# Patient Record
Sex: Female | Born: 1945
Health system: Southern US, Community
[De-identification: ages and names within clinical notes are randomized; demographics above are authoritative.]

## PROBLEM LIST (undated history)

## (undated) DIAGNOSIS — Z9889 Other specified postprocedural states: Secondary | ICD-10-CM

## (undated) DIAGNOSIS — E7800 Pure hypercholesterolemia, unspecified: Secondary | ICD-10-CM

## (undated) DIAGNOSIS — D219 Benign neoplasm of connective and other soft tissue, unspecified: Secondary | ICD-10-CM

## (undated) DIAGNOSIS — R351 Nocturia: Secondary | ICD-10-CM

## (undated) DIAGNOSIS — E079 Disorder of thyroid, unspecified: Secondary | ICD-10-CM

## (undated) DIAGNOSIS — C801 Malignant (primary) neoplasm, unspecified: Secondary | ICD-10-CM

## (undated) DIAGNOSIS — Z972 Presence of dental prosthetic device (complete) (partial): Secondary | ICD-10-CM

## (undated) DIAGNOSIS — D649 Anemia, unspecified: Secondary | ICD-10-CM

## (undated) DIAGNOSIS — I10 Essential (primary) hypertension: Secondary | ICD-10-CM

## (undated) DIAGNOSIS — R112 Nausea with vomiting, unspecified: Secondary | ICD-10-CM

## (undated) HISTORY — DX: Pure hypercholesterolemia, unspecified: E78.00

## (undated) HISTORY — PX: STRABISMUS SURGERY: SHX218

## (undated) HISTORY — DX: Disorder of thyroid, unspecified: E07.9

## (undated) HISTORY — DX: Anemia, unspecified: D64.9

## (undated) HISTORY — PX: DILATION AND CURETTAGE OF UTERUS: SHX78

---

## 1972-10-18 HISTORY — PX: CHOLECYSTECTOMY OPEN: SUR202

## 2004-08-28 ENCOUNTER — Encounter: Admission: RE | Admit: 2004-08-28 | Discharge: 2004-08-28 | Payer: Self-pay | Admitting: Family Medicine

## 2004-11-26 ENCOUNTER — Other Ambulatory Visit: Admission: RE | Admit: 2004-11-26 | Discharge: 2004-11-26 | Payer: Self-pay | Admitting: Family Medicine

## 2006-02-04 ENCOUNTER — Encounter: Admission: RE | Admit: 2006-02-04 | Discharge: 2006-02-04 | Payer: Self-pay | Admitting: Family Medicine

## 2006-03-17 ENCOUNTER — Other Ambulatory Visit: Admission: RE | Admit: 2006-03-17 | Discharge: 2006-03-17 | Payer: Self-pay | Admitting: Family Medicine

## 2007-03-16 ENCOUNTER — Encounter: Admission: RE | Admit: 2007-03-16 | Discharge: 2007-03-16 | Payer: Self-pay | Admitting: Family Medicine

## 2007-03-21 ENCOUNTER — Encounter: Admission: RE | Admit: 2007-03-21 | Discharge: 2007-03-21 | Payer: Self-pay | Admitting: Family Medicine

## 2007-06-09 ENCOUNTER — Other Ambulatory Visit: Admission: RE | Admit: 2007-06-09 | Discharge: 2007-06-09 | Payer: Self-pay | Admitting: Family Medicine

## 2008-08-07 ENCOUNTER — Other Ambulatory Visit: Admission: RE | Admit: 2008-08-07 | Discharge: 2008-08-07 | Payer: Self-pay | Admitting: Family Medicine

## 2010-11-08 ENCOUNTER — Encounter: Payer: Self-pay | Admitting: Family Medicine

## 2013-08-06 ENCOUNTER — Other Ambulatory Visit: Payer: Self-pay | Admitting: Family Medicine

## 2013-08-06 ENCOUNTER — Other Ambulatory Visit (HOSPITAL_COMMUNITY)
Admission: RE | Admit: 2013-08-06 | Discharge: 2013-08-06 | Disposition: A | Discharge status: Home / Self Care | Payer: BC Managed Care – PPO | Source: Ambulatory Visit | Attending: Family Medicine | Admitting: Family Medicine

## 2013-08-06 DIAGNOSIS — Z124 Encounter for screening for malignant neoplasm of cervix: Secondary | ICD-10-CM | POA: Insufficient documentation

## 2016-01-17 ENCOUNTER — Encounter (HOSPITAL_COMMUNITY): Payer: Self-pay | Admitting: Nurse Practitioner

## 2016-01-17 ENCOUNTER — Emergency Department (HOSPITAL_COMMUNITY)
Admission: EM | Admit: 2016-01-17 | Discharge: 2016-01-17 | Disposition: A | Discharge status: Home / Self Care | Payer: BLUE CROSS/BLUE SHIELD | Attending: Physician Assistant | Admitting: Physician Assistant

## 2016-01-17 ENCOUNTER — Emergency Department (HOSPITAL_COMMUNITY): Payer: BLUE CROSS/BLUE SHIELD

## 2016-01-17 DIAGNOSIS — R040 Epistaxis: Secondary | ICD-10-CM | POA: Diagnosis not present

## 2016-01-17 DIAGNOSIS — I10 Essential (primary) hypertension: Secondary | ICD-10-CM | POA: Insufficient documentation

## 2016-01-17 DIAGNOSIS — J988 Other specified respiratory disorders: Secondary | ICD-10-CM | POA: Diagnosis not present

## 2016-01-17 DIAGNOSIS — Z79899 Other long term (current) drug therapy: Secondary | ICD-10-CM | POA: Insufficient documentation

## 2016-01-17 HISTORY — DX: Essential (primary) hypertension: I10

## 2016-01-17 NOTE — ED Provider Notes (Signed)
CSN: [id number]     Arrival date & time 01/17/16  22 History   First MD Initiated Contact with Patient 01/17/16 1731     Chief Complaint  Patient presents with  . Epistaxis   HPI   70 year old female presents today with epistaxis. Patient notes that over the last week she's had influenza infection. She reports she's been seen by her primary care provider twice for this. She notes she was placed on a albuterol inhaler, shortly after starting albuterol she experienced nosebleeds. She reports the first one was last night lasted approximately 10 minutes and discontinue. She noted she had another one this morning, which lasted again approximately 10 minutes and then stopped with recurring bleeds throughout the range of the morning and afternoon. She denies any significant amount of blood loss. She reports bleeding was controlled direct pressure. She denies any abnormal bruising or bleeding anywhere else on her body, she denies any trauma to the nose, history of the same, liver dysfunction, coagulopathies, blood thinners, aspirin, or any other contributing signs or symptoms. Patient reports that symptoms have improved from her upper respiratory infection, noting that she called her primary care provider inform them of the bloody nose who recommended ED evaluation. Patient currently reports she does have a lingering cough, denies any fever, chills, nausea or vomiting, chest pain, shortness of breath, or abdominal pain.    Past Medical History  Diagnosis Date  . Hypertension    Past Surgical History  Procedure Laterality Date  . Cholecystectomy     History reviewed. No pertinent family history. Social History  Substance Use Topics  . Smoking status: Never Smoker   . Smokeless tobacco: None  . Alcohol Use: No   OB History    No data available     Review of Systems  All other systems reviewed and are negative.   Allergies  Simvastatin; Codeine; and Sulfa antibiotics  Home Medications    Prior to Admission medications   Medication Sig Start Date End Date Taking? Authorizing Provider  benazepril-hydrochlorthiazide (LOTENSIN HCT) 20-12.5 MG tablet Take 1 tablet by mouth 2 (two) times daily. 01/06/16  Yes Historical Provider, MD  HYDROMET 5-1.5 MG/5ML syrup Take 5 mLs by mouth daily as needed for cough.  01/13/16  Yes Historical Provider, MD  ibuprofen (ADVIL,MOTRIN) 200 MG tablet Take 400 mg by mouth every 6 (six) hours as needed for moderate pain.   Yes Historical Provider, MD  loperamide (IMODIUM) 2 MG capsule Take 2 mg by mouth as needed for diarrhea or loose stools.   Yes Historical Provider, MD  PROAIR RESPICLICK 123XX123 (90 Base) MCG/ACT AEPB Inhale 2 puffs into the lungs daily as needed. 01/15/16  Yes Historical Provider, MD  promethazine (PHENERGAN) 25 MG tablet Take 25 mg by mouth daily as needed for nausea or vomiting.  01/15/16  Yes Historical Provider, MD   BP 118/71 mmHg  Pulse 72  Temp(Src) 97.9 F (36.6 C) (Oral)  Resp 18  SpO2 99%    Physical Exam  Constitutional: She is oriented to person, place, and time. She appears well-developed and well-nourished.  HENT:  Head: Normocephalic and atraumatic.  Nose: Nose normal. No rhinorrhea, nose lacerations, nasal deformity, septal deviation or nasal septal hematoma. No epistaxis.  No foreign bodies.  Mouth/Throat: Uvula is midline, oropharynx is clear and moist and mucous membranes are normal. No oropharyngeal exudate, posterior oropharyngeal erythema or tonsillar abscesses.  Eyes: Conjunctivae are normal. Pupils are equal, round, and reactive to light. Right eye exhibits no  discharge. Left eye exhibits no discharge. No scleral icterus.  Neck: Normal range of motion. No JVD present. No tracheal deviation present.  Cardiovascular: Normal rate, regular rhythm, normal heart sounds and intact distal pulses.  Exam reveals no gallop and no friction rub.   No murmur heard. Pulmonary/Chest: Effort normal and breath sounds normal.  No stridor. No respiratory distress. She has no wheezes. She has no rales. She exhibits no tenderness.  Neurological: She is alert and oriented to person, place, and time. Coordination normal.  Skin: Skin is warm and dry. No rash noted. No erythema. No pallor.  No noted bruising or bleeding  Psychiatric: She has a normal mood and affect. Her behavior is normal. Judgment and thought content normal.  Nursing note and vitals reviewed.   ED Course  Procedures (including critical care time) Labs Review Labs Reviewed - No data to display  Imaging Review Dg Chest 2 View  01/17/2016  CLINICAL DATA:  Fever, cough, body aches and chills for 5 days, history hypertension EXAM: CHEST  2 VIEW COMPARISON:  None FINDINGS: Normal heart size, mediastinal contours, and pulmonary vascularity. Mild tortuosity of thoracic aorta noted. Central peribronchial thickening. No pulmonary infiltrate, pleural effusion or pneumothorax. No acute osseous findings. IMPRESSION: Mild bronchitic changes without infiltrate. Electronically Signed   By: Lavonia Dana M.D.   On: 01/17/2016 18:39   I have personally reviewed and evaluated these images and lab results as part of my medical decision-making.   EKG Interpretation None      MDM   Final diagnoses:  Epistaxis  Respiratory infection    Labs: No labs indicated  Imaging: DG chest 2 view  Consults:  Therapeutics:  Discharge Meds:   Assessment/Plan: 70 year old female presents today with epistaxis. At the time my evaluation symptoms have stopped. Patient symptoms likely a result from recent upper respiratory infection, in combination without febrile use. Patient has no new signs or symptoms and would indicate any other bleeding disorder, signs of trauma. Patient has no signs of significant blood loss. Patient had normal lung sounds, but oxygen saturation of 94 with recent influenza, chest x-ray to rule out any sort of infectious etiology. No signs of acute infection  on chest x-ray, patient has remained with no bleed while here in the ED. She'll be discharged home with symptomatic care instructions, encouraged to use nasal saline, humidity, Afrin as needed, follow up with primary care if symptoms persist, return to the ED if any concerning signs or symptoms present. Patient verbalized understanding and agreement today's plan had no further questions or concerns at time of discharge        Okey Regal, PA-C 01/17/16 [place], MD 01/17/16 1900

## 2016-01-17 NOTE — Discharge Instructions (Signed)
Nosebleed  Nosebleeds are common. A nosebleed can be caused by many things, including:  · Getting hit hard in the nose.  · Infections.  · Dryness in your nose.  · A dry climate.  · Medicines.  · Picking your nose.  · Your home heating and cooling systems.  HOME CARE   · Try controlling your nosebleed by pinching your nostrils gently. Do this for at least 10 minutes.  · Avoid blowing or sniffing your nose for a number of hours after having a nosebleed.  · Do not put gauze inside of your nose yourself. If your nose was packed by your doctor, try to keep the pack inside of your nose until your doctor removes it.    If a gauze pack was used and it starts to fall out, gently replace it or cut off the end of it.    If a balloon catheter was used to pack your nose, do not cut or remove it unless told by your doctor.  · Avoid lying down while you are having a nosebleed. Sit up and lean forward.  · Use a nasal spray decongestant to help with a nosebleed as told by your doctor.  · Do not use petroleum jelly or mineral oil in your nose. These can drip into your lungs.  · Keep your house humid by using:    Less air conditioning.    A humidifier.  · Aspirin and blood thinners make bleeding more likely. If you are prescribed these medicines and you have nosebleeds, ask your doctor if you should stop taking the medicines or adjust the dose. Do not stop medicines unless told by your doctor.  · Resume your normal activities as you are able. Avoid straining, lifting, or bending at your waist for several days.  · If your nosebleed was caused by dryness in your nose, use over-the-counter saline nasal spray or gel. If you must use a lubricant:    Choose one that is water-soluble.    Use it only as needed.    Do not use it within several hours of lying down.  · Keep all follow-up visits as told by your doctor. This is important.  GET HELP IF:  · You have a fever.  · You get frequent nosebleeds.  · You are getting nosebleeds more  often.  GET HELP RIGHT AWAY IF:  · Your nosebleed lasts longer than 20 minutes.  · Your nosebleed occurs after an injury to your face, and your nose looks crooked or broken.  · You have unusual bleeding from other parts of your body.  · You have unusual bruising on other parts of your body.  · You feel light-headed or dizzy.  · You become sweaty.  · You throw up (vomit) blood.  · You have a nosebleed after a head injury.     This information is not intended to replace advice given to you by your health care provider. Make sure you discuss any questions you have with your health care provider.     Document Released: 07/13/2008 Document Revised: 10/25/2014 Document Reviewed: 05/20/2014  Elsevier Interactive Patient Education ©2016 [employer]

## 2016-01-17 NOTE — ED Notes (Signed)
She c/o 1 week history n/v, anorexia, cough, congestion, fevers, body aches and intermittent nosebleeds since yesterday.  She has seen her PCP twice for these symptoms this week but continues to feel bad.

## 2016-01-17 NOTE — ED Notes (Signed)
Patient transported to X-ray 

## 2017-03-03 ENCOUNTER — Other Ambulatory Visit: Payer: Self-pay | Admitting: Family Medicine

## 2017-03-03 DIAGNOSIS — N6310 Unspecified lump in the right breast, unspecified quadrant: Secondary | ICD-10-CM

## 2017-03-09 ENCOUNTER — Ambulatory Visit
Admission: RE | Admit: 2017-03-09 | Discharge: 2017-03-09 | Disposition: A | Discharge status: Home / Self Care | Payer: PRIVATE HEALTH INSURANCE | Source: Ambulatory Visit | Attending: Family Medicine | Admitting: Family Medicine

## 2017-03-09 DIAGNOSIS — N6310 Unspecified lump in the right breast, unspecified quadrant: Secondary | ICD-10-CM

## 2017-11-04 ENCOUNTER — Encounter (HOSPITAL_COMMUNITY): Payer: Self-pay | Admitting: *Deleted

## 2017-11-04 ENCOUNTER — Inpatient Hospital Stay (HOSPITAL_COMMUNITY)
Admission: AD | Admit: 2017-11-04 | Discharge: 2017-11-04 | Disposition: A | Discharge status: Home / Self Care | Payer: PRIVATE HEALTH INSURANCE | Source: Ambulatory Visit | Attending: Obstetrics and Gynecology | Admitting: Obstetrics and Gynecology

## 2017-11-04 DIAGNOSIS — N950 Postmenopausal bleeding: Secondary | ICD-10-CM | POA: Diagnosis not present

## 2017-11-04 DIAGNOSIS — N939 Abnormal uterine and vaginal bleeding, unspecified: Secondary | ICD-10-CM | POA: Diagnosis not present

## 2017-11-04 DIAGNOSIS — I10 Essential (primary) hypertension: Secondary | ICD-10-CM | POA: Diagnosis not present

## 2017-11-04 HISTORY — DX: Benign neoplasm of connective and other soft tissue, unspecified: D21.9

## 2017-11-04 LAB — URINALYSIS, ROUTINE W REFLEX MICROSCOPIC
Bilirubin Urine: NEGATIVE
Glucose, UA: NEGATIVE mg/dL
Ketones, ur: NEGATIVE mg/dL
Nitrite: NEGATIVE
Protein, ur: 100 mg/dL — AB
Specific Gravity, Urine: 1.019 (ref 1.005–1.030)
Squamous Epithelial / HPF: NONE SEEN
pH: 5 (ref 5.0–8.0)

## 2017-11-04 MED ORDER — MEGESTROL ACETATE 40 MG PO TABS: 40.0000 mg | ORAL_TABLET | Freq: Two times a day (BID) | ORAL | 0 refills | Status: DC

## 2017-11-04 NOTE — MAU Note (Signed)
Pt has been in [facility] for 10-15 years. Last Friday had some bleeding with some clots that stopped . Had a big gush of blood this morning and some abd cramping.

## 2017-11-04 NOTE — MAU Provider Note (Signed)
Chief Complaint: Vaginal Bleeding   First Provider Initiated Contact with Patient 11/04/17 1518      SUBJECTIVE HPI: Connie Simmons is a 72 y.o. G2P2002 not currently pregnant who presents to maternity admissions reporting vaginal bleeding. She is a postmenopausal patient. She reports bleeding last Saturday. She reports waking up to bleeding then it stopped that night- use of one pad last Saturday. Bleeding restarted this morning. She reports gush of blood this morning and continued bleeding with clots this afternoon. She denies abdominal cramping or pain. Currently on pad #2 today. She denies vaginal itching/burning, urinary symptoms, h/a, dizziness, n/v, or fever/chills.  She denies IC in years.   Past Medical History:  Diagnosis Date  . Fibroid   . Hypertension    Past Surgical History:  Procedure Laterality Date  . CHOLECYSTECTOMY    . DILATION AND CURETTAGE OF UTERUS     Social History   Socioeconomic History  . Marital status: Divorced    Spouse name: Not on file  . Number of children: Not on file  . Years of education: Not on file  . Highest education level: Not on file  Social Needs  . Financial resource strain: Not on file  . Food insecurity - worry: Not on file  . Food insecurity - inability: Not on file  . Transportation needs - medical: Not on file  . Transportation needs - non-medical: Not on file  Occupational History  . Not on file  Tobacco Use  . Smoking status: Never Smoker  . Smokeless tobacco: Never Used  Substance and Sexual Activity  . Alcohol use: No  . Drug use: No  . Sexual activity: No  Other Topics Concern  . Not on file  Social History Narrative  . Not on file   No current facility-administered medications on file prior to encounter.    Current Outpatient Medications on File Prior to Encounter  Medication Sig Dispense Refill  . benazepril-hydrochlorthiazide (LOTENSIN HCT) 20-12.5 MG tablet Take 1 tablet by mouth 2 (two) times daily.  1   . HYDROMET 5-1.5 MG/5ML syrup Take 5 mLs by mouth daily as needed for cough.   0  . ibuprofen (ADVIL,MOTRIN) 200 MG tablet Take 400 mg by mouth every 6 (six) hours as needed for moderate pain.    Marland Kitchen loperamide (IMODIUM) 2 MG capsule Take 2 mg by mouth as needed for diarrhea or loose stools.    Marland Kitchen PROAIR RESPICLICK 831 (90 Base) MCG/ACT AEPB Inhale 2 puffs into the lungs daily as needed.  0  . promethazine (PHENERGAN) 25 MG tablet Take 25 mg by mouth daily as needed for nausea or vomiting.   0   Allergies  Allergen Reactions  . Simvastatin Other (See Comments)    Abdominal pain  . Codeine Itching and Rash  . Sulfa Antibiotics Itching and Rash    ROS:  Review of Systems  Constitutional: Negative.   Respiratory: Negative.   Cardiovascular: Negative.   Gastrointestinal: Negative.   Genitourinary: Positive for vaginal bleeding. Negative for difficulty urinating, dysuria, frequency, pelvic pain, urgency and vaginal pain.  Musculoskeletal: Negative.   Neurological: Negative.   Psychiatric/Behavioral: Negative.    I have reviewed patient's Past Medical Hx, Surgical Hx, Family Hx, Social Hx, medications and allergies.   Physical Exam   Patient Vitals for the past 24 hrs:  BP Temp Pulse Resp  11/04/17 1615 121/72 - 70 18  11/04/17 1508 100/72 97.8 F (36.6 C) 73 18   Constitutional: Well-developed, well-nourished female  in no acute distress.  Cardiovascular: normal rate Respiratory: normal effort GI: Abd soft, non-tender. Pos BS x 4 MS: Extremities nontender, no edema, normal ROM Neurologic: Alert and oriented x 4.  GU: Neg CVAT.  PELVIC EXAM: Cervix pink, visually closed, without lesion, small dark red bleeding present around cervix, vaginal walls and external genitalia normal Bimanual exam: Cervix 0/long/high, firm, anterior, neg CMT, uterus nontender, nonenlarged, adnexa without tenderness, enlargement, or mass   LAB RESULTS Results for orders placed or performed during the  hospital encounter of 11/04/17 (from the past 24 hour(s))  Urinalysis, Routine w reflex microscopic     Status: Abnormal   Collection Time: 11/04/17  2:53 PM  Result Value Ref Range   Color, Urine YELLOW YELLOW   APPearance CLOUDY (A) CLEAR   Specific Gravity, Urine 1.019 1.005 - 1.030   pH 5.0 5.0 - 8.0   Glucose, UA NEGATIVE NEGATIVE mg/dL   Hgb urine dipstick LARGE (A) NEGATIVE   Bilirubin Urine NEGATIVE NEGATIVE   Ketones, ur NEGATIVE NEGATIVE mg/dL   Protein, ur 100 (A) NEGATIVE mg/dL   Nitrite NEGATIVE NEGATIVE   Leukocytes, UA MODERATE (A) NEGATIVE   RBC / HPF TOO NUMEROUS TO COUNT 0 - 5 RBC/hpf   WBC, UA TOO NUMEROUS TO COUNT 0 - 5 WBC/hpf   Bacteria, UA RARE (A) NONE SEEN   Squamous Epithelial / LPF NONE SEEN NONE SEEN   Mucus PRESENT     MAU Management/MDM: Orders Placed This Encounter  Procedures  . Urinalysis, Routine w reflex microscopic   Pt discharged with strict bleeding precautions. Discussed plan of care with patient with adding medication to stop bleeding and scheduling patient for outpatient [place]. Patient plans to seek care at [facility] for GYN care due to patient going to [place] for primary care and wants to have [place] at [place]. Called office while in room to set up appointment and [place] with them for upcoming Monday. Rx for Megace given to patient for management of bleeding.   ASSESSMENT 1. Abnormal uterine bleeding (AUB)   2. Post-menopausal bleeding     PLAN Discharge home Rx for Megace given to patient  Follow up as scheduled for [place] on Monday in office  Return to PCP for worsening symptoms   Darrol Poke  Certified Nurse-Midwife 11/04/2017  3:37 PM

## 2017-11-10 ENCOUNTER — Other Ambulatory Visit: Payer: Self-pay | Admitting: Obstetrics & Gynecology

## 2017-11-17 ENCOUNTER — Encounter: Payer: Self-pay | Admitting: Gynecologic Oncology

## 2017-11-17 ENCOUNTER — Inpatient Hospital Stay: Payer: PRIVATE HEALTH INSURANCE | Attending: Gynecologic Oncology | Admitting: Gynecologic Oncology

## 2017-11-17 ENCOUNTER — Inpatient Hospital Stay: Payer: PRIVATE HEALTH INSURANCE

## 2017-11-17 VITALS — BP 127/60 | HR 75 | Temp 98.2°F | Resp 18 | Ht 64.0 in | Wt 226.7 lb

## 2017-11-17 DIAGNOSIS — Z800 Family history of malignant neoplasm of digestive organs: Secondary | ICD-10-CM | POA: Diagnosis not present

## 2017-11-17 DIAGNOSIS — E669 Obesity, unspecified: Secondary | ICD-10-CM

## 2017-11-17 DIAGNOSIS — Z79899 Other long term (current) drug therapy: Secondary | ICD-10-CM | POA: Diagnosis not present

## 2017-11-17 DIAGNOSIS — C541 Malignant neoplasm of endometrium: Secondary | ICD-10-CM

## 2017-11-17 DIAGNOSIS — I10 Essential (primary) hypertension: Secondary | ICD-10-CM | POA: Diagnosis not present

## 2017-11-17 DIAGNOSIS — Z6839 Body mass index (BMI) 39.0-39.9, adult: Secondary | ICD-10-CM | POA: Insufficient documentation

## 2017-11-17 LAB — BASIC METABOLIC PANEL
Anion gap: 11 (ref 3–11)
BUN: 20 mg/dL (ref 7–26)
CO2: 22 mmol/L (ref 22–29)
Calcium: 10.2 mg/dL (ref 8.4–10.4)
Chloride: 106 mmol/L (ref 98–109)
Creatinine, Ser: 0.93 mg/dL (ref 0.60–1.10)
GFR calc Af Amer: >60 mL/min (ref >60)
GFR calc non Af Amer: >60 mL/min (ref >60)
Glucose, Bld: 86 mg/dL (ref 70–140)
Potassium: 4.6 mmol/L (ref 3.5–5.1)
Sodium: 139 mmol/L (ref 136–145)

## 2017-11-17 MED ORDER — MEGESTROL ACETATE 40 MG PO TABS: 40.0000 mg | ORAL_TABLET | Freq: Two times a day (BID) | ORAL | 0 refills | Status: DC

## 2017-11-17 NOTE — Patient Instructions (Signed)
Plan on having a CT scan of the abdomen and pelvis.  We will contact you with the results.                Preparing for your Surgery  Plan for surgery on December 01, 2017 with Dr. Everitt Amber at [place] will be scheduled for a robotic assisted total laparoscopic hysterectomy, bilateral salpingo-oophorectomy, sentinel lymph node.  Pre-operative Testing -You will receive a phone call from presurgical testing at [facility] to arrange for a pre-operative testing appointment before your surgery.  This appointment normally occurs one to two weeks before your scheduled surgery.   -Bring your insurance card, copy of an advanced directive if applicable, medication list  -At that visit, you will be asked to sign a consent for a possible blood transfusion in case a transfusion becomes necessary during surgery.  The need for a blood transfusion is rare but having consent is a necessary part of your care.    -You should not be taking blood thinners or aspirin at least ten days prior to surgery unless instructed by your surgeon.  Day Before Surgery at [place] will be asked to take in a light diet the day before surgery.  Avoid carbonated beverages.  You will be advised to have nothing to eat or drink after midnight the evening before.    Eat a light diet the day before surgery.  Examples including soups, broths, toast, yogurt, mashed potatoes.  Things to avoid include carbonated beverages (fizzy beverages), raw fruits and raw vegetables, or beans.   If your bowels are filled with gas, your surgeon will have difficulty visualizing your pelvic organs which increases your surgical risks.  Your role in recovery Your role is to become active as soon as directed by your doctor, while still giving yourself time to heal.  Rest when you feel tired. You will be asked to do the following in order to speed your recovery:  - Cough and breathe deeply. This helps toclear and expand your  lungs and can prevent pneumonia. You may be given a spirometer to practice deep breathing. A staff member will show you how to use the spirometer. - Do mild physical activity. Walking or moving your legs help your circulation and body functions return to normal. A staff member will help you when you try to walk and will provide you with simple exercises. Do not try to get up or walk alone the first time. - Actively manage your pain. Managing your pain lets you move in comfort. We will ask you to rate your pain on a scale of zero to 10. It is your responsibility to tell your doctor or nurse where and how much you hurt so your pain can be treated.  Special Considerations -If you are diabetic, you may be placed on insulin after surgery to have closer control over your blood sugars to promote healing and recovery.  This does not mean that you will be discharged on insulin.  If applicable, your oral antidiabetics will be resumed when you are tolerating a solid diet.  -Your final pathology results from surgery should be available by the Friday after surgery and the results will be relayed to you when available.  -Dr. Lahoma Crocker is the Surgeon that assists your GYN Oncologist with surgery.  The next day after your surgery you will either see your GYN Oncologist or Dr. Lahoma Crocker.   Blood Transfusion Information WHAT IS A BLOOD TRANSFUSION? A  transfusion is the replacement of blood or some of its parts. Blood is made up of multiple cells which provide different functions.  Red blood cells carry oxygen and are used for blood loss replacement.  White blood cells fight against infection.  Platelets control bleeding.  Plasma helps clot blood.  Other blood products are available for specialized needs, such as hemophilia or other clotting disorders. BEFORE THE TRANSFUSION  Who gives blood for transfusions?   You may be able to donate blood to be used at a later date on yourself  (autologous donation).  Relatives can be asked to donate blood. This is generally not any safer than if you have received blood from a stranger. The same precautions are taken to ensure safety when a relative's blood is donated.  Healthy volunteers who are fully evaluated to make sure their blood is safe. This is blood bank blood. Transfusion therapy is the safest it has ever been in the practice of medicine. Before blood is taken from a donor, a complete history is taken to make sure that person has no history of diseases nor engages in risky social behavior (examples are intravenous drug use or sexual activity with multiple partners). The donor's travel history is screened to minimize risk of transmitting infections, such as malaria. The donated blood is tested for signs of infectious diseases, such as HIV and hepatitis. The blood is then tested to be sure it is compatible with you in order to minimize the chance of a transfusion reaction. If you or a relative donates blood, this is often done in anticipation of surgery and is not appropriate for emergency situations. It takes many days to process the donated blood. RISKS AND COMPLICATIONS Although transfusion therapy is very safe and saves many lives, the main dangers of transfusion include:   Getting an infectious disease.  Developing a transfusion reaction. This is an allergic reaction to something in the blood you were given. Every precaution is taken to prevent this. The decision to have a blood transfusion has been considered carefully by your caregiver before blood is given. Blood is not given unless the benefits outweigh the risks.

## 2017-11-17 NOTE — H&P (View-Only) (Signed)
Consult Note: Gyn-Onc  Consult was requested by Dr. Nelda Marseille for the evaluation of Connie Simmons 72 y.o. female  CC:  Chief Complaint  Patient presents with  . endometrial cancer    Assessment/Plan:  Connie Simmons  is a 72 y.o.  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  A detailed discussion was held with the patient and her family with regard to to her endometrial cancer diagnosis. We discussed the standard management options for uterine cancer which includes surgery followed possibly by adjuvant therapy depending on the results of surgery. The options for surgical management include a hysterectomy and removal of the tubes and ovaries possibly with removal of pelvic and para-aortic lymph nodes.If feasible, a minimally invasive approach including a robotic hysterectomy or laparoscopic hysterectomy have benefits including shorter hospital stay, recovery time and better wound healing than with open surgery. The patient has been counseled about these surgical options and the risks of surgery in general including infection, bleeding, damage to surrounding structures (including bowel, bladder, ureters, nerves or vessels), and the postoperative risks of PE/ DVT, and lymphedema. I extensively reviewed the additional risks of robotic hysterectomy including possible need for conversion to open laparotomy.  I discussed positioning during surgery of trendelenberg and risks of minor facial swelling and care we take in preoperative positioning.  After counseling and consideration of her options, she desires to proceed with robotic assisted total hysterectomy with bilateral sapingo-oophorectomy and SLN biopsy.   I discussed that her obesity places her at increased risk for complications.  I discussed that the high grade nature of her pathology places her at increased risk for metastatic disease and therefore in accordance with NCCN guidelines, we will order a preoperative CT  scan. I discussed that due to this high grade histology, adjuvant therapy will likely be prescribed.  She will be seen by anesthesia for preoperative clearance and discussion of postoperative pain management.  She was given the opportunity to ask questions, which were answered to her satisfaction, and she is agreement with the above mentioned plan of care.  Continue megace until surgery.  HPI: Connie Simmons is a 72 year old P2 who is seen in consultation at the request of Dr Nelda Marseille for serous endometrial cancer in the setting of obesity (BMI 39kg/m2).  The patient experienced vaginal bleeding for approximately 6 months though initially it was only light spotting. In January, 2019 it became heavier. She was seen at [facility] on 11/04/17 and prescribed Megace. She was then seen by Dr Nelda Marseille and an endometrial biopsy was performed on 11/10/17 which showed high grade serous endometrial cancer. TVUS showed a uterus measuring 6.3x11.2x7cm. The endometrium was 4cm thick.   The patient has a family history of a grandfather with colon cancer. She has had 2 prior SVD's and an open cholecystectomy.   Current Meds:  Outpatient Encounter Medications as of 11/17/2017  Medication Sig  . benazepril-hydrochlorthiazide (LOTENSIN HCT) 20-12.5 MG tablet Take 1 tablet by mouth 2 (two) times daily.  Marland Kitchen ibuprofen (ADVIL,MOTRIN) 200 MG tablet Take 400 mg by mouth every 6 (six) hours as needed for moderate pain.  . megestrol (MEGACE) 40 MG tablet Take 1 tablet (40 mg total) by mouth 2 (two) times daily. Can increase to two tablets twice a day in the event of heavy bleeding  . Multiple Vitamin (MULTIVITAMIN WITH MINERALS) TABS tablet Take 1 tablet by mouth daily.  Marland Kitchen PROAIR RESPICLICK 017 (90 Base) MCG/ACT AEPB Inhale 2 puffs into  the lungs daily as needed.   No facility-administered encounter medications on file as of 11/17/2017.     Allergy:  Allergies  Allergen Reactions  . Simvastatin Other (See  Comments)    Abdominal pain  . Codeine Itching and Rash  . Sulfa Antibiotics Itching and Rash    Social Hx:   Social History   Socioeconomic History  . Marital status: Divorced    Spouse name: Not on file  . Number of children: Not on file  . Years of education: Not on file  . Highest education level: Not on file  Social Needs  . Financial resource strain: Not on file  . Food insecurity - worry: Not on file  . Food insecurity - inability: Not on file  . Transportation needs - medical: Not on file  . Transportation needs - non-medical: Not on file  Occupational History  . Not on file  Tobacco Use  . Smoking status: Never Smoker  . Smokeless tobacco: Never Used  Substance and Sexual Activity  . Alcohol use: No  . Drug use: No  . Sexual activity: No  Other Topics Concern  . Not on file  Social History Narrative  . Not on file    Past Surgical Hx:  Past Surgical History:  Procedure Laterality Date  . CHOLECYSTECTOMY    . DILATION AND CURETTAGE OF UTERUS      Past Medical Hx:  Past Medical History:  Diagnosis Date  . Fibroid   . Hypertension     Past Gynecological History:  SVD x 2, fibroids No LMP recorded. Patient is postmenopausal.  Family Hx: No family history on file.  Review of Systems:  Constitutional  Feels well,    ENT Normal appearing ears and nares bilaterally Skin/Breast  No rash, sores, jaundice, itching, dryness Cardiovascular  No chest pain, shortness of breath, or edema  Pulmonary  No cough or wheeze.  Gastro Intestinal  No nausea, vomitting, or diarrhoea. No bright red blood per rectum, no abdominal pain, change in bowel movement, or constipation.  Genito Urinary  No frequency, urgency, dysuria, + heavy vaginal bleeding Musculo Skeletal  No myalgia, arthralgia, joint swelling or pain  Neurologic  No weakness, numbness, change in gait,  Psychology  No depression, anxiety, insomnia.   Vitals:  Blood pressure 127/60, pulse 75,  temperature 98.2 F (36.8 C), temperature source Oral, resp. rate 18, height 5\' 4"  (1.626 m), weight 226 lb 11.2 oz (102.8 kg), SpO2 100 %.  Physical Exam: WD in NAD Neck  Supple NROM, without any enlargements.  Lymph Node Survey No cervical supraclavicular or inguinal adenopathy Cardiovascular  Pulse normal rate, regularity and rhythm. S1 and S2 normal.  Lungs  Clear to auscultation bilateraly, without wheezes/crackles/rhonchi. Good air movement.  Skin  No rash/lesions/breakdown  Psychiatry  Alert and oriented to person, place, and time  Abdomen  Normoactive bowel sounds, abdomen soft, non-tender and obese without evidence of hernia.  Back No CVA tenderness Genito Urinary  Vulva/vagina: Normal external female genitalia.  No lesions. No discharge or bleeding.  Bladder/urethra:  No lesions or masses, well supported bladder  Vagina: normal  Cervix: Normal appearing, no lesions.  Uterus: slightly bulky, mobile, no parametrial involvement or nodularity.  Adnexa: no discrete  masses. Rectal  deferred Extremities  No bilateral cyanosis, clubbing or edema.   Donaciano Eva, MD  11/17/2017, 10:39 AM

## 2017-11-17 NOTE — Progress Notes (Signed)
Consult Note: Gyn-Onc  Consult was requested by Dr. Nelda Marseille for the evaluation of Connie Simmons 72 y.o. female  CC:  Chief Complaint  Patient presents with  . endometrial cancer    Assessment/Plan:  Connie Simmons  is a 72 y.o.  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  A detailed discussion was held with the patient and her family with regard to to her endometrial cancer diagnosis. We discussed the standard management options for uterine cancer which includes surgery followed possibly by adjuvant therapy depending on the results of surgery. The options for surgical management include a hysterectomy and removal of the tubes and ovaries possibly with removal of pelvic and para-aortic lymph nodes.If feasible, a minimally invasive approach including a robotic hysterectomy or laparoscopic hysterectomy have benefits including shorter hospital stay, recovery time and better wound healing than with open surgery. The patient has been counseled about these surgical options and the risks of surgery in general including infection, bleeding, damage to surrounding structures (including bowel, bladder, ureters, nerves or vessels), and the postoperative risks of PE/ DVT, and lymphedema. I extensively reviewed the additional risks of robotic hysterectomy including possible need for conversion to open laparotomy.  I discussed positioning during surgery of trendelenberg and risks of minor facial swelling and care we take in preoperative positioning.  After counseling and consideration of her options, she desires to proceed with robotic assisted total hysterectomy with bilateral sapingo-oophorectomy and SLN biopsy.   I discussed that her obesity places her at increased risk for complications.  I discussed that the high grade nature of her pathology places her at increased risk for metastatic disease and therefore in accordance with NCCN guidelines, we will order a preoperative CT  scan. I discussed that due to this high grade histology, adjuvant therapy will likely be prescribed.  She will be seen by anesthesia for preoperative clearance and discussion of postoperative pain management.  She was given the opportunity to ask questions, which were answered to her satisfaction, and she is agreement with the above mentioned plan of care.  Continue megace until surgery.  HPI: Connie Simmons is a 72 year old P2 who is seen in consultation at the request of Dr Nelda Marseille for serous endometrial cancer in the setting of obesity (BMI 39kg/m2).  The patient experienced vaginal bleeding for approximately 6 months though initially it was only light spotting. In January, 2019 it became heavier. She was seen at [facility] on 11/04/17 and prescribed Megace. She was then seen by Dr Nelda Marseille and an endometrial biopsy was performed on 11/10/17 which showed high grade serous endometrial cancer. TVUS showed a uterus measuring 6.3x11.2x7cm. The endometrium was 4cm thick.   The patient has a family history of a grandfather with colon cancer. She has had 2 prior SVD's and an open cholecystectomy.   Current Meds:  Outpatient Encounter Medications as of 11/17/2017  Medication Sig  . benazepril-hydrochlorthiazide (LOTENSIN HCT) 20-12.5 MG tablet Take 1 tablet by mouth 2 (two) times daily.  Marland Kitchen ibuprofen (ADVIL,MOTRIN) 200 MG tablet Take 400 mg by mouth every 6 (six) hours as needed for moderate pain.  . megestrol (MEGACE) 40 MG tablet Take 1 tablet (40 mg total) by mouth 2 (two) times daily. Can increase to two tablets twice a day in the event of heavy bleeding  . Multiple Vitamin (MULTIVITAMIN WITH MINERALS) TABS tablet Take 1 tablet by mouth daily.  Marland Kitchen PROAIR RESPICLICK 202 (90 Base) MCG/ACT AEPB Inhale 2 puffs into  the lungs daily as needed.   No facility-administered encounter medications on file as of 11/17/2017.     Allergy:  Allergies  Allergen Reactions  . Simvastatin Other (See  Comments)    Abdominal pain  . Codeine Itching and Rash  . Sulfa Antibiotics Itching and Rash    Social Hx:   Social History   Socioeconomic History  . Marital status: Divorced    Spouse name: Not on file  . Number of children: Not on file  . Years of education: Not on file  . Highest education level: Not on file  Social Needs  . Financial resource strain: Not on file  . Food insecurity - worry: Not on file  . Food insecurity - inability: Not on file  . Transportation needs - medical: Not on file  . Transportation needs - non-medical: Not on file  Occupational History  . Not on file  Tobacco Use  . Smoking status: Never Smoker  . Smokeless tobacco: Never Used  Substance and Sexual Activity  . Alcohol use: No  . Drug use: No  . Sexual activity: No  Other Topics Concern  . Not on file  Social History Narrative  . Not on file    Past Surgical Hx:  Past Surgical History:  Procedure Laterality Date  . CHOLECYSTECTOMY    . DILATION AND CURETTAGE OF UTERUS      Past Medical Hx:  Past Medical History:  Diagnosis Date  . Fibroid   . Hypertension     Past Gynecological History:  SVD x 2, fibroids No LMP recorded. Patient is postmenopausal.  Family Hx: No family history on file.  Review of Systems:  Constitutional  Feels well,    ENT Normal appearing ears and nares bilaterally Skin/Breast  No rash, sores, jaundice, itching, dryness Cardiovascular  No chest pain, shortness of breath, or edema  Pulmonary  No cough or wheeze.  Gastro Intestinal  No nausea, vomitting, or diarrhoea. No bright red blood per rectum, no abdominal pain, change in bowel movement, or constipation.  Genito Urinary  No frequency, urgency, dysuria, + heavy vaginal bleeding Musculo Skeletal  No myalgia, arthralgia, joint swelling or pain  Neurologic  No weakness, numbness, change in gait,  Psychology  No depression, anxiety, insomnia.   Vitals:  Blood pressure 127/60, pulse 75,  temperature 98.2 F (36.8 C), temperature source Oral, resp. rate 18, height 5\' 4"  (1.626 m), weight 226 lb 11.2 oz (102.8 kg), SpO2 100 %.  Physical Exam: WD in NAD Neck  Supple NROM, without any enlargements.  Lymph Node Survey No cervical supraclavicular or inguinal adenopathy Cardiovascular  Pulse normal rate, regularity and rhythm. S1 and S2 normal.  Lungs  Clear to auscultation bilateraly, without wheezes/crackles/rhonchi. Good air movement.  Skin  No rash/lesions/breakdown  Psychiatry  Alert and oriented to person, place, and time  Abdomen  Normoactive bowel sounds, abdomen soft, non-tender and obese without evidence of hernia.  Back No CVA tenderness Genito Urinary  Vulva/vagina: Normal external female genitalia.  No lesions. No discharge or bleeding.  Bladder/urethra:  No lesions or masses, well supported bladder  Vagina: normal  Cervix: Normal appearing, no lesions.  Uterus: slightly bulky, mobile, no parametrial involvement or nodularity.  Adnexa: no discrete  masses. Rectal  deferred Extremities  No bilateral cyanosis, clubbing or edema.   Donaciano Eva, MD  11/17/2017, 10:39 AM

## 2017-11-22 ENCOUNTER — Telehealth: Payer: Self-pay | Admitting: *Deleted

## 2017-11-22 NOTE — Telephone Encounter (Signed)
Returned the patients call and left a message for the machine. Left the following information, "you can drop off you FMLA paperwork to the front desk with Ms. Wilma in an envelope marked GYN/ONC. Once we fill out the papers we will call you to come pick them up."

## 2017-11-23 ENCOUNTER — Encounter (HOSPITAL_COMMUNITY): Payer: Self-pay

## 2017-11-23 ENCOUNTER — Ambulatory Visit (HOSPITAL_COMMUNITY)
Admission: RE | Admit: 2017-11-23 | Discharge: 2017-11-23 | Disposition: A | Discharge status: Home / Self Care | Payer: No Typology Code available for payment source | Source: Ambulatory Visit | Attending: Gynecologic Oncology | Admitting: Gynecologic Oncology

## 2017-11-23 DIAGNOSIS — C541 Malignant neoplasm of endometrium: Secondary | ICD-10-CM

## 2017-11-23 HISTORY — DX: Malignant (primary) neoplasm, unspecified: C80.1

## 2017-11-23 MED ORDER — IOPAMIDOL (ISOVUE-300) INJECTION 61%
INTRAVENOUS | Status: AC
  Filled 2017-11-23: qty 100

## 2017-11-23 MED ORDER — SODIUM CHLORIDE 0.9 % IJ SOLN
INTRAMUSCULAR | Status: AC
  Filled 2017-11-23: qty 50

## 2017-11-23 MED ORDER — IOPAMIDOL (ISOVUE-300) INJECTION 61%
100.0000 mL | Freq: Once | INTRAVENOUS | Status: AC | PRN
  Administered 2017-11-23: 100 mL via INTRAVENOUS

## 2017-11-24 ENCOUNTER — Telehealth: Payer: Self-pay | Admitting: Gynecologic Oncology

## 2017-11-24 DIAGNOSIS — C801 Malignant (primary) neoplasm, unspecified: Secondary | ICD-10-CM

## 2017-11-24 DIAGNOSIS — C541 Malignant neoplasm of endometrium: Secondary | ICD-10-CM | POA: Insufficient documentation

## 2017-11-24 DIAGNOSIS — C786 Secondary malignant neoplasm of retroperitoneum and peritoneum: Secondary | ICD-10-CM

## 2017-11-24 NOTE — Patient Instructions (Addendum)
Connie Simmons  11/24/2017   Your procedure is scheduled on: 12/01/2017   Report to [facility] Main  Entrance  Report to admitting at  0930 AM   Call this number if you have problems the morning of surgery [phone]   Remember: Do not eat food or drink liquids :After Midnight. Eat a lgiht diet the day before surgery.  Examples include soup, broth, toast,a yogurt and mashed potatoes.  Things to avoid include carbonated beverages, raw fruits and vegetables and beans.       Take these medicines the morning of surgery with A SIP OF WATER:  Megestrol, Flonase if needed                                 You may not have any metal on your body including hair pins and              piercings  Do not wear jewelry, make-up, lotions, powders or perfumes, deodorant             Do not wear nail polish.  Do not shave  48 hours prior to surgery.              Do not bring valuables to the hospital. [place].  Contacts, dentures or bridgework may not be worn into surgery.  Leave suitcase in the car. After surgery it may be brought to your room.         Special Instructions: coughing and deep breathing exercises, leg exercises               Please read over the following fact sheets you were given: _____________________________________________________________________             [facility] - Preparing for Surgery Before surgery, you can play an important role.  Because skin is not sterile, your skin needs to be as free of germs as possible.  You can reduce the number of germs on your skin by washing with CHG (chlorahexidine gluconate) soap before surgery.  CHG is an antiseptic cleaner which kills germs and bonds with the skin to continue killing germs even after washing. Please DO NOT use if you have an allergy to CHG or antibacterial soaps.  If your skin becomes reddened/irritated stop using the CHG and inform your  nurse when you arrive at Short Stay. Do not shave (including legs and underarms) for at least 48 hours prior to the first CHG shower.  You may shave your face/neck. Please follow these instructions carefully:  1.  Shower with CHG Soap the night before surgery and the  morning of Surgery.  2.  If you choose to wash your hair, wash your hair first as usual with your  normal  shampoo.  3.  After you shampoo, rinse your hair and body thoroughly to remove the  shampoo.                           4.  Use CHG as you would any other liquid soap.  You can apply chg directly  to the skin and wash  Gently with a scrungie or clean washcloth.  5.  Apply the CHG Soap to your body ONLY FROM THE NECK DOWN.   Do not use on face/ open                           Wound or open sores. Avoid contact with eyes, ears mouth and genitals (private parts).                       Wash face,  Genitals (private parts) with your normal soap.             6.  Wash thoroughly, paying special attention to the area where your surgery  will be performed.  7.  Thoroughly rinse your body with warm water from the neck down.  8.  DO NOT shower/wash with your normal soap after using and rinsing off  the CHG Soap.                9.  Pat yourself dry with a clean towel.            10.  Wear clean pajamas.            11.  Place clean sheets on your bed the night of your first shower and do not  sleep with pets. Day of Surgery : Do not apply any lotions/deodorants the morning of surgery.  Please wear clean clothes to the hospital/surgery center.  FAILURE TO FOLLOW THESE INSTRUCTIONS MAY RESULT IN THE CANCELLATION OF YOUR SURGERY PATIENT SIGNATURE_________________________________  NURSE SIGNATURE__________________________________  ________________________________________________________________________  WHAT IS A BLOOD TRANSFUSION? Blood Transfusion Information  A transfusion is the replacement of blood or some of its  parts. Blood is made up of multiple cells which provide different functions.  Red blood cells carry oxygen and are used for blood loss replacement.  White blood cells fight against infection.  Platelets control bleeding.  Plasma helps clot blood.  Other blood products are available for specialized needs, such as hemophilia or other clotting disorders. BEFORE THE TRANSFUSION  Who gives blood for transfusions?   Healthy volunteers who are fully evaluated to make sure their blood is safe. This is blood bank blood. Transfusion therapy is the safest it has ever been in the practice of medicine. Before blood is taken from a donor, a complete history is taken to make sure that person has no history of diseases nor engages in risky social behavior (examples are intravenous drug use or sexual activity with multiple partners). The donor's travel history is screened to minimize risk of transmitting infections, such as malaria. The donated blood is tested for signs of infectious diseases, such as HIV and hepatitis. The blood is then tested to be sure it is compatible with you in order to minimize the chance of a transfusion reaction. If you or a relative donates blood, this is often done in anticipation of surgery and is not appropriate for emergency situations. It takes many days to process the donated blood. RISKS AND COMPLICATIONS Although transfusion therapy is very safe and saves many lives, the main dangers of transfusion include:   Getting an infectious disease.  Developing a transfusion reaction. This is an allergic reaction to something in the blood you were given. Every precaution is taken to prevent this. The decision to have a blood transfusion has been considered carefully by your caregiver before blood is given. Blood is not given unless the benefits outweigh  the risks. AFTER THE TRANSFUSION  Right after receiving a blood transfusion, you will usually feel much better and more energetic.  This is especially true if your red blood cells have gotten low (anemic). The transfusion raises the level of the red blood cells which carry oxygen, and this usually causes an energy increase.  The nurse administering the transfusion will monitor you carefully for complications. HOME CARE INSTRUCTIONS  No special instructions are needed after a transfusion. You may find your energy is better. Speak with your caregiver about any limitations on activity for underlying diseases you may have. SEEK MEDICAL CARE IF:   Your condition is not improving after your transfusion.  You develop redness or irritation at the intravenous (IV) site. SEEK IMMEDIATE MEDICAL CARE IF:  Any of the following symptoms occur over the next 12 hours:  Shaking chills.  You have a temperature by mouth above 102 F (38.9 C), not controlled by medicine.  Chest, back, or muscle pain.  People around you feel you are not acting correctly or are confused.  Shortness of breath or difficulty breathing.  Dizziness and fainting.  You get a rash or develop hives.  You have a decrease in urine output.  Your urine turns a dark color or changes to pink, red, or brown. Any of the following symptoms occur over the next 10 days:  You have a temperature by mouth above 102 F (38.9 C), not controlled by medicine.  Shortness of breath.  Weakness after normal activity.  The white part of the eye turns yellow (jaundice).  You have a decrease in the amount of urine or are urinating less often.  Your urine turns a dark color or changes to pink, red, or brown. Document Released: 10/01/2000 Document Revised: 12/27/2011 Document Reviewed: 05/20/2008 ExitCare Patient Information 2014 [place].  _______________________________________________________________________  Incentive Spirometer  An incentive spirometer is a tool that can help keep your lungs clear and active. This tool measures how well you are filling  your lungs with each breath. Taking long deep breaths may help reverse or decrease the chance of developing breathing (pulmonary) problems (especially infection) following:  A long period of time when you are unable to move or be active. BEFORE THE PROCEDURE   If the spirometer includes an indicator to show your best effort, your nurse or respiratory therapist will set it to a desired goal.  If possible, sit up straight or lean slightly forward. Try not to slouch.  Hold the incentive spirometer in an upright position. INSTRUCTIONS FOR USE  1. Sit on the edge of your bed if possible, or sit up as far as you can in bed or on a chair. 2. Hold the incentive spirometer in an upright position. 3. Breathe out normally. 4. Place the mouthpiece in your mouth and seal your lips tightly around it. 5. Breathe in slowly and as deeply as possible, raising the piston or the ball toward the top of the column. 6. Hold your breath for 3-5 seconds or for as long as possible. Allow the piston or ball to fall to the bottom of the column. 7. Remove the mouthpiece from your mouth and breathe out normally. 8. Rest for a few seconds and repeat Steps 1 through 7 at least 10 times every 1-2 hours when you are awake. Take your time and take a few normal breaths between deep breaths. 9. The spirometer may include an indicator to show your best effort. Use the indicator as a goal to work  toward during each repetition. 10. After each set of 10 deep breaths, practice coughing to be sure your lungs are clear. If you have an incision (the cut made at the time of surgery), support your incision when coughing by placing a pillow or rolled up towels firmly against it. Once you are able to get out of bed, walk around indoors and cough well. You may stop using the incentive spirometer when instructed by your caregiver.  RISKS AND COMPLICATIONS  Take your time so you do not get dizzy or light-headed.  If you are in pain, you may  need to take or ask for pain medication before doing incentive spirometry. It is harder to take a deep breath if you are having pain. AFTER USE  Rest and breathe slowly and easily.  It can be helpful to keep track of a log of your progress. Your caregiver can provide you with a simple table to help with this. If you are using the spirometer at home, follow these instructions: [place] IF:   You are having difficultly using the spirometer.  You have trouble using the spirometer as often as instructed.  Your pain medication is not giving enough relief while using the spirometer.  You develop fever of 100.5 F (38.1 C) or higher. SEEK IMMEDIATE MEDICAL CARE IF:   You cough up bloody sputum that had not been present before.  You develop fever of 102 F (38.9 C) or greater.  You develop worsening pain at or near the incision site. MAKE SURE YOU:   Understand these instructions.  Will watch your condition.  Will get help right away if you are not doing well or get worse. Document Released: 02/14/2007 Document Revised: 12/27/2011 Document Reviewed: 04/17/2007 [facility] Patient Information 2014 [place], [place].   ________________________________________________________________________

## 2017-11-24 NOTE — Telephone Encounter (Signed)
Informed patient of CT findings of peritoneal metastases. Discussed that we will still proceed with hysterectomy, and will biopsy the peritoneal nodules to confirm stage IV disease. I discussed that this means that she will certainly require adjuvant chemotherapy postop. We will move to schedule a postop appointment with medical oncology now so that we can proceed without delay postop with chemotherapy when she has recovered from surgery.  Donaciano Eva, MD

## 2017-11-25 ENCOUNTER — Other Ambulatory Visit: Payer: Self-pay

## 2017-11-25 ENCOUNTER — Encounter (HOSPITAL_COMMUNITY): Payer: Self-pay

## 2017-11-25 ENCOUNTER — Encounter (HOSPITAL_COMMUNITY)
Admission: RE | Admit: 2017-11-25 | Discharge: 2017-11-25 | Disposition: A | Discharge status: Home / Self Care | Payer: PRIVATE HEALTH INSURANCE | Source: Ambulatory Visit | Attending: Gynecologic Oncology | Admitting: Gynecologic Oncology

## 2017-11-25 DIAGNOSIS — Z01818 Encounter for other preprocedural examination: Secondary | ICD-10-CM | POA: Insufficient documentation

## 2017-11-25 DIAGNOSIS — C541 Malignant neoplasm of endometrium: Secondary | ICD-10-CM | POA: Insufficient documentation

## 2017-11-25 HISTORY — DX: Nocturia: R35.1

## 2017-11-25 HISTORY — DX: Other specified postprocedural states: Z98.890

## 2017-11-25 HISTORY — DX: Nausea with vomiting, unspecified: R11.2

## 2017-11-25 HISTORY — DX: Presence of dental prosthetic device (complete) (partial): Z97.2

## 2017-11-25 LAB — COMPREHENSIVE METABOLIC PANEL
ALT: 21 U/L (ref 14–54)
AST: 21 U/L (ref 15–41)
Albumin: 4 g/dL (ref 3.5–5.0)
Alkaline Phosphatase: 74 U/L (ref 38–126)
Anion gap: 9 (ref 5–15)
BUN: 17 mg/dL (ref 6–20)
CO2: 23 mmol/L (ref 22–32)
Calcium: 9.5 mg/dL (ref 8.9–10.3)
Chloride: 103 mmol/L (ref 101–111)
Creatinine, Ser: 1 mg/dL (ref 0.44–1.00)
GFR calc Af Amer: >60 mL/min (ref >60)
GFR calc non Af Amer: 56 mL/min — ABNORMAL LOW (ref >60)
Glucose, Bld: 88 mg/dL (ref 65–99)
Potassium: 3.9 mmol/L (ref 3.5–5.1)
Sodium: 135 mmol/L (ref 135–145)
Total Bilirubin: 0.2 mg/dL — ABNORMAL LOW (ref 0.3–1.2)
Total Protein: 7.5 g/dL (ref 6.5–8.1)

## 2017-11-25 LAB — CBC
HCT: 31.5 % — ABNORMAL LOW (ref 36.0–46.0)
Hemoglobin: 10.4 g/dL — ABNORMAL LOW (ref 12.0–15.0)
MCH: 26.9 pg (ref 26.0–34.0)
MCHC: 33 g/dL (ref 30.0–36.0)
MCV: 81.6 fL (ref 78.0–100.0)
Platelets: 396 10*3/uL (ref 150–400)
RBC: 3.86 MIL/uL — ABNORMAL LOW (ref 3.87–5.11)
RDW: 14.2 % (ref 11.5–15.5)
WBC: 7.3 10*3/uL (ref 4.0–10.5)

## 2017-11-25 LAB — URINALYSIS, ROUTINE W REFLEX MICROSCOPIC
Bilirubin Urine: NEGATIVE
Glucose, UA: NEGATIVE mg/dL
Ketones, ur: 5 mg/dL — AB
Nitrite: NEGATIVE
Protein, ur: NEGATIVE mg/dL
Specific Gravity, Urine: 1.011 (ref 1.005–1.030)
pH: 5 (ref 5.0–8.0)

## 2017-11-25 LAB — ABO/RH: ABO/RH(D): O POS

## 2017-11-25 NOTE — Progress Notes (Signed)
U/A done 11/25/17 routed via epic to [employer].

## 2017-11-25 NOTE — Telephone Encounter (Signed)
I saw her appt with Dr. Denman George is on 3/13. That is more than 1 month away. Is that correct? I can see her that day at 1115 am, 45 mins

## 2017-11-25 NOTE — Progress Notes (Addendum)
Routed UA results dated 11-25-2017 to dr Denman George in epic.

## 2017-11-26 LAB — URINE CULTURE: Culture: 30000 — AB

## 2017-11-28 ENCOUNTER — Telehealth: Payer: Self-pay | Admitting: *Deleted

## 2017-11-28 NOTE — Telephone Encounter (Signed)
Returned the patient call and left a message to call the office back.

## 2017-11-29 NOTE — Telephone Encounter (Signed)
Told Ms Restivo that her FMLA forms were faxed to her Employer. A copy is up front with Ms Regino Schultze to be p/u by family on 12-01-17.

## 2017-11-30 ENCOUNTER — Telehealth: Payer: Self-pay

## 2017-11-30 DIAGNOSIS — N390 Urinary tract infection, site not specified: Secondary | ICD-10-CM

## 2017-11-30 MED ORDER — NITROFURANTOIN MONOHYD MACRO 100 MG PO CAPS: 100.0000 mg | ORAL_CAPSULE | Freq: Two times a day (BID) | ORAL | 0 refills | Status: DC

## 2017-11-30 NOTE — Telephone Encounter (Signed)
Told Connie Simmons that her urine culture showed that she had a UTI. Joylene John, NP wants her to begin Macrobid 100 mg bid today. Take one tablet in the am with a few sips of water per [name]. Joylene John, NP said not to take her Megace in the am. Prior to her surgery.  Pt verbalized understanding.

## 2017-12-01 ENCOUNTER — Ambulatory Visit (HOSPITAL_COMMUNITY): Payer: No Typology Code available for payment source | Admitting: Certified Registered Nurse Anesthetist

## 2017-12-01 ENCOUNTER — Encounter (HOSPITAL_COMMUNITY): Payer: Self-pay | Admitting: Certified Registered Nurse Anesthetist

## 2017-12-01 ENCOUNTER — Other Ambulatory Visit: Payer: Self-pay

## 2017-12-01 ENCOUNTER — Ambulatory Visit (HOSPITAL_COMMUNITY)
Admission: RE | Admit: 2017-12-01 | Discharge: 2017-12-02 | Disposition: A | Discharge status: Home / Self Care | Payer: No Typology Code available for payment source | Source: Ambulatory Visit | Attending: Gynecologic Oncology | Admitting: Gynecologic Oncology

## 2017-12-01 ENCOUNTER — Encounter (HOSPITAL_COMMUNITY)
Admission: RE | Disposition: A | Discharge status: Home / Self Care | Payer: Self-pay | Source: Ambulatory Visit | Attending: Gynecologic Oncology

## 2017-12-01 DIAGNOSIS — Z79899 Other long term (current) drug therapy: Secondary | ICD-10-CM | POA: Insufficient documentation

## 2017-12-01 DIAGNOSIS — Z885 Allergy status to narcotic agent status: Secondary | ICD-10-CM | POA: Diagnosis not present

## 2017-12-01 DIAGNOSIS — C7A8 Other malignant neuroendocrine tumors: Secondary | ICD-10-CM | POA: Diagnosis not present

## 2017-12-01 DIAGNOSIS — C801 Malignant (primary) neoplasm, unspecified: Secondary | ICD-10-CM

## 2017-12-01 DIAGNOSIS — Z6838 Body mass index (BMI) 38.0-38.9, adult: Secondary | ICD-10-CM | POA: Diagnosis not present

## 2017-12-01 DIAGNOSIS — Z8742 Personal history of other diseases of the female genital tract: Secondary | ICD-10-CM | POA: Insufficient documentation

## 2017-12-01 DIAGNOSIS — Z888 Allergy status to other drugs, medicaments and biological substances status: Secondary | ICD-10-CM | POA: Diagnosis not present

## 2017-12-01 DIAGNOSIS — Z882 Allergy status to sulfonamides status: Secondary | ICD-10-CM | POA: Diagnosis not present

## 2017-12-01 DIAGNOSIS — C541 Malignant neoplasm of endometrium: Secondary | ICD-10-CM | POA: Diagnosis present

## 2017-12-01 DIAGNOSIS — C786 Secondary malignant neoplasm of retroperitoneum and peritoneum: Secondary | ICD-10-CM | POA: Diagnosis present

## 2017-12-01 DIAGNOSIS — I10 Essential (primary) hypertension: Secondary | ICD-10-CM | POA: Diagnosis not present

## 2017-12-01 HISTORY — PX: ROBOTIC ASSISTED TOTAL HYSTERECTOMY WITH BILATERAL SALPINGO OOPHERECTOMY: SHX6086

## 2017-12-01 LAB — TYPE AND SCREEN
ABO/RH(D): O POS
Antibody Screen: NEGATIVE

## 2017-12-01 SURGERY — HYSTERECTOMY, TOTAL, ROBOT-ASSISTED, LAPAROSCOPIC, WITH BILATERAL SALPINGO-OOPHORECTOMY
Anesthesia: General | Laterality: Bilateral

## 2017-12-01 MED ORDER — INDOCYANINE GREEN 25 MG IV SOLR
INTRAVENOUS | Status: DC | PRN
  Administered 2017-12-01: 2.5 mg

## 2017-12-01 MED ORDER — HYDROMORPHONE HCL 1 MG/ML IJ SOLN
INTRAMUSCULAR | Status: DC | PRN
  Administered 2017-12-01 (×2): 1 mg via INTRAVENOUS

## 2017-12-01 MED ORDER — KCL IN DEXTROSE-NACL 20-5-0.45 MEQ/L-%-% IV SOLN
INTRAVENOUS | Status: DC
  Administered 2017-12-01: 18:00:00 via INTRAVENOUS
  Filled 2017-12-01 (×2): qty 1000

## 2017-12-01 MED ORDER — ONDANSETRON HCL 4 MG/2ML IJ SOLN
INTRAMUSCULAR | Status: AC
  Filled 2017-12-01: qty 2

## 2017-12-01 MED ORDER — ONDANSETRON HCL 4 MG PO TABS: 4.0000 mg | ORAL_TABLET | Freq: Four times a day (QID) | ORAL | Status: DC | PRN

## 2017-12-01 MED ORDER — FENTANYL CITRATE (PF) 100 MCG/2ML IJ SOLN
INTRAMUSCULAR | Status: DC | PRN
  Administered 2017-12-01: 100 ug via INTRAVENOUS
  Administered 2017-12-01 (×3): 50 ug via INTRAVENOUS

## 2017-12-01 MED ORDER — HYDROMORPHONE HCL 1 MG/ML IJ SOLN
0.2500 mg | INTRAMUSCULAR | Status: DC | PRN
  Administered 2017-12-01: 0.5 mg via INTRAVENOUS

## 2017-12-01 MED ORDER — MIDAZOLAM HCL 5 MG/5ML IJ SOLN
INTRAMUSCULAR | Status: DC | PRN
  Administered 2017-12-01: 2 mg via INTRAVENOUS

## 2017-12-01 MED ORDER — ONDANSETRON HCL 4 MG/2ML IJ SOLN
INTRAMUSCULAR | Status: DC | PRN
  Administered 2017-12-01: 4 mg via INTRAVENOUS

## 2017-12-01 MED ORDER — HYDROMORPHONE HCL 1 MG/ML IJ SOLN
INTRAMUSCULAR | Status: AC
  Filled 2017-12-01: qty 1

## 2017-12-01 MED ORDER — MEPERIDINE HCL 50 MG/ML IJ SOLN: 6.2500 mg | INTRAMUSCULAR | Status: DC | PRN

## 2017-12-01 MED ORDER — LIDOCAINE 2% (20 MG/ML) 5 ML SYRINGE
INTRAMUSCULAR | Status: DC | PRN
  Administered 2017-12-01: 80 mg via INTRAVENOUS

## 2017-12-01 MED ORDER — STERILE WATER FOR INJECTION IJ SOLN
INTRAMUSCULAR | Status: DC | PRN
  Administered 2017-12-01: 4 mL

## 2017-12-01 MED ORDER — ONDANSETRON HCL 4 MG/2ML IJ SOLN: 4.0000 mg | Freq: Four times a day (QID) | INTRAMUSCULAR | Status: DC | PRN

## 2017-12-01 MED ORDER — HYDROCHLOROTHIAZIDE 25 MG PO TABS
25.0000 mg | ORAL_TABLET | Freq: Every day | ORAL | Status: DC
Start: 2017-12-02 — End: 2017-12-02
  Administered 2017-12-02: 25 mg via ORAL
  Filled 2017-12-01: qty 1

## 2017-12-01 MED ORDER — GABAPENTIN 300 MG PO CAPS
300.0000 mg | ORAL_CAPSULE | ORAL | Status: AC
  Administered 2017-12-01: 300 mg via ORAL
  Filled 2017-12-01: qty 1

## 2017-12-01 MED ORDER — SUGAMMADEX SODIUM 500 MG/5ML IV SOLN
INTRAVENOUS | Status: AC
  Filled 2017-12-01: qty 5

## 2017-12-01 MED ORDER — GABAPENTIN 300 MG PO CAPS
600.0000 mg | ORAL_CAPSULE | Freq: Every day | ORAL | Status: AC
  Administered 2017-12-01: 600 mg via ORAL
  Filled 2017-12-01: qty 2

## 2017-12-01 MED ORDER — HYDROMORPHONE HCL 1 MG/ML IJ SOLN: 0.2500 mg | INTRAMUSCULAR | Status: DC | PRN

## 2017-12-01 MED ORDER — IBUPROFEN 200 MG PO TABS
600.0000 mg | ORAL_TABLET | Freq: Four times a day (QID) | ORAL | Status: DC
  Administered 2017-12-02: 600 mg via ORAL
  Filled 2017-12-01: qty 3

## 2017-12-01 MED ORDER — ONDANSETRON HCL 4 MG/2ML IJ SOLN: 4.0000 mg | Freq: Once | INTRAMUSCULAR | Status: DC | PRN

## 2017-12-01 MED ORDER — LACTATED RINGERS IR SOLN
Status: DC | PRN
  Administered 2017-12-01: 1000 mL

## 2017-12-01 MED ORDER — HYDROMORPHONE HCL 1 MG/ML IJ SOLN: 0.2000 mg | INTRAMUSCULAR | Status: DC | PRN

## 2017-12-01 MED ORDER — BENAZEPRIL-HYDROCHLOROTHIAZIDE 20-12.5 MG PO TABS: 2.0000 | ORAL_TABLET | Freq: Every morning | ORAL | Status: DC

## 2017-12-01 MED ORDER — ROCURONIUM BROMIDE 10 MG/ML (PF) SYRINGE
PREFILLED_SYRINGE | INTRAVENOUS | Status: DC | PRN
  Administered 2017-12-01: 50 mg via INTRAVENOUS
  Administered 2017-12-01: 10 mg via INTRAVENOUS

## 2017-12-01 MED ORDER — OXYCODONE HCL 5 MG PO TABS
5.0000 mg | ORAL_TABLET | ORAL | Status: DC | PRN
  Administered 2017-12-01: 5 mg via ORAL
  Filled 2017-12-01: qty 1

## 2017-12-01 MED ORDER — CEFAZOLIN SODIUM-DEXTROSE 2-4 GM/100ML-% IV SOLN
2.0000 g | INTRAVENOUS | Status: AC
  Administered 2017-12-01: 2 g via INTRAVENOUS
  Filled 2017-12-01: qty 100

## 2017-12-01 MED ORDER — DEXAMETHASONE SODIUM PHOSPHATE 4 MG/ML IJ SOLN: 4.0000 mg | INTRAMUSCULAR | Status: DC

## 2017-12-01 MED ORDER — PROPOFOL 10 MG/ML IV BOLUS
INTRAVENOUS | Status: AC
  Filled 2017-12-01: qty 20

## 2017-12-01 MED ORDER — ENOXAPARIN SODIUM 40 MG/0.4ML ~~LOC~~ SOLN
40.0000 mg | SUBCUTANEOUS | Status: DC
  Administered 2017-12-02: 40 mg via SUBCUTANEOUS
  Filled 2017-12-01: qty 0.4

## 2017-12-01 MED ORDER — ACETAMINOPHEN 500 MG PO TABS
1000.0000 mg | ORAL_TABLET | ORAL | Status: AC
  Administered 2017-12-01: 1000 mg via ORAL
  Filled 2017-12-01: qty 2

## 2017-12-01 MED ORDER — STERILE WATER FOR INJECTION IJ SOLN
INTRAMUSCULAR | Status: AC
  Filled 2017-12-01: qty 10

## 2017-12-01 MED ORDER — SUGAMMADEX SODIUM 500 MG/5ML IV SOLN
INTRAVENOUS | Status: DC | PRN
  Administered 2017-12-01: 300 mg via INTRAVENOUS

## 2017-12-01 MED ORDER — ACETAMINOPHEN 500 MG PO TABS
1000.0000 mg | ORAL_TABLET | Freq: Two times a day (BID) | ORAL | Status: DC
  Administered 2017-12-01 – 2017-12-02 (×2): 1000 mg via ORAL
  Filled 2017-12-01 (×2): qty 2

## 2017-12-01 MED ORDER — SCOPOLAMINE 1 MG/3DAYS TD PT72
1.0000 | MEDICATED_PATCH | TRANSDERMAL | Status: DC
  Administered 2017-12-01: 1.5 mg via TRANSDERMAL
  Filled 2017-12-01: qty 1

## 2017-12-01 MED ORDER — TRAMADOL HCL 50 MG PO TABS: 100.0000 mg | ORAL_TABLET | Freq: Two times a day (BID) | ORAL | Status: DC | PRN

## 2017-12-01 MED ORDER — HYDROMORPHONE HCL 2 MG/ML IJ SOLN
INTRAMUSCULAR | Status: AC
  Filled 2017-12-01: qty 1

## 2017-12-01 MED ORDER — DEXAMETHASONE SODIUM PHOSPHATE 10 MG/ML IJ SOLN
INTRAMUSCULAR | Status: DC | PRN
  Administered 2017-12-01: 10 mg via INTRAVENOUS

## 2017-12-01 MED ORDER — PROPOFOL 10 MG/ML IV BOLUS
INTRAVENOUS | Status: DC | PRN
  Administered 2017-12-01: 110 mg via INTRAVENOUS

## 2017-12-01 MED ORDER — ENOXAPARIN SODIUM 40 MG/0.4ML ~~LOC~~ SOLN
40.0000 mg | SUBCUTANEOUS | Status: AC
  Administered 2017-12-01: 40 mg via SUBCUTANEOUS
  Filled 2017-12-01: qty 0.4

## 2017-12-01 MED ORDER — FENTANYL CITRATE (PF) 250 MCG/5ML IJ SOLN
INTRAMUSCULAR | Status: AC
  Filled 2017-12-01: qty 5

## 2017-12-01 MED ORDER — SENNOSIDES-DOCUSATE SODIUM 8.6-50 MG PO TABS
2.0000 | ORAL_TABLET | Freq: Every day | ORAL | Status: DC
  Administered 2017-12-01: 2 via ORAL
  Filled 2017-12-01: qty 2

## 2017-12-01 MED ORDER — LACTATED RINGERS IV SOLN
INTRAVENOUS | Status: DC
  Administered 2017-12-01 (×2): via INTRAVENOUS

## 2017-12-01 MED ORDER — MIDAZOLAM HCL 2 MG/2ML IJ SOLN
INTRAMUSCULAR | Status: AC
  Filled 2017-12-01: qty 2

## 2017-12-01 MED ORDER — DEXAMETHASONE SODIUM PHOSPHATE 10 MG/ML IJ SOLN
INTRAMUSCULAR | Status: AC
  Filled 2017-12-01: qty 1

## 2017-12-01 MED ORDER — BENAZEPRIL HCL 10 MG PO TABS
40.0000 mg | ORAL_TABLET | Freq: Every day | ORAL | Status: DC
  Administered 2017-12-02: 40 mg via ORAL
  Filled 2017-12-01: qty 4

## 2017-12-01 SURGICAL SUPPLY — 49 items
ADH SKN CLS APL DERMABOND .7 (GAUZE/BANDAGES/DRESSINGS) ×1
AGENT HMST KT MTR STRL THRMB (HEMOSTASIS)
APL ESCP 34 STRL LF DISP (HEMOSTASIS)
APPLICATOR SURGIFLO ENDO (HEMOSTASIS) IMPLANT
BAG LAPAROSCOPIC 12 15 PORT 16 (BASKET) IMPLANT
BAG RETRIEVAL 12/15 (BASKET)
BAG SPEC RTRVL LRG 6X4 10 (ENDOMECHANICALS)
COVER BACK TABLE 60X90IN (DRAPES) ×2 IMPLANT
COVER TIP SHEARS 8 DVNC (MISCELLANEOUS) ×1 IMPLANT
COVER TIP SHEARS 8MM DA VINCI (MISCELLANEOUS) ×1
DERMABOND ADVANCED (GAUZE/BANDAGES/DRESSINGS) ×1
DERMABOND ADVANCED .7 DNX12 (GAUZE/BANDAGES/DRESSINGS) ×1 IMPLANT
DRAPE ARM DVNC X/XI (DISPOSABLE) ×4 IMPLANT
DRAPE COLUMN DVNC XI (DISPOSABLE) ×1 IMPLANT
DRAPE DA VINCI XI ARM (DISPOSABLE) ×4
DRAPE DA VINCI XI COLUMN (DISPOSABLE) ×1
DRAPE SHEET LG 3/4 BI-LAMINATE (DRAPES) ×2 IMPLANT
DRAPE SURG IRRIG POUCH 19X23 (DRAPES) ×2 IMPLANT
ELECT REM PT RETURN 15FT ADLT (MISCELLANEOUS) ×2 IMPLANT
GLOVE BIO SURGEON STRL SZ 6 (GLOVE) ×8 IMPLANT
GLOVE BIO SURGEON STRL SZ 6.5 (GLOVE) ×4 IMPLANT
GOWN STRL REUS W/ TWL LRG LVL3 (GOWN DISPOSABLE) ×2 IMPLANT
GOWN STRL REUS W/TWL LRG LVL3 (GOWN DISPOSABLE) ×4
HOLDER FOLEY CATH W/STRAP (MISCELLANEOUS) ×2 IMPLANT
IRRIG SUCT STRYKERFLOW 2 WTIP (MISCELLANEOUS) ×2
IRRIGATION SUCT STRKRFLW 2 WTP (MISCELLANEOUS) ×1 IMPLANT
KIT PROCEDURE DA VINCI SI (MISCELLANEOUS)
KIT PROCEDURE DVNC SI (MISCELLANEOUS) IMPLANT
MANIPULATOR UTERINE 4.5 ZUMI (MISCELLANEOUS) ×2 IMPLANT
NDL SPNL 18GX3.5 QUINCKE PK (NEEDLE) ×1 IMPLANT
NEEDLE SPNL 18GX3.5 QUINCKE PK (NEEDLE) ×2 IMPLANT
OBTURATOR OPTICAL STANDARD 8MM (TROCAR) ×1
OBTURATOR OPTICAL STND 8 DVNC (TROCAR) ×1
OBTURATOR OPTICALSTD 8 DVNC (TROCAR) ×1 IMPLANT
PACK ROBOT GYN CUSTOM WL (TRAY / TRAY PROCEDURE) ×2 IMPLANT
PAD POSITIONING PINK XL (MISCELLANEOUS) ×2 IMPLANT
POUCH SPECIMEN RETRIEVAL 10MM (ENDOMECHANICALS) IMPLANT
SEAL CANN UNIV 5-8 DVNC XI (MISCELLANEOUS) ×4 IMPLANT
SEAL XI 5MM-8MM UNIVERSAL (MISCELLANEOUS) ×4
SET TRI-LUMEN FLTR TB AIRSEAL (TUBING) ×2 IMPLANT
SURGIFLO W/THROMBIN 8M KIT (HEMOSTASIS) IMPLANT
SUT VIC AB 0 CT1 27 (SUTURE)
SUT VIC AB 0 CT1 27XBRD ANTBC (SUTURE) IMPLANT
SYR 10ML LL (SYRINGE) ×2 IMPLANT
TOWEL OR NON WOVEN STRL DISP B (DISPOSABLE) ×2 IMPLANT
TRAP SPECIMEN MUCOUS 40CC (MISCELLANEOUS) IMPLANT
TRAY FOLEY W/METER SILVER 16FR (SET/KITS/TRAYS/PACK) ×2 IMPLANT
UNDERPAD 30X30 (UNDERPADS AND DIAPERS) ×2 IMPLANT
WATER STERILE IRR 1000ML POUR (IV SOLUTION) ×4 IMPLANT

## 2017-12-01 NOTE — Interval H&P Note (Signed)
History and Physical Interval Note:  12/01/2017 10:55 AM  Connie Simmons  has presented today for surgery, with the diagnosis of endometrial cancer  The various methods of treatment have been discussed with the patient and family. After consideration of risks, benefits and other options for treatment, the patient has consented to  Procedure(s): XI ROBOTIC ASSISTED TOTAL HYSTERECTOMY WITH BILATERAL SALPINGO OOPHORECTOMY WITH SENTINAL LYMPH NODES (Bilateral) as a surgical intervention .  The patient's history has been reviewed, patient examined, no change in status, stable for surgery.  I have reviewed the patient's chart and labs. She has peritoneal nodularity on CT scan  Questions were answered to the patient's satisfaction.      Donaciano Eva

## 2017-12-01 NOTE — Anesthesia Preprocedure Evaluation (Signed)
Anesthesia Evaluation  Patient identified by MRN, date of birth, ID band Patient awake    Reviewed: Allergy & Precautions, NPO status , Patient's Chart, lab work & pertinent test results  History of Anesthesia Complications (+) PONV  Airway Mallampati: I  TM Distance: >3 FB Neck ROM: Full    Dental   Pulmonary    Pulmonary exam normal        Cardiovascular hypertension, Pt. on medications Normal cardiovascular exam     Neuro/Psych    GI/Hepatic   Endo/Other    Renal/GU      Musculoskeletal   Abdominal   Peds  Hematology   Anesthesia Other Findings   Reproductive/Obstetrics                             Anesthesia Physical Anesthesia Plan  ASA: II  Anesthesia Plan: General   Post-op Pain Management:    Induction: Intravenous  PONV Risk Score and Plan: 4 or greater and Scopolamine patch - Pre-op, Midazolam, Dexamethasone and Ondansetron  Airway Management Planned: Oral ETT  Additional Equipment:   Intra-op Plan:   Post-operative Plan: Extubation in OR  Informed Consent: I have reviewed the patients History and Physical, chart, labs and discussed the procedure including the risks, benefits and alternatives for the proposed anesthesia with the patient or authorized representative who has indicated his/her understanding and acceptance.     Plan Discussed with: CRNA and Surgeon  Anesthesia Plan Comments:         Anesthesia Quick Evaluation

## 2017-12-01 NOTE — Anesthesia Procedure Notes (Signed)
Procedure Name: Intubation Date/Time: 12/01/2017 11:17 AM Performed by: Victoriano Lain, CRNA Pre-anesthesia Checklist: Patient identified, Emergency Drugs available, Suction available, Patient being monitored and Timeout performed Patient Re-evaluated:Patient Re-evaluated prior to induction Oxygen Delivery Method: Circle system utilized Preoxygenation: Pre-oxygenation with 100% oxygen Induction Type: IV induction Ventilation: Mask ventilation without difficulty Laryngoscope Size: Mac and 3 Grade View: Grade I Tube type: Oral Tube size: 7.0 mm Number of attempts: 1 Airway Equipment and Method: Stylet Placement Confirmation: ETT inserted through vocal cords under direct vision,  positive ETCO2 and breath sounds checked- equal and bilateral Secured at: 21 cm Tube secured with: Tape Dental Injury: Teeth and Oropharynx as per pre-operative assessment

## 2017-12-01 NOTE — Transfer of Care (Signed)
Immediate Anesthesia Transfer of Care Note  Patient: Connie Simmons  Procedure(s) Performed: XI ROBOTIC ASSISTED TOTAL HYSTERECTOMY WITH BILATERAL SALPINGO OOPHORECTOMY WITH SENTINAL LYMPH NODES (Bilateral )  Patient Location: PACU  Anesthesia Type:General  Level of Consciousness: awake, alert  and oriented  Airway & Oxygen Therapy: Patient Spontanous Breathing and Patient connected to face mask oxygen  Post-op Assessment: Report given to RN and Post -op Vital signs reviewed and stable  Post vital signs: Reviewed and stable  Last Vitals:  Vitals:   12/01/17 0942  BP: 126/76  Pulse: 86  Resp: 16  SpO2: 100%    Last Pain:  Vitals:   12/01/17 0942  TempSrc: Oral      Patients Stated Pain Goal: 4 (35/32/99 2426)  Complications: No apparent anesthesia complications

## 2017-12-01 NOTE — Anesthesia Postprocedure Evaluation (Signed)
Anesthesia Post Note  Patient: Connie Simmons  Procedure(s) Performed: XI ROBOTIC ASSISTED TOTAL HYSTERECTOMY WITH BILATERAL SALPINGO OOPHORECTOMY WITH SENTINAL LYMPH NODES (Bilateral )     Patient location during evaluation: PACU Anesthesia Type: General Level of consciousness: awake and alert Pain management: pain level controlled Vital Signs Assessment: post-procedure vital signs reviewed and stable Respiratory status: spontaneous breathing, nonlabored ventilation, respiratory function stable and patient connected to nasal cannula oxygen Cardiovascular status: blood pressure returned to baseline and stable Postop Assessment: no apparent nausea or vomiting Anesthetic complications: no    Last Vitals:  Vitals:   12/01/17 1420 12/01/17 1424  BP:    Pulse: (!) 56 (!) 57  Resp: (!) 9 (!) 8  Temp:    SpO2: 100% 100%    Last Pain:  Vitals:   12/01/17 1345  TempSrc:   PainSc: 6                  Dudley Mages DAVID

## 2017-12-01 NOTE — Op Note (Signed)
OPERATIVE NOTE 12/01/17  Surgeon: Donaciano Eva   Assistants: Dr Lahoma Crocker (an MD assistant was necessary for tissue manipulation, management of robotic instrumentation, retraction and positioning due to the complexity of the case and hospital policies).   Anesthesia: General endotracheal anesthesia  ASA Class: 3   Pre-operative Diagnosis: endometrial cancer grade 3 (serous)  Post-operative Diagnosis: same,  Operation: Robotic-assisted laparoscopic total hysterectomy >250gm with bilateral salpingoophorectomy, SLN biopsy, peritoneal nodule resection  Surgeon: Donaciano Eva  Assistant Surgeon: Lahoma Crocker MD  Anesthesia: GET  Urine Output: 300cc  Operative Findings: 14cm uterus, bulky with fibroids, grossly normal tubes and ovaries. No ascites. Multiple peritoneal nodules covering surface of pelvic peritoneum including near round ligament inserion on the left, sigmoid colon mesentery. There were also peritoneal nodules on the right peritoneal gutter adjacent to cecum which were above the pelvic brim (abdominal).  Estimated Blood Loss:  less than 50 mL      Total IV Fluids: 800 ml         Specimens: uterus, cervix, bilateral tubes and ovaries, right and left obturator SLN, peritoneal nodules.         Complications:  None; patient tolerated the procedure well.         Disposition: PACU - hemodynamically stable.  Procedure Details  The patient was seen in the Holding Room. The risks, benefits, complications, treatment options, and expected outcomes were discussed with the patient.  The patient concurred with the proposed plan, giving informed consent.  The site of surgery properly noted/marked. The patient was identified as Connie Simmons and the procedure verified as a Robotic-assisted hysterectomy with bilateral salpingo oophorectomy with SLN biopsy. A Time Out was held and the above information confirmed.  After induction of anesthesia, the  patient was draped and prepped in the usual sterile manner. Pt was placed in supine position after anesthesia and draped and prepped in the usual sterile manner. The abdominal drape was placed after the CholoraPrep had been allowed to dry for 3 minutes.  Her arms were tucked to her side with all appropriate precautions.  The shoulders were stabilized with padded shoulder blocks applied to the acromium processes.  The patient was placed in the semi-lithotomy position in [place].  The perineum was prepped with Betadine. The patient was then prepped. Foley catheter was placed.  A sterile speculum was placed in the vagina.  The cervix was grasped with a single-tooth tenaculum. 2mg  total of ICG was injected into the cervical stroma at 2 and 9 o'clock with 1cc injected at a 1cm and 78mm depth (concentration 0.5mg /ml) in all locations. The cervix was dilated with Kennon Rounds dilators.  The ZUMI uterine manipulator with a medium colpotomizer ring was placed without difficulty.  A pneum occluder balloon was placed over the manipulator.  OG tube placement was confirmed and to suction.   Next, a 5 mm skin incision was made 1 cm below the subcostal margin in the midclavicular line.  The 5 mm Optiview port and scope was used for direct entry.  Opening pressure was under 10 mm CO2.  The abdomen was insufflated and the findings were noted as above.   At this point and all points during the procedure, the patient's intra-abdominal pressure did not exceed 15 mmHg. Next, a 10 mm skin incision was made in the umbilicus and a right and left port was placed about 10 cm lateral to the robot port on the right and left side.  A fourth arm was placed in  the left lower quadrant 2 cm above and superior and medial to the anterior superior iliac spine.  All ports were placed under direct visualization.  The patient was placed in steep Trendelenburg.  Bowel was folded away into the upper abdomen.  The robot was docked in the normal  manner.  The right and left peritoneum were opened parallel to the IP ligament to open the retroperitoneal spaces bilaterally. The SLN mapping was performed in bilateral pelvic basins. The para rectal and paravesical spaces were opened up entirely with careful dissection below the level of the ureters bilaterally and to the depth of the uterine artery origin in order to skeletonize the uterine "web" and ensure visualization of all parametrial channels. The para-aortic basins were carefully exposed and evaluated for isolated para-aortic SLN's. Lymphatic channels were identified travelling to the following visualized sentinel lymph node's: right and left obturator. These SLN's were separated from their surrounding lymphatic tissue, removed and sent for permanent pathology.  The hysterectomy was started after the round ligament on the right side was incised and the retroperitoneum was entered and the pararectal space was developed.  The ureter was noted to be on the medial leaf of the broad ligament.  The peritoneum above the ureter was incised and stretched and the infundibulopelvic ligament was skeletonized, cauterized and cut.  The posterior peritoneum was taken down to the level of the KOH ring.  The anterior peritoneum was also taken down.  The bladder flap was created to the level of the KOH ring.  The uterine artery on the right side was skeletonized, cauterized and cut in the normal manner.  A similar procedure was performed on the left.  The colpotomy was made and the uterus, cervix, bilateral ovaries and tubes were amputated and delivered through the vagina. Vaginal delivery took approximately 20 minutes with careful manipulation and in tact extraction due to the large size of the uterus.  Pedicles were inspected and excellent hemostasis was achieved.    The abdominal cavity was closely inspected and all peritoneal nodules were sharply resected that could be visualized. These were sent for pathology.    The colpotomy at the vaginal cuff was closed with Vicryl on a CT1 needle in a runnin manner.  Irrigation was used and excellent hemostasis was achieved.  At this point in the procedure was completed.  Robotic instruments were removed under direct visulaization.  The robot was undocked. The 10 mm ports were closed with Vicryl on a UR-5 needle and the fascia was closed with 0 Vicryl on a UR-5 needle.  The skin was closed with 4-0 Vicryl in a subcuticular manner.  Dermabond was applied.  Sponge, lap and needle counts correct x 2.  The patient was taken to the recovery room in stable condition.  The vagina was swabbed with  minimal bleeding noted.   All instrument and needle counts were correct x  3.   The patient was transferred to the recovery room in a stable condition.  Donaciano Eva, MD

## 2017-12-02 ENCOUNTER — Encounter (HOSPITAL_COMMUNITY): Payer: Self-pay | Admitting: Gynecologic Oncology

## 2017-12-02 DIAGNOSIS — C541 Malignant neoplasm of endometrium: Secondary | ICD-10-CM | POA: Diagnosis not present

## 2017-12-02 LAB — BASIC METABOLIC PANEL
Anion gap: 8 (ref 5–15)
BUN: 21 mg/dL — ABNORMAL HIGH (ref 6–20)
CO2: 20 mmol/L — ABNORMAL LOW (ref 22–32)
Calcium: 8.7 mg/dL — ABNORMAL LOW (ref 8.9–10.3)
Chloride: 105 mmol/L (ref 101–111)
Creatinine, Ser: 0.89 mg/dL (ref 0.44–1.00)
GFR calc Af Amer: >60 mL/min (ref >60)
GFR calc non Af Amer: >60 mL/min (ref >60)
Glucose, Bld: 134 mg/dL — ABNORMAL HIGH (ref 65–99)
Potassium: 4.5 mmol/L (ref 3.5–5.1)
Sodium: 133 mmol/L — ABNORMAL LOW (ref 135–145)

## 2017-12-02 LAB — CBC
HCT: 26.9 % — ABNORMAL LOW (ref 36.0–46.0)
Hemoglobin: 9.1 g/dL — ABNORMAL LOW (ref 12.0–15.0)
MCH: 27.9 pg (ref 26.0–34.0)
MCHC: 33.8 g/dL (ref 30.0–36.0)
MCV: 82.5 fL (ref 78.0–100.0)
Platelets: 312 10*3/uL (ref 150–400)
RBC: 3.26 MIL/uL — ABNORMAL LOW (ref 3.87–5.11)
RDW: 13.9 % (ref 11.5–15.5)
WBC: 8.8 10*3/uL (ref 4.0–10.5)

## 2017-12-02 MED ORDER — OXYCODONE HCL 5 MG PO TABS: 5.0000 mg | ORAL_TABLET | ORAL | 0 refills | Status: DC | PRN

## 2017-12-02 NOTE — Progress Notes (Signed)
Discharge instructions reviewed with patient and son who both verbalize understanding.  All questions answered to the knowledge of this nurse; though son asks about herbal tea and verbalizes that he will follow up with MD to clarify.  Patient transported by NT via wheelchair to personal vehicle.

## 2017-12-02 NOTE — Discharge Summary (Signed)
Physician Discharge Summary  Patient ID: Connie Simmons MRN: [id number] DOB/AGE: April 20, 1946 72 y.o.  Admit date: 12/01/2017 Discharge date: 12/02/2017  Admission Diagnoses: Endometrial cancer [facility])  Discharge Diagnoses:  Principal Problem:   Endometrial cancer [facility]) Active Problems:   Peritoneal carcinomatosis ([place])   Morbid obesity ([place])   Discharged Condition:  The patient is in good condition and stable for discharge.    Hospital Course: On 12/01/2017, the patient underwent the following: Procedure(s): XI ROBOTIC ASSISTED TOTAL HYSTERECTOMY WITH BILATERAL SALPINGO OOPHORECTOMY WITH SENTINEL LYMPH NODES.  The postoperative course was uneventful.  She was discharged to home on postoperative day 1 tolerating a regular diet, voiding, pain controlled, passing flatus, ambulating without difficulty.  Consults: None  Significant Diagnostic Studies: None  Treatments: surgery: see above  Discharge Exam: Blood pressure (!) 119/57, pulse 62, temperature 98.8 F (37.1 C), temperature source Oral, resp. rate 16, height 5\' 4"  (1.626 m), weight 224 lb (101.6 kg), SpO2 100 %. General appearance: alert, cooperative and no distress Resp: clear to auscultation bilaterally Cardio: regular rate and rhythm, S1, S2 normal, no murmur, click, rub or gallop GI: soft, non-tender; bowel sounds normal; no masses,  no organomegaly and morbidly obese Extremities: extremities normal, atraumatic, no cyanosis or edema Incision/Wound: Lap sites with dermabond without erythema or drainage  Disposition: 01-Home or Self Care  Discharge Instructions    Call MD for:  difficulty breathing, headache or visual disturbances   Complete by:  As directed    Call MD for:  extreme fatigue   Complete by:  As directed    Call MD for:  hives   Complete by:  As directed    Call MD for:  persistant dizziness or light-headedness   Complete by:  As directed    Call MD for:  persistant nausea and vomiting   Complete by:   As directed    Call MD for:  redness, tenderness, or signs of infection (pain, swelling, redness, odor or green/yellow discharge around incision site)   Complete by:  As directed    Call MD for:  severe uncontrolled pain   Complete by:  As directed    Call MD for:  temperature >100.4   Complete by:  As directed    Diet - low sodium heart healthy   Complete by:  As directed    Driving Restrictions   Complete by:  As directed    No driving for 1 week.  Do not take narcotics and drive.   Increase activity slowly   Complete by:  As directed    Lifting restrictions   Complete by:  As directed    No lifting greater than 10 lbs.   Sexual Activity Restrictions   Complete by:  As directed    No sexual activity, nothing in the vagina, for 8 weeks.     Allergies as of 12/02/2017      Reactions   Fish Oil Nausea Only   Abdominal pain   Simvastatin Other (See Comments)   Abdominal pain   Codeine Itching   Sulfa Antibiotics Itching, Rash      Medication List    STOP taking these medications   megestrol 40 MG tablet Commonly known as:  MEGACE     TAKE these medications   acetaminophen 500 MG tablet Commonly known as:  TYLENOL Take 1,000 mg by mouth 2 (two) times daily as needed for moderate pain or headache.   benazepril-hydrochlorthiazide 20-12.5 MG tablet Commonly known as:  LOTENSIN HCT Take  2 tablets by mouth every morning.   calcium carbonate 500 MG chewable tablet Commonly known as:  TUMS - dosed in mg elemental calcium Chew 2 tablets by mouth daily.   fluticasone 50 MCG/ACT nasal spray Commonly known as:  FLONASE Place 1 spray into both nostrils daily as needed for allergies or rhinitis.   ibuprofen 200 MG tablet Commonly known as:  ADVIL,MOTRIN Take 400-600 mg by mouth 2 (two) times daily as needed for moderate pain.   multivitamin with minerals Tabs tablet Take 1 tablet by mouth daily.   nitrofurantoin (macrocrystal-monohydrate) 100 MG capsule Commonly known  as:  MACROBID Take 1 capsule (100 mg total) by mouth 2 (two) times daily.   oxyCODONE 5 MG immediate release tablet Commonly known as:  Oxy IR/ROXICODONE Take 1-2 tablets (5-10 mg total) by mouth every 4 (four) hours as needed for severe pain or breakthrough pain.        Greater than thirty minutes were spend for face to face discharge instructions and discharge orders/summary in EPIC.   Signed: Dorothyann Gibbs 12/02/2017, 10:16 AM

## 2017-12-02 NOTE — Discharge Instructions (Signed)
12/02/2017  Return to work: 4-6 weeks if applicable  Activity: 1. Be up and out of the bed during the day.  Take a nap if needed.  You may walk up steps but be careful and use the hand rail.  Stair climbing will tire you more than you think, you may need to stop part way and rest.   2. No lifting or straining for 6 weeks.  3. No driving for 1 week(s).  Do not drive if you are taking narcotic pain medicine.  4. Shower daily.  Use soap and water on your incision and pat dry; don't rub.  No tub baths until cleared by your surgeon.   5. No sexual activity and nothing in the vagina for 8 weeks.  6. You may experience a small amount of clear drainage from your incisions, which is normal.  If the drainage persists or increases, please call the office.  7. You may experience vaginal spotting after surgery or around the 6-8 week mark from surgery when the stitches at the top of the vagina begin to dissolve.  The spotting is normal but if you experience heavy bleeding, call our office.  8. Take Tylenol or ibuprofen first for pain and only use Oxycodone for severe pain not relieved by the Tylenol or Ibuprofen.    Diet: 1. Low sodium Heart Healthy Diet is recommended.  2. It is safe to use a laxative, such as Miralax or Colace, if you have difficulty moving your bowels. You can take Sennakot at bedtime every evening to keep bowel movements regular and to prevent constipation.    Wound Care: 1. Keep clean and dry.  Shower daily.  Reasons to call the Doctor:  Fever - Oral temperature greater than 100.4 degrees Fahrenheit  Foul-smelling vaginal discharge  Difficulty urinating  Nausea and vomiting  Increased pain at the site of the incision that is unrelieved with pain medicine.  Difficulty breathing with or without chest pain  New calf pain especially if only on one side  Sudden, continuing increased vaginal bleeding with or without clots.   Contacts: For questions or concerns you  should contact:  Dr. Everitt Amber at [phone]  Joylene John, NP at [phone]  After Hours: call [phone] and have the GYN Oncologist paged/contacted  Oxycodone tablets or capsules What is this medicine? OXYCODONE (ox i KOE done) is a pain reliever. It is used to treat moderate to severe pain. This medicine may be used for other purposes; ask your health care provider or pharmacist if you have questions. COMMON BRAND NAME(S): Dazidox, Endocodone, Oxaydo, OXECTA, OxyIR, Percolone, Roxicodone, ROXYBOND What should I tell my health care provider before I take this medicine? They need to know if you have any of these conditions: -Addison's disease -brain tumor -head injury -heart disease -history of drug or alcohol abuse problem -if you often drink alcohol -kidney disease -liver disease -lung or breathing disease, like asthma -mental illness -pancreatic disease -seizures -thyroid disease -an unusual or allergic reaction to oxycodone, codeine, hydrocodone, morphine, other medicines, foods, dyes, or preservatives -pregnant or trying to get pregnant -breast-feeding How should I use this medicine? Take this medicine by mouth with a glass of water. Follow the directions on the prescription label. You can take it with or without food. If it upsets your stomach, take it with food. Take your medicine at regular intervals. Do not take it more often than directed. Do not stop taking except on your doctor's advice. Some brands of this medicine, like  Oxecta, have special instructions. Ask your doctor or pharmacist if these directions are for you: Do not cut, crush or chew this medicine. Swallow only one tablet at a time. Do not wet, soak, or lick the tablet before you take it. A special MedGuide will be given to you by the pharmacist with each prescription and refill. Be sure to read this information carefully each time. Talk to your pediatrician regarding the use of this medicine in  children. Special care may be needed. Overdosage: If you think you have taken too much of this medicine contact a poison control center or emergency room at once. NOTE: This medicine is only for you. Do not share this medicine with others. What if I miss a dose? If you miss a dose, take it as soon as you can. If it is almost time for your next dose, take only that dose. Do not take double or extra doses. What may interact with this medicine? This medicine may interact with the following medications: -alcohol -antihistamines for allergy, cough and cold -antiviral medicines for HIV or AIDS -atropine -certain antibiotics like clarithromycin, erythromycin, linezolid, rifampin -certain medicines for anxiety or sleep -certain medicines for bladder problems like oxybutynin, tolterodine -certain medicines for depression like amitriptyline, fluoxetine, sertraline -certain medicines for fungal infections like ketoconazole, itraconazole, voriconazole -certain medicines for migraine headache like almotriptan, eletriptan, frovatriptan, naratriptan, rizatriptan, sumatriptan, zolmitriptan -certain medicines for nausea or vomiting like dolasetron, ondansetron, palonosetron -certain medicines for Parkinson's disease like benztropine, trihexyphenidyl -certain medicines for seizures like phenobarbital, phenytoin, primidone -certain medicines for stomach problems like dicyclomine, hyoscyamine -certain medicines for travel sickness like scopolamine -diuretics -general anesthetics like halothane, isoflurane, methoxyflurane, propofol -ipratropium -local anesthetics like lidocaine, pramoxine, tetracaine -MAOIs like Carbex, Eldepryl, Marplan, Nardil, and Parnate -medicines that relax muscles for surgery -methylene blue -nilotinib -other narcotic medicines for pain or cough -phenothiazines like chlorpromazine, mesoridazine, prochlorperazine, thioridazine This list may not describe all possible interactions.  Give your health care provider a list of all the medicines, herbs, non-prescription drugs, or dietary supplements you use. Also tell them if you smoke, drink alcohol, or use illegal drugs. Some items may interact with your medicine. What should I watch for while using this medicine? Tell your doctor or health care professional if your pain does not go away, if it gets worse, or if you have new or a different type of pain. You may develop tolerance to the medicine. Tolerance means that you will need a higher dose of the medicine for pain relief. Tolerance is normal and is expected if you take this medicine for a long time. Do not suddenly stop taking your medicine because you may develop a severe reaction. Your body becomes used to the medicine. This does NOT mean you are addicted. Addiction is a behavior related to getting and using a drug for a non-medical reason. If you have pain, you have a medical reason to take pain medicine. Your doctor will tell you how much medicine to take. If your doctor wants you to stop the medicine, the dose will be slowly lowered over time to avoid any side effects. There are different types of narcotic medicines (opiates). If you take more than one type at the same time or if you are taking another medicine that also causes drowsiness, you may have more side effects. Give your health care provider a list of all medicines you use. Your doctor will tell you how much medicine to take. Do not take more medicine than directed. Call  emergency for help if you have problems breathing or unusual sleepiness. You may get drowsy or dizzy. Do not drive, use machinery, or do anything that needs mental alertness until you know how the medicine affects you. Do not stand or sit up quickly, especially if you are an older patient. This reduces the risk of dizzy or fainting spells. Alcohol may interfere with the effect of this medicine. Avoid alcoholic drinks. This medicine will cause constipation.  Try to have a bowel movement at least every 2 to 3 days. If you do not have a bowel movement for 3 days, call your doctor or health care professional. Your mouth may get dry. Chewing sugarless gum or sucking hard candy, and drinking plenty of water may help. Contact your doctor if the problem does not go away or is severe. What side effects may I notice from receiving this medicine? Side effects that you should report to your doctor or health care professional as soon as possible: -allergic reactions like skin rash, itching or hives, swelling of the face, lips, or tongue -breathing problems -confusion -signs and symptoms of low blood pressure like dizziness; feeling faint or lightheaded, falls; unusually weak or tired -trouble passing urine or change in the amount of urine -trouble swallowing Side effects that usually do not require medical attention (report to your doctor or health care professional if they continue or are bothersome): -constipation -dry mouth -nausea, vomiting -tiredness This list may not describe all possible side effects. Call your doctor for medical advice about side effects. You may report side effects to FDA at 1-800-FDA-1088. Where should I keep my medicine? Keep out of the reach of children. This medicine can be abused. Keep your medicine in a safe place to protect it from theft. Do not share this medicine with anyone. Selling or giving away this medicine is dangerous and against the law. Store at room temperature between 15 and 30 degrees C (59 and 86 degrees F). Protect from light. Keep container tightly closed. This medicine may cause accidental overdose and death if it is taken by other adults, children, or pets. Flush any unused medicine down the toilet to reduce the chance of harm. Do not use the medicine after the expiration date. NOTE: This sheet is a summary. It may not cover all possible information. If you have questions about this medicine, talk to your  doctor, pharmacist, or health care provider.  2018 Elsevier/Gold Standard (2015-08-19 16:55:57)

## 2017-12-06 ENCOUNTER — Encounter: Payer: Self-pay | Admitting: Hematology and Oncology

## 2017-12-06 ENCOUNTER — Encounter: Payer: Self-pay | Admitting: Gynecologic Oncology

## 2017-12-06 DIAGNOSIS — C7A8 Other malignant neuroendocrine tumors: Secondary | ICD-10-CM | POA: Insufficient documentation

## 2017-12-06 NOTE — Progress Notes (Signed)
MSI ordered with Suanne Marker at [facility] on surgical path from 12/01/17.

## 2017-12-08 ENCOUNTER — Telehealth: Payer: Self-pay | Admitting: Gynecologic Oncology

## 2017-12-08 NOTE — Telephone Encounter (Signed)
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   I discussed that she will need 6 cycles adjuvant carb/tax with vaginal brachy for the serous endometrial cancer.  She will see Dr Alvy Bimler to further work up her metastatic neuroendocrine tumor.  She has a CT scan being ordered to further evaluate for the neuroendocrine tumor.

## 2017-12-12 ENCOUNTER — Telehealth: Payer: Self-pay | Admitting: Gynecologic Oncology

## 2017-12-12 ENCOUNTER — Encounter: Payer: Self-pay | Admitting: Gynecologic Oncology

## 2017-12-12 ENCOUNTER — Other Ambulatory Visit: Payer: Self-pay | Admitting: Gynecologic Oncology

## 2017-12-12 DIAGNOSIS — C7A8 Other malignant neuroendocrine tumors: Secondary | ICD-10-CM

## 2017-12-12 NOTE — Progress Notes (Signed)
Gynecologic Oncology Multi-Disciplinary Disposition Conference Note  Date of the Conference: December 12, 2017  Patient Name: Connie Simmons  Referring Provider: Dr. Nelda Marseille Primary GYN Oncologist: Dr. 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 Disposition is to 6 cycles of carboplatin and taxol with vaginal brachytherapy.  Plan for CT CAP and endoscopy/colonoscopy for neuroendocrine tumor workup.     This Multidisciplinary conference took place involving physicians from [place], [place], Radiation Oncology, Pathology, Radiology along with the Gynecologic Oncology Nurse Practitioner and RN.  Comprehensive assessment of the patient's malignancy, staging, need for surgery, chemotherapy, radiation therapy, and need for further testing were reviewed. Supportive measures, both inpatient and following discharge were also discussed. The recommended plan of care is documented. Greater than 35 minutes were spent correlating and coordinating this patient's care.

## 2017-12-12 NOTE — Telephone Encounter (Signed)
Discussed with patient the recommendations for a CT chest, abdomen, and pelvis.  Advised she would be contacted with a date and time.

## 2017-12-13 ENCOUNTER — Telehealth: Payer: Self-pay | Admitting: *Deleted

## 2017-12-13 NOTE — Telephone Encounter (Signed)
Called and scheduled the patient to have CT scans. Scans scheduled for March 6th at 12:30, arrive at 12:15pm. Patient to pick up contrast tomorrow.   Patient asked why the CT scans were being done. I explained to further evaluate the tumor. She had another question " is this for the non-aggressive tumor or for chemo therapy." Explained I didn't know the answer to the question, but would have [name] APP call her tomorrow.

## 2017-12-14 ENCOUNTER — Telehealth: Payer: Self-pay | Admitting: Genetic Counselor

## 2017-12-14 ENCOUNTER — Telehealth: Payer: Self-pay | Admitting: Gynecologic Oncology

## 2017-12-14 DIAGNOSIS — C541 Malignant neoplasm of endometrium: Secondary | ICD-10-CM

## 2017-12-14 NOTE — Telephone Encounter (Addendum)
Returned call to patient.  All questions answered.  Discussed MSI testing results and the recommendation for a genetics referral.  Advised patient to call for any needs.

## 2017-12-14 NOTE — Telephone Encounter (Signed)
Scheduled appt per 2/27 sch message - patient is aware of appt date and time.  

## 2017-12-21 ENCOUNTER — Encounter (HOSPITAL_COMMUNITY): Payer: Self-pay

## 2017-12-21 ENCOUNTER — Ambulatory Visit (HOSPITAL_COMMUNITY)
Admission: RE | Admit: 2017-12-21 | Discharge: 2017-12-21 | Disposition: A | Discharge status: Home / Self Care | Payer: No Typology Code available for payment source | Source: Ambulatory Visit | Attending: Gynecologic Oncology | Admitting: Gynecologic Oncology

## 2017-12-21 DIAGNOSIS — R918 Other nonspecific abnormal finding of lung field: Secondary | ICD-10-CM | POA: Diagnosis not present

## 2017-12-21 DIAGNOSIS — E041 Nontoxic single thyroid nodule: Secondary | ICD-10-CM | POA: Insufficient documentation

## 2017-12-21 DIAGNOSIS — C7A8 Other malignant neuroendocrine tumors: Secondary | ICD-10-CM | POA: Insufficient documentation

## 2017-12-21 DIAGNOSIS — Z90710 Acquired absence of both cervix and uterus: Secondary | ICD-10-CM | POA: Diagnosis not present

## 2017-12-21 MED ORDER — SODIUM CHLORIDE 0.9 % IJ SOLN
INTRAMUSCULAR | Status: AC
  Filled 2017-12-21: qty 50

## 2017-12-21 MED ORDER — IOPAMIDOL (ISOVUE-300) INJECTION 61%
INTRAVENOUS | Status: AC
  Filled 2017-12-21: qty 100

## 2017-12-21 MED ORDER — IOPAMIDOL (ISOVUE-300) INJECTION 61%
100.0000 mL | Freq: Once | INTRAVENOUS | Status: AC | PRN
Start: 2017-12-21 — End: 2017-12-21
  Administered 2017-12-21: 100 mL via INTRAVENOUS

## 2017-12-21 NOTE — Progress Notes (Signed)
Consult Note: Gyn-Onc  Consult was requested by Dr. Nelda Marseille for Connie evaluation of Connie Simmons 72 y.o. female  CC:  Chief Complaint  Simmons presents with  . Endometrial cancer [employer])    Assessment/Plan:  Connie Simmons  is a 72 y.o.  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 S/p staging surgery on 12/01/17.  I am recommending adjuvant therapy for Connie high grade endometrial cancer with 6 cycles carboplatin and paclitaxel and vaginal brachytherapy.  For her neuroendocrine tumor there is residual tiny miliary studding of Connie peritoneum however no apparent residual disease on imaging. She will obtain colonoscopy and EGD to evaluate for GI primary.  We will reimage after she has completed therapy for her endometrial cancer and determine if additional therapy is necessary.     HPI: Connie Simmons is a 72 year old P2 who is seen in consultation at Connie request of Dr Nelda Marseille for serous endometrial cancer in Connie setting of obesity (BMI 39kg/m2).  Connie Simmons experienced vaginal bleeding for approximately 6 months though initially it was only light spotting. In January, 2019 it became heavier. She was seen at [facility] on 11/04/17 and prescribed Megace. She was then seen by Dr Nelda Marseille and an endometrial biopsy was performed on 11/10/17 which showed high grade serous endometrial cancer. TVUS showed a uterus measuring 6.3x11.2x7cm. Connie endometrium was 4cm thick.   Connie Simmons has a family history of a grandfather with colon cancer. She has had 2 prior SVD's and an open cholecystectomy.   Interval Hx: On 11/23/17 preoperative CT abdo/pelvis showed signs of metastatic peritoneal disease with grossly thickened and heterogeneous endometrium consistent with known endometrial carcinoma. No CT findings to suggest serosal or extra uterine direct extension. No pelvic adenopathy. There are worrisome small  peritoneal nodules and a small mesenteric nodal mass in Connie root of Connie small bowel mesentery suspicious for metastatic disease. No metastatic pulmonary nodules at Connie lung bases or evidence of solid abdominal organ metastatic disease. On 12/01/17 she underwent robot assisted total hysterectomy, BSO, SLN biopsy and debulking of larger peritoneal implants. Final pathology revealed a 3.5cm high grade serous endometrial cancer with outer half myometrial invasion (1.2 of 2cm), negative LVSI and negative lymph nodes. Connie tumor was MSI high. There was an incidentally found neuroendocrine tumor on Connie left ovary with Connie following description:  Connie left ovary shows a 1 cm nodule of neoplasm characterized by sheets of cells with uniform, round to oval nuclei with rare mitotic figures. Iimmunohistochemistry is performed and Connie left ovarian tumor is positive with cytokeratin AE1/AE3, CD56, chromogranin, synaptophysin and CDX-2. Connie tumor is negative with inhibin, WT-1, calretinin, CD99, cytokerain 20, cytokeratin 7, estrogen receptor, progesterone receptor, vimentin, and p53. Connie morphology and immunophenotype are consistent with a low to intermediate grade neuroendocrine tumor and CDX-2 positivity suggests possible gastrointestinal origin. There is a similar 0.6 cm nodule in Connie paraovarian connective tissue and Connie peritoneal nodule (part 2) has identical features. Connie peritoneal nodules that were resected were also consistent with neuroendocrine tumor (not endometrial serous cancer).  Her case was reviewed at multidisciplinary tumor board and she was determined to have a primary neuroendocrine tumor and separate stage IB high grade serous endometrial cancer. She was recommended to undergo repeat staging imaging, GI evaluation, and then adjuvant therapy for Connie endometrial cancer.   Follow-up post-operative imaging of Connie chest abdomen and pelvis on 12/21/17 showed a  2.4cm left thyroid lobe nodule. A few tiny scattered  bilateral upper lobe pulmonary nodules are seen measuring less than 57m, which are indeterminate. There was no residual or metastatic carcinoma seen within Connie abdomen or pelvis.  Given Connie high risk uterine pathology she was recommended to receive 6 cycles of adjuvant carb/taxol chemotherapy and vaginal brachytherapy.  Her last colonoscopy was 2018 (benign polyp).  Current Meds:  Outpatient Encounter Medications as of 12/22/2017  Medication Sig  . acetaminophen (TYLENOL) 500 MG tablet Take 1,000 mg by mouth 2 (two) times daily as needed for moderate pain or headache.  . benazepril-hydrochlorthiazide (LOTENSIN HCT) 20-12.5 MG tablet Take 2 tablets by mouth every morning.   . calcium carbonate (TUMS - DOSED IN MG ELEMENTAL CALCIUM) 500 MG chewable tablet Chew 2 tablets by mouth daily.  . fluticasone (FLONASE) 50 MCG/ACT nasal spray Place 1 spray into both nostrils daily as needed for allergies or rhinitis.  .Marland Kitchenibuprofen (ADVIL,MOTRIN) 200 MG tablet Take 400-600 mg by mouth 2 (two) times daily as needed for moderate pain.   . Multiple Vitamin (MULTIVITAMIN WITH MINERALS) TABS tablet Take 1 tablet by mouth daily.  . [DISCONTINUED] nitrofurantoin, macrocrystal-monohydrate, (MACROBID) 100 MG capsule Take 1 capsule (100 mg total) by mouth 2 (two) times daily.  . [DISCONTINUED] oxyCODONE (OXY IR/ROXICODONE) 5 MG immediate release tablet Take 1-2 tablets (5-10 mg total) by mouth every 4 (four) hours as needed for severe pain or breakthrough pain.  . [DISCONTINUED] iopamidol (ISOVUE-300) 61 % injection   . [DISCONTINUED] sodium chloride 0.9 % injection    No facility-administered encounter medications on file as of 12/22/2017.     Allergy:  Allergies  Allergen Reactions  . Fish Oil Nausea Only    Abdominal pain  . Simvastatin Other (See Comments)    Abdominal pain  . Codeine Itching  . Sulfa Antibiotics Itching and Rash    Social Hx:   Social History   Socioeconomic History  . Marital status:  Divorced    Spouse name: Not on file  . Number of children: Not on file  . Years of education: Not on file  . Highest education level: Not on file  Social Needs  . Financial resource strain: Not on file  . Food insecurity - worry: Not on file  . Food insecurity - inability: Not on file  . Transportation needs - medical: Not on file  . Transportation needs - non-medical: Not on file  Occupational History  . Not on file  Tobacco Use  . Smoking status: Never Smoker  . Smokeless tobacco: Never Used  Substance and Sexual Activity  . Alcohol use: No  . Drug use: No  . Sexual activity: Not on file  Other Topics Concern  . Not on file  Social History Narrative  . Not on file    Past Surgical Hx:  Past Surgical History:  Procedure Laterality Date  . CHOLECYSTECTOMY OPEN  1974  . DILATION AND CURETTAGE OF UTERUS  1980s  . ROBOTIC ASSISTED TOTAL HYSTERECTOMY WITH BILATERAL SALPINGO OOPHERECTOMY Bilateral 12/01/2017   Procedure: XI ROBOTIC ASSISTED TOTAL HYSTERECTOMY WITH BILATERAL SALPINGO OOPHORECTOMY WITH SENTINAL LYMPH NODES;  Surgeon: REveritt Amber MD;  Location: [facility];  Service: Gynecology;  Laterality: Bilateral;  . STRABISMUS SURGERY  age 72  unilateral    Past Medical Hx:  Past Medical History:  Diagnosis Date  . Anemia   . Dental bridge present    front upper tooth  . endometrial ca dx'd 10/2017  .  Fibroid   . Hypercholesteremia   . Hypertension    followed by pcp  . Nocturia   . PONV (postoperative nausea and vomiting)     Past Gynecological History:  SVD x 2, fibroids No LMP recorded. Simmons is postmenopausal.  Family Hx:  Family History  Problem Relation Age of Onset  . Colon cancer Other     Review of Systems:  Constitutional  Feels well,    ENT Normal appearing ears and nares bilaterally Skin/Breast  No rash, sores, jaundice, itching, dryness Cardiovascular  No chest pain, shortness of breath, or edema  Pulmonary  No cough or wheeze.  Gastro  Intestinal  No nausea, vomitting, or diarrhoea. No bright red blood per rectum, no abdominal pain, change in bowel movement, or constipation.  Genito Urinary  No frequency, urgency, dysuria, + heavy vaginal bleeding Musculo Skeletal  No myalgia, arthralgia, joint swelling or pain  Neurologic  No weakness, numbness, change in gait,  Psychology  No depression, anxiety, insomnia.   Vitals:  Blood pressure 122/60, pulse (!) 55, temperature 98 F (36.7 C), temperature source Oral, resp. rate 18, weight 223 lb (101.2 kg), SpO2 100 %.  Physical Exam: WD in NAD Neck  Supple NROM, without any enlargements.  Lymph Node Survey No cervical supraclavicular or inguinal adenopathy Cardiovascular  Pulse normal rate, regularity and rhythm. S1 and S2 normal.  Lungs  Clear to auscultation bilateraly, without wheezes/crackles/rhonchi. Good air movement.  Skin  No rash/lesions/breakdown  Psychiatry  Alert and oriented to person, place, and time  Abdomen  Normoactive bowel sounds, abdomen soft, non-tender and obese without evidence of hernia. Incisions well healed.  Back No CVA tenderness Genito Urinary  Vulva/vagina: Normal external female genitalia.  No lesions. No discharge or bleeding.  Bladder/urethra:  No lesions or masses, well supported bladder  Vagina: vaginal cuff healing normally with suture material present. Rectal  deferred Extremities  No bilateral cyanosis, clubbing or edema.   30 minutes of direct face to face counseling time was spent with Connie Simmons. This included discussion about prognosis, therapy recommendations and postoperative side effects and are beyond Connie scope of routine postoperative care.   Thereasa Solo, MD  12/22/2017, 12:44 PM

## 2017-12-22 ENCOUNTER — Encounter: Payer: Self-pay | Admitting: Hematology and Oncology

## 2017-12-22 ENCOUNTER — Encounter: Payer: Self-pay | Admitting: Gynecologic Oncology

## 2017-12-22 ENCOUNTER — Inpatient Hospital Stay: Payer: No Typology Code available for payment source | Attending: Gynecologic Oncology | Admitting: Gynecologic Oncology

## 2017-12-22 ENCOUNTER — Inpatient Hospital Stay (HOSPITAL_BASED_OUTPATIENT_CLINIC_OR_DEPARTMENT_OTHER): Payer: No Typology Code available for payment source | Admitting: Hematology and Oncology

## 2017-12-22 VITALS — BP 122/60 | HR 55 | Temp 98.0°F | Resp 18 | Wt 223.0 lb

## 2017-12-22 DIAGNOSIS — E041 Nontoxic single thyroid nodule: Secondary | ICD-10-CM | POA: Insufficient documentation

## 2017-12-22 DIAGNOSIS — Z5189 Encounter for other specified aftercare: Secondary | ICD-10-CM | POA: Diagnosis not present

## 2017-12-22 DIAGNOSIS — C541 Malignant neoplasm of endometrium: Secondary | ICD-10-CM

## 2017-12-22 DIAGNOSIS — Z7189 Other specified counseling: Secondary | ICD-10-CM | POA: Diagnosis not present

## 2017-12-22 DIAGNOSIS — C7A8 Other malignant neuroendocrine tumors: Secondary | ICD-10-CM

## 2017-12-22 DIAGNOSIS — Z5111 Encounter for antineoplastic chemotherapy: Secondary | ICD-10-CM | POA: Diagnosis present

## 2017-12-22 DIAGNOSIS — C7B8 Other secondary neuroendocrine tumors: Secondary | ICD-10-CM | POA: Diagnosis not present

## 2017-12-22 NOTE — Patient Instructions (Signed)
Dr Denman George is recommending that you meet with genetics after you have completed therapy.  Dr Alvy Bimler will refer you back to Dr Denman George for surveillance visits after your chemotherapy is completed.

## 2017-12-22 NOTE — Progress Notes (Signed)
START ON PATHWAY REGIMEN - Uterine     A cycle is every 21 days:     Paclitaxel      Carboplatin   **Always confirm dose/schedule in your pharmacy ordering system**    Patient Characteristics: Papillary Serous and Clear Cell Histology, Newly Diagnosed, Resected Histology: Papillary Serous and Clear Cell Histology Therapeutic Status: Newly Diagnosed AJCC T Category: T1 AJCC N Category: N0 AJCC M Category: M0 AJCC 8 Stage Grouping: I Would you be surprised if this patient died  in the next year<= I would be surprised if this patient died in the next year Surgical Status: Resected Intent of Therapy: Curative Intent, Discussed with Patient

## 2017-12-23 DIAGNOSIS — E041 Nontoxic single thyroid nodule: Secondary | ICD-10-CM | POA: Insufficient documentation

## 2017-12-23 NOTE — Assessment & Plan Note (Addendum)
The patient's son, Annie Main, is visibly upset over the prospect of the patient is recommended to receive systemic chemotherapy with radiation. He was under the impression that the patient should not need significant treatment if the surgeon has complete resection of the primary tumor & "got it all" with negative imaging studies He stepped outside the room several times and appears to be in anger/disappointment. I tried to talk to the patient and her son in the compassionate manner, explaining to them the role of adjuvant treatment of curative intent. Given the high-grade nature of her disease, without adjuvant chemotherapy, there is a little high likelihood that her disease may relapse in the future. Her son wanted a guarantee that after adjuvant treatment, she would be 100% alive and not eventually succumb to recurrence of cancer I told the patient's son that I cannot guarantee that. I will try my very best to support her through her adjuvant treatment and I estimated her prognosis is excellent given her good state of health Even if her endometrial cancer does not relapse in her lifetime, we still have to follow her closely for the neuroendocrine tumor of unknown origin. Her son wanted that the patient to go home and think about the prospect of adjuvant treatment. However, the patient has made an informed decision to pursue adjuvant treatment I recommend chemo education class, port placement and chemotherapy consent before we start her treatment, anticipated start date of treatment to be on 01/02/2018. The final pathology report revealed that the patient has MSI high disease She would qualify for genetic testing but given that the patient appears overwhelmed, we discussed with her for the next week

## 2017-12-23 NOTE — Assessment & Plan Note (Signed)
CT imaging revealed incidental left thyroid nodule She is not symptomatic I will order thyroid function test next week to follow

## 2017-12-23 NOTE — Assessment & Plan Note (Addendum)
CT imaging did not review obvious source of neuroendocrine tumor. She is not symptomatic I plan to refer her to GI service for EGD and colonoscopy and will order for neuroendocrine testing to follow-up on this in the future.

## 2017-12-23 NOTE — Progress Notes (Signed)
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 female is here because of recent diagnosis of cancer. The patient is divorced. Her son, Annie Main is present.  Her other son, Eddie Dibbles is not here. Her symptoms began with intermittent postmenopausal bleeding for some time. She subsequently underwent further evaluation earlier this year when she started to have more frequent postmenopausal bleeding around December 2018.  I have reviewed her chart and materials related to her cancer extensively and collaborated history with the patient. 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 - SEE COMMENT. Microscopic Comment Sections are of a high grade carcinoma with mixed glandular, papillary and solid patterns. The histomorphology and immunostain pattern of ER negative, PR negative, p53 positive and p16 positive is compatible with a serous carcinoma. Internal departmental review obtained (Dr. Lyndon Code) with agreement. The results were called to Dr. Nelda Marseille on on 11/14/17. (MEG:kh 11/14/17)      11/23/2017 Imaging    1. Grossly thickened and heterogeneous endometrium consistent with known endometrial carcinoma. No CT findings to suggest serosal or extra uterine direct extension. No pelvic adenopathy.  2. There are worrisome small peritoneal nodules and a small mesenteric nodal mass in the root of the small bowel mesentery suspicious for metastatic disease. 3. No metastatic pulmonary nodules at the  lung bases or evidence of solid abdominal organ metastatic disease.       12/01/2017 Pathology Results    1. Lymph node, sentinel, biopsy, right obturator - ONE BENIGN LYMPH NODE (0/1). 2. Peritoneum, biopsy, peritoneal nodule - NEUROENDOCRINE TUMOR. - SEE COMMENT. 3. Lymph node, sentinel, biopsy, left obturator - SEVEN BENIGN LYMPH NODES (0/7). 4. Uterus +/- tubes/ovaries, neoplastic ENDOMETRIUM. - HIGH GRADE SEROUS CARCINOMA, 3.7 CM. - CARCINOMA FOCALLY INVOLVES OUTER HALF OF MYOMETRIUM. - CERVIX, RIGHT OVARY AND BILATERAL FALLOPIAN TUBES FREE OF TUMOR. LEFT OVARY: - NEUROENDOCRINE TUMOR. - SEE COMMENT. Microscopic Comment 4. ONCOLOGY TABLE-UTERUS, CARCINOMA OR CARCINOSARCOMA Specimen: Uterus with bilateral ovaries and fallopian tubes with right and left obturator sentinel lymph nodes and peritoneal nodule biopsy. Procedure: Hysterectomy with bilateral salpingo-oophorectomy with right and left obturator lymph node biopsies. Lymph node sampling performed: Yes. Specimen integrity: Intact with cervix and body separated. Maximum tumor size: 3.7 cm Histologic type: High grade serous carcinoma. Grade: III. Myometrial invasion: 1.2 cm where myometrium is 2 cm in thickness. Cervical stromal involvement: No. Extent of involvement of other organs: High grade serous carcinoma involving endometrium and myometrium. Lymph - vascular invasion: No  Peritoneal washings: Pending. Lymph nodes: Examined: 7 Sentinel 0 Non-sentinel 7 Total Lymph nodes with metastasis: 0 Isolated tumor cells (< 0.2 mm): 0 Micrometastasis: (> 0.2 mm and < 2.0 mm): 0 Macrometastasis: (> 2.0 mm): 0 Extracapsular extension: N/A Pelvic lymph nodes: 0 involved of 8 lymph nodes. Para-aortic lymph nodes: 0 involved of 0 lymph nodes. Other (specify involvement and site): N/A TNM code: pT1b, pN0 FIGO Stage (based on pathologic findings, needs clinical correlation): IB. COMMENT: The endometrial tumor is 3.7 cm in  greatest dimension and is a high grade serous  carcinoma which focally invades the outer half of the myometrium.  The left ovary shows a 1 cm nodule of neoplasm characterized by sheets of cells with uniform, round to oval nuclei with rare mitotic figures. Iimmunohistochemistry is performed and the left ovarian tumor is positive with cytokeratin AE1/AE3, CD56, chromogranin, synaptophysin and CDX-2. The tumor is negative with inhibin, WT-1, calretinin, CD99, cytokerain 20, cytokeratin 7, estrogen receptor, progesterone receptor, vimentin, and p53. The morphology and immunophenotype are consistent with a low to intermediate grade neuroendocrine tumor and CDX-2 positivity suggests possible gastrointestinal origin. There is a similar 0.6 cm nodule in the paraovarian connective tissue and the peritoneal nodule (part 2) has identical features.       12/21/2017 Imaging    Prior hysterectomy. No evidence of residual or metastatic carcinoma within the abdomen or pelvis.  Tiny indeterminate bilateral upper lobe pulmonary nodules, likely postinflammatory in etiology although metastatic disease cannot definitely be excluded. Recommend continued follow-up by chest CT in 6 months.  Dominant 2.4 cm left thyroid lobe nodule. Consider thyroid ultrasound for further evaluation.       Since surgery, she denies further vaginal bleeding. She denies recent weight loss. Her wound is healing well without any concerns for postoperative infection. The patient had prior colonoscopy approximately a year ago that was unremarkable although report is not available.  MEDICAL HISTORY:  Past Medical History:  Diagnosis Date  . Anemia   . Dental bridge present    front upper tooth  . endometrial ca dx'd 10/2017  . Fibroid   . Hypercholesteremia   . Hypertension    followed by pcp  . Nocturia   . PONV (postoperative nausea and vomiting)     SURGICAL HISTORY: Past Surgical History:  Procedure Laterality Date  .  CHOLECYSTECTOMY OPEN  1974  . DILATION AND CURETTAGE OF UTERUS  1980s  . ROBOTIC ASSISTED TOTAL HYSTERECTOMY WITH BILATERAL SALPINGO OOPHERECTOMY Bilateral 12/01/2017   Procedure: XI ROBOTIC ASSISTED TOTAL HYSTERECTOMY WITH BILATERAL SALPINGO OOPHORECTOMY WITH SENTINAL LYMPH NODES;  Surgeon: Everitt Amber, MD;  Location: [facility];  Service: Gynecology;  Laterality: Bilateral;  . STRABISMUS SURGERY  age 71   unilateral    SOCIAL HISTORY: Social History   Socioeconomic History  . Marital status: Divorced    Spouse name: Not on file  . Number of children: Not on file  . Years of education: Not on file  . Highest education level: Not on file  Social Needs  . Financial resource strain: Not on file  . Food insecurity - worry: Not on file  . Food insecurity - inability: Not on file  . Transportation needs - medical: Not on file  . Transportation needs - non-medical: Not on file  Occupational History  . Occupation: Haematologist  Tobacco Use  . Smoking status: Never Smoker  . Smokeless tobacco: Never Used  Substance and Sexual Activity  . Alcohol use: No  . Drug use: No  . Sexual activity: Not on file  Other Topics Concern  . Not on file  Social History Narrative  . Not on file    FAMILY HISTORY: Family History  Problem Relation Age of Onset  . Colon cancer Other   . Cancer Father        prostate ca    ALLERGIES:  is allergic to fish oil; simvastatin; codeine; and sulfa antibiotics.  MEDICATIONS:  Current Outpatient Medications  Medication Sig Dispense Refill  . acetaminophen (TYLENOL) 500 MG tablet Take 1,000 mg by mouth 2 (two)  times daily as needed for moderate pain or headache.    . benazepril-hydrochlorthiazide (LOTENSIN HCT) 20-12.5 MG tablet Take 2 tablets by mouth every morning.   1  . calcium carbonate (TUMS - DOSED IN MG ELEMENTAL CALCIUM) 500 MG chewable tablet Chew 2 tablets by mouth daily.    . fluticasone (FLONASE) 50 MCG/ACT nasal spray Place 1 spray into  both nostrils daily as needed for allergies or rhinitis.    Marland Kitchen ibuprofen (ADVIL,MOTRIN) 200 MG tablet Take 400-600 mg by mouth 2 (two) times daily as needed for moderate pain.     . Multiple Vitamin (MULTIVITAMIN WITH MINERALS) TABS tablet Take 1 tablet by mouth daily.     No current facility-administered medications for this visit.     REVIEW OF SYSTEMS:   Constitutional: Denies fevers, chills or abnormal night sweats Eyes: Denies blurriness of vision, double vision or watery eyes Ears, nose, mouth, throat, and face: Denies mucositis or sore throat Respiratory: Denies cough, dyspnea or wheezes Cardiovascular: Denies palpitation, chest discomfort or lower extremity swelling Gastrointestinal:  Denies nausea, heartburn or change in bowel habits Skin: Denies abnormal skin rashes Lymphatics: Denies new lymphadenopathy or easy bruising Neurological:Denies numbness, tingling or new weaknesses Behavioral/Psych: Mood is stable, no new changes  All other systems were reviewed with the patient and are negative.  PHYSICAL EXAMINATION: ECOG PERFORMANCE STATUS: 1 - Symptomatic but completely ambulatory Temperature 98 Fahrenheit Pulse 55 Respiration rate 18 Blood pressure 122/60 GENERAL:alert, no distress and comfortable.  She appears very nervous SKIN: skin color, texture, turgor are normal, no rashes or significant lesions EYES: normal, conjunctiva are pink and non-injected, sclera clear OROPHARYNX:no exudate, no erythema and lips, buccal mucosa, and tongue normal  NECK: supple, thyroid normal size, non-tender, without nodularity LYMPH:  no palpable lymphadenopathy in the cervical, axillary or inguinal LUNGS: clear to auscultation and percussion with normal breathing effort HEART: regular rate & rhythm and no murmurs and no lower extremity edema ABDOMEN:abdomen soft, non-tender and normal bowel sounds.  Well-healed surgical scars Musculoskeletal:no cyanosis of digits and no clubbing  PSYCH:  alert & oriented x 3 with fluent speech.  She cried profusely at the prospect of not able to work during chemotherapy and anticipated side effects of treatment NEURO: no focal motor/sensory deficits  LABORATORY DATA:  I have reviewed the data as listed Lab Results  Component Value Date   WBC 8.8 12/02/2017   HGB 9.1 (L) 12/02/2017   HCT 26.9 (L) 12/02/2017   MCV 82.5 12/02/2017   PLT 312 12/02/2017   Recent Labs    11/17/17 1125 11/25/17 0848 12/02/17 0436  NA 139 135 133*  K 4.6 3.9 4.5  CL 106 103 105  CO2 22 23 20*  GLUCOSE 86 88 134*  BUN 20 17 21*  CREATININE 0.93 1.00 0.89  CALCIUM 10.2 9.5 8.7*  GFRNONAA >60 56* >60  GFRAA >60 >60 >60  PROT  --  7.5  --   ALBUMIN  --  4.0  --   AST  --  21  --   ALT  --  21  --   ALKPHOS  --  74  --   BILITOT  --  0.2*  --     RADIOGRAPHIC STUDIES: I have personally reviewed the radiological images as listed and agreed with the findings in the report. Ct Chest W Contrast  Result Date: 12/21/2017 CLINICAL DATA:  Newly diagnosed high-grade endometrial carcinoma. 3 weeks postop from [facility]. Staging. EXAM: CT CHEST, ABDOMEN, AND PELVIS WITH  CONTRAST TECHNIQUE: Multidetector CT imaging of the chest, abdomen and pelvis was performed following the standard protocol during bolus administration of intravenous contrast. CONTRAST:  144m ISOVUE-300 IOPAMIDOL (ISOVUE-300) INJECTION 61% COMPARISON:  AP CT on 11/23/2017 FINDINGS: CT CHEST FINDINGS Cardiovascular: No acute findings. Mediastinum/Lymph Nodes: No pathologically enlarged lymph nodes identified. Dominant nodule seen in the inferior left thyroid lobe measuring 2.4 cm. Lungs/Pleura: No pulmonary infiltrate or pleural effusion. A few tiny scattered bilateral upper lobe pulmonary nodules are seen measuring less than 5 mm, which are indeterminate. Musculoskeletal:  No suspicious bone lesions identified. CT ABDOMEN AND PELVIS FINDINGS Hepatobiliary: No masses identified. Prior cholecystectomy.  No evidence of biliary obstruction. Pancreas:  No mass or inflammatory changes. Spleen:  Within normal limits in size and appearance. Adrenals/Urinary tract: No masses or hydronephrosis. Unremarkable unopacified urinary bladder. Stomach/Bowel: No evidence of obstruction, inflammatory process, or abnormal fluid collections. Diverticulosis is seen mainly involving the sigmoid colon, however there is no evidence of diverticulitis. Vascular/Lymphatic: No pathologically enlarged lymph nodes identified. No abdominal aortic aneurysm. Reproductive: Prior hysterectomy noted. Adnexal regions are unremarkable in appearance. No pelvic mass or abnormal fluid collections identified. Other:  None. Musculoskeletal:  No suspicious bone lesions identified. IMPRESSION: Prior hysterectomy. No evidence of residual or metastatic carcinoma within the abdomen or pelvis. Tiny indeterminate bilateral upper lobe pulmonary nodules, likely postinflammatory in etiology although metastatic disease cannot definitely be excluded. Recommend continued follow-up by chest CT in 6 months. Dominant 2.4 cm left thyroid lobe nodule. Consider thyroid ultrasound for further evaluation. This follows ACR consensus guidelines: Managing Incidental Thyroid Nodules Detected on Imaging: White Paper of the ACR Incidental Thyroid Findings Committee. J Am Coll Radiol 2015; 12:143-150. Electronically Signed   By: JEarle GellM.D.   On: 12/21/2017 15:55   Ct Abdomen Pelvis W Contrast  Result Date: 12/21/2017 CLINICAL DATA:  Newly diagnosed high-grade endometrial carcinoma. 3 weeks postop from [facility]-BS[facility]. Staging. EXAM: CT CHEST, ABDOMEN, AND PELVIS WITH CONTRAST TECHNIQUE: Multidetector CT imaging of the chest, abdomen and pelvis was performed following the standard protocol during bolus administration of intravenous contrast. CONTRAST:  1010mISOVUE-300 IOPAMIDOL (ISOVUE-300) INJECTION 61% COMPARISON:  AP CT on 11/23/2017 FINDINGS: CT CHEST FINDINGS Cardiovascular: No  acute findings. Mediastinum/Lymph Nodes: No pathologically enlarged lymph nodes identified. Dominant nodule seen in the inferior left thyroid lobe measuring 2.4 cm. Lungs/Pleura: No pulmonary infiltrate or pleural effusion. A few tiny scattered bilateral upper lobe pulmonary nodules are seen measuring less than 5 mm, which are indeterminate. Musculoskeletal:  No suspicious bone lesions identified. CT ABDOMEN AND PELVIS FINDINGS Hepatobiliary: No masses identified. Prior cholecystectomy. No evidence of biliary obstruction. Pancreas:  No mass or inflammatory changes. Spleen:  Within normal limits in size and appearance. Adrenals/Urinary tract: No masses or hydronephrosis. Unremarkable unopacified urinary bladder. Stomach/Bowel: No evidence of obstruction, inflammatory process, or abnormal fluid collections. Diverticulosis is seen mainly involving the sigmoid colon, however there is no evidence of diverticulitis. Vascular/Lymphatic: No pathologically enlarged lymph nodes identified. No abdominal aortic aneurysm. Reproductive: Prior hysterectomy noted. Adnexal regions are unremarkable in appearance. No pelvic mass or abnormal fluid collections identified. Other:  None. Musculoskeletal:  No suspicious bone lesions identified. IMPRESSION: Prior hysterectomy. No evidence of residual or metastatic carcinoma within the abdomen or pelvis. Tiny indeterminate bilateral upper lobe pulmonary nodules, likely postinflammatory in etiology although metastatic disease cannot definitely be excluded. Recommend continued follow-up by chest CT in 6 months. Dominant 2.4 cm left thyroid lobe nodule. Consider thyroid ultrasound for further evaluation. This follows ACR consensus guidelines:  Managing Incidental Thyroid Nodules Detected on Imaging: White Paper of the ACR Incidental Thyroid Findings Committee. J Am Coll Radiol 2015; 12:143-150. Electronically Signed   By: Earle Gell M.D.   On: 12/21/2017 15:55   Ct Abdomen Pelvis W  Contrast  Result Date: 11/23/2017 CLINICAL DATA:  Newly diagnosed endometrial cancer. Abdominal cramping and vaginal bleeding for 1 month. EXAM: CT ABDOMEN AND PELVIS WITH CONTRAST TECHNIQUE: Multidetector CT imaging of the abdomen and pelvis was performed using the standard protocol following bolus administration of intravenous contrast. CONTRAST:  189m ISOVUE-300 IOPAMIDOL (ISOVUE-300) INJECTION 61% COMPARISON:  None. FINDINGS: Lower chest: The lung bases are clear of acute process. No pleural effusion or pulmonary lesions. The heart is normal in size. No pericardial effusion. The distal esophagus and aorta are unremarkable. Hepatobiliary: No focal hepatic lesions or intrahepatic biliary dilatation. The gallbladder is surgically absent. No common bile duct dilatation. Pancreas: No mass, inflammation or ductal dilatation. Prominent fatty interstices. Spleen: Normal size.  No focal lesions. Adrenals/Urinary Tract: The adrenal glands and kidneys are unremarkable. No renal, ureteral or bladder calculi or mass. The delayed images do not demonstrate any collecting system abnormalities. Stomach/Bowel: The stomach, duodenum, small bowel and colon are unremarkable. No acute inflammatory changes, mass lesions or obstructive findings. The terminal ileum is normal. The appendix is not identified. Colonic diverticulosis without findings for acute diverticulitis. Vascular/Lymphatic: Minimal scattered distal aortic and proximal iliac artery calcifications. No aneurysm. The branch vessels are patent. The major venous structures are patent. There are small scattered mesenteric and retroperitoneal lymph nodes along with small scattered probable peritoneal implants. Irregular nodal lesion or implant in the central small bowel mesentery image number 62 measures 18 x 18 mm. No pelvic or inguinal adenopathy. Reproductive: The endometrium is markedly thickened and very heterogeneous. The endometrium is thickened up to 5 cm. No obvious  serosal involvement or direct extrauterine spread. There are small uterine fibroids noted. The cervix is grossly normal. The ovaries are normal. Other: No free pelvic fluid collections. No abdominal wall hernia or subcutaneous lesions. Musculoskeletal: No significant bony findings. IMPRESSION: 1. Grossly thickened and heterogeneous endometrium consistent with known endometrial carcinoma. No CT findings to suggest serosal or extra uterine direct extension. No pelvic adenopathy. 2. There are worrisome small peritoneal nodules and a small mesenteric nodal mass in the root of the small bowel mesentery suspicious for metastatic disease. 3. No metastatic pulmonary nodules at the lung bases or evidence of solid abdominal organ metastatic disease. Electronically Signed   By: PMarijo SanesM.D.   On: 11/23/2017 15:58    ASSESSMENT & PLAN:  Endometrial cancer ([facility] The patient's son, SAnnie Main is visibly upset over the prospect of the patient is recommended to receive systemic chemotherapy with radiation. He was under the impression that the patient should not need significant treatment if the surgeon has complete resection of the primary tumor & "got it all" with negative imaging studies He stepped outside the room several times and appears to be in anger/disappointment. I tried to talk to the patient and her son in the compassionate manner, explaining to them the role of adjuvant treatment of curative intent. Given the high-grade nature of her disease, without adjuvant chemotherapy, there is a little high likelihood that her disease may relapse in the future. Her son wanted a guarantee that after adjuvant treatment, she would be 100% alive and not eventually succumb to recurrence of cancer I told the patient's son that I cannot guarantee that. I will try my very best  to support her through her adjuvant treatment and I estimated her prognosis is excellent given her good state of health Even if her endometrial  cancer does not relapse in her lifetime, we still have to follow her closely for the neuroendocrine tumor of unknown origin. Her son wanted that the patient to go home and think about the prospect of adjuvant treatment. However, the patient has made an informed decision to pursue adjuvant treatment I recommend chemo education class, port placement and chemotherapy consent before we start her treatment, anticipated start date of treatment to be on 01/02/2018. The final pathology report revealed that the patient has MSI high disease She would qualify for genetic testing but given that the patient appears overwhelmed, we discussed with her for the next week  Neuroendocrine carcinoma, unknown primary site [facility]) CT imaging did not review obvious source of neuroendocrine tumor. She is not symptomatic I plan to refer her to GI service for EGD and colonoscopy and will order for neuroendocrine testing to follow-up on this in the future.  Thyroid nodule CT imaging revealed incidental left thyroid nodule She is not symptomatic I will order thyroid function test next week to follow  Orders Placed This Encounter  Procedures  . IR FLUORO GUIDE PORT INSERTION RIGHT    Standing Status:   Future    Standing Expiration Date:   02/22/2019    Order Specific Question:   Reason for Exam (SYMPTOM  OR DIAGNOSIS REQUIRED)    Answer:   need port for chemo scheduled to start around 3/19    Order Specific Question:   Preferred Imaging Location?    Answer:   [facility]  . CBC with Differential ([place] Only)    Standing Status:   Standing    Number of Occurrences:   20    Standing Expiration Date:   12/24/2018  . CMP ([place] only)    Standing Status:   Standing    Number of Occurrences:   20    Standing Expiration Date:   12/24/2018  . Chromogranin A    Standing Status:   Future    Standing Expiration Date:   01/27/2019  . TSH    Standing Status:   Future    Standing Expiration Date:    01/27/2019  . T4, free    Standing Status:   Future    Standing Expiration Date:   01/27/2019     All questions were answered. The patient knows to call the clinic with any problems, questions or concerns. I spent 60 minutes counseling the patient face to face. The total time spent in the appointment was 80 minutes and more than 50% was on counseling.     Heath Lark, MD 12/23/2017 7:32 AM

## 2017-12-26 ENCOUNTER — Other Ambulatory Visit: Payer: Self-pay | Admitting: Radiology

## 2017-12-27 ENCOUNTER — Encounter: Payer: Self-pay | Admitting: Radiation Oncology

## 2017-12-27 ENCOUNTER — Inpatient Hospital Stay (HOSPITAL_BASED_OUTPATIENT_CLINIC_OR_DEPARTMENT_OTHER): Payer: No Typology Code available for payment source | Admitting: Hematology and Oncology

## 2017-12-27 ENCOUNTER — Encounter: Payer: Self-pay | Admitting: Hematology and Oncology

## 2017-12-27 ENCOUNTER — Inpatient Hospital Stay: Payer: No Typology Code available for payment source

## 2017-12-27 VITALS — BP 138/51 | HR 55 | Temp 97.5°F | Resp 18 | Ht 64.0 in | Wt 223.6 lb

## 2017-12-27 DIAGNOSIS — C7A8 Other malignant neuroendocrine tumors: Secondary | ICD-10-CM

## 2017-12-27 DIAGNOSIS — C541 Malignant neoplasm of endometrium: Secondary | ICD-10-CM

## 2017-12-27 DIAGNOSIS — E041 Nontoxic single thyroid nodule: Secondary | ICD-10-CM

## 2017-12-27 DIAGNOSIS — Z5111 Encounter for antineoplastic chemotherapy: Secondary | ICD-10-CM | POA: Diagnosis not present

## 2017-12-27 LAB — CMP (CANCER CENTER ONLY)
ALT: 15 U/L (ref 0–55)
AST: 15 U/L (ref 5–34)
Albumin: 4 g/dL (ref 3.5–5.0)
Alkaline Phosphatase: 83 U/L (ref 40–150)
Anion gap: 8 (ref 3–11)
BUN: 15 mg/dL (ref 7–26)
CO2: 25 mmol/L (ref 22–29)
Calcium: 9.8 mg/dL (ref 8.4–10.4)
Chloride: 104 mmol/L (ref 98–109)
Creatinine: 0.99 mg/dL (ref 0.60–1.10)
GFR, Est AFR Am: >60 mL/min (ref >60)
GFR, Estimated: 57 mL/min — ABNORMAL LOW (ref >60)
Glucose, Bld: 90 mg/dL (ref 70–140)
Potassium: 3.8 mmol/L (ref 3.5–5.1)
Sodium: 137 mmol/L (ref 136–145)
Total Bilirubin: 0.4 mg/dL (ref 0.2–1.2)
Total Protein: 7.3 g/dL (ref 6.4–8.3)

## 2017-12-27 LAB — CBC WITH DIFFERENTIAL (CANCER CENTER ONLY)
Basophils Absolute: 0.1 10*3/uL (ref 0.0–0.1)
Basophils Relative: 1 %
Eosinophils Absolute: 0.3 10*3/uL (ref 0.0–0.5)
Eosinophils Relative: 6 %
HCT: 34.3 % — ABNORMAL LOW (ref 34.8–46.6)
Hemoglobin: 11.3 g/dL — ABNORMAL LOW (ref 11.6–15.9)
Lymphocytes Relative: 20 %
Lymphs Abs: 1.2 10*3/uL (ref 0.9–3.3)
MCH: 27.2 pg (ref 25.1–34.0)
MCHC: 33 g/dL (ref 31.5–36.0)
MCV: 82.5 fL (ref 79.5–101.0)
Monocytes Absolute: 0.3 10*3/uL (ref 0.1–0.9)
Monocytes Relative: 5 %
Neutro Abs: 4.1 10*3/uL (ref 1.5–6.5)
Neutrophils Relative %: 68 %
Platelet Count: 320 10*3/uL (ref 145–400)
RBC: 4.15 MIL/uL (ref 3.70–5.45)
RDW: 14.8 % — ABNORMAL HIGH (ref 11.2–14.5)
WBC Count: 5.9 10*3/uL (ref 3.9–10.3)

## 2017-12-27 LAB — TSH: TSH: 1.31 u[IU]/mL (ref 0.308–3.960)

## 2017-12-27 LAB — T4, FREE: Free T4: 1.11 ng/dL (ref 0.61–1.12)

## 2017-12-27 MED ORDER — DEXAMETHASONE 4 MG PO TABS: ORAL_TABLET | ORAL | 0 refills | Status: DC

## 2017-12-27 MED ORDER — PROCHLORPERAZINE MALEATE 10 MG PO TABS: 10.0000 mg | ORAL_TABLET | Freq: Four times a day (QID) | ORAL | 1 refills | Status: DC | PRN

## 2017-12-27 MED ORDER — LIDOCAINE-PRILOCAINE 2.5-2.5 % EX CREA: TOPICAL_CREAM | CUTANEOUS | 3 refills | Status: DC

## 2017-12-27 MED ORDER — ONDANSETRON HCL 8 MG PO TABS: 8.0000 mg | ORAL_TABLET | Freq: Three times a day (TID) | ORAL | 1 refills | Status: DC | PRN

## 2017-12-27 MED FILL — ONDANSETRON HCL 8 MG TABS: 8 | 10 days supply | Qty: 30 | Fill #0

## 2017-12-27 MED FILL — PROCHLORPERAZINE 10 MG TAB: 10 | 7 days supply | Qty: 30 | Fill #0

## 2017-12-27 MED FILL — LIDOCAINE-PRILOCAINE CREAM: 2.5-2.5 | 20 days supply | Qty: 30 | Fill #0

## 2017-12-27 MED FILL — DEXAMETHASONE 4 MG TABLET: 4 | 42 days supply | Qty: 60 | Fill #0

## 2017-12-27 NOTE — Assessment & Plan Note (Signed)
CT imaging did not review obvious source of neuroendocrine tumor. She is not symptomatic I plan to refer her to GI service for EGD and colonoscopy and will order for neuroendocrine testing to follow-up on this in the future after she has completed adjuvant treatment.

## 2017-12-27 NOTE — Assessment & Plan Note (Signed)
CT imaging revealed incidental left thyroid nodule She is not symptomatic Thyroid function tests are within normal limits.  I recommend observation only and consider referral to endocrinologist after completion of adjuvant treatment.

## 2017-12-27 NOTE — Progress Notes (Signed)
[place] OFFICE PROGRESS NOTE  Patient Care Team: Kelton Pillar, MD as PCP - General [facility] Medicine)  ASSESSMENT & PLAN:  Endometrial cancer [facility]) We reviewed the NCCN guidelines We discussed the role of chemotherapy. The intent is of curative intent.  We discussed some of the risks, benefits, side-effects of carboplatin & Taxol. Treatment is intravenous, every 3 weeks x 6 cycles  Some of the short term side-effects included, though not limited to, including weight loss, life threatening infections, risk of allergic reactions, need for transfusions of blood products, nausea, vomiting, change in bowel habits, loss of hair, admission to hospital for various reasons, and risks of death.   Long term side-effects are also discussed including risks of infertility, permanent damage to nerve function, hearing loss, chronic fatigue, kidney damage with possibility needing hemodialysis, and rare secondary malignancy including bone marrow disorders.  The patient is aware that the response rates discussed earlier is not guaranteed.  After a long discussion, patient made an informed decision to proceed with the prescribed plan of care.   Patient education material was dispensed. We discussed premedication with dexamethasone before chemotherapy. 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) CT imaging did not review obvious source of neuroendocrine tumor. She is not symptomatic I plan to refer her to GI service for EGD and colonoscopy and will order for neuroendocrine testing to follow-up on this in the future after she has completed adjuvant  treatment.  Thyroid nodule CT imaging revealed incidental left thyroid nodule She is not symptomatic Thyroid function tests are within normal limits.  I recommend observation only and consider referral to endocrinologist after completion of adjuvant treatment.   Orders Placed This Encounter  Procedures  . Ambulatory referral to Radiation Oncology    Referral Priority:   Routine    Referral Type:   Consultation    Referral Reason:   Specialty Services Required    Referred to Provider:   Gery Pray, MD    Requested Specialty:   Radiation Oncology    Number of Visits Requested:   1    INTERVAL HISTORY: Please see below for problem oriented charting. She returns with her best friend, Jocelyn Lamer today for chemotherapy consent She feels well Denies recent nausea, abdominal symptoms or changes in bowel habits She has a lot of questions related to expected side effects from treatment. Radiation oncologist appointment has not been set up yet. The patient is not keen to follow-up with genetic counselor related to abnormal MSI testing.  She would like to think about it and has cancel her recent referral.  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 - SEE COMMENT. Microscopic Comment Sections are of a high grade carcinoma with mixed glandular, papillary and solid patterns. The histomorphology and immunostain pattern of ER negative, PR negative, p53 positive and p16 positive is compatible with a serous carcinoma. Internal departmental review obtained (Dr. Lyndon Code) with agreement. The results were called to Dr. Nelda Marseille on  on 11/14/17. (MEG:kh 11/14/17)      11/23/2017 Imaging    1. Grossly thickened and heterogeneous endometrium consistent with known  endometrial carcinoma. No CT findings to suggest serosal or extra uterine direct extension. No pelvic adenopathy.  2. There are worrisome small peritoneal nodules and a small mesenteric nodal mass in the root of the small bowel mesentery suspicious for metastatic disease. 3. No metastatic pulmonary nodules at the lung bases or evidence of solid abdominal organ metastatic disease.       12/01/2017 Pathology Results    1. Lymph node, sentinel, biopsy, right obturator - ONE BENIGN LYMPH NODE (0/1). 2. Peritoneum, biopsy, peritoneal nodule - NEUROENDOCRINE TUMOR. - SEE COMMENT. 3. Lymph node, sentinel, biopsy, left obturator - SEVEN BENIGN LYMPH NODES (0/7). 4. Uterus +/- tubes/ovaries, neoplastic ENDOMETRIUM. - HIGH GRADE SEROUS CARCINOMA, 3.7 CM. - CARCINOMA FOCALLY INVOLVES OUTER HALF OF MYOMETRIUM. - CERVIX, RIGHT OVARY AND BILATERAL FALLOPIAN TUBES FREE OF TUMOR. LEFT OVARY: - NEUROENDOCRINE TUMOR. - SEE COMMENT. Microscopic Comment 4. ONCOLOGY TABLE-UTERUS, CARCINOMA OR CARCINOSARCOMA Specimen: Uterus with bilateral ovaries and fallopian tubes with right and left obturator sentinel lymph nodes and peritoneal nodule biopsy. Procedure: Hysterectomy with bilateral salpingo-oophorectomy with right and left obturator lymph node biopsies. Lymph node sampling performed: Yes. Specimen integrity: Intact with cervix and body separated. Maximum tumor size: 3.7 cm Histologic type: High grade serous carcinoma. Grade: III. Myometrial invasion: 1.2 cm where myometrium is 2 cm in thickness. Cervical stromal involvement: No. Extent of involvement of other organs: High grade serous carcinoma involving endometrium and myometrium. Lymph - vascular invasion: No  Peritoneal washings: Pending. Lymph nodes: Examined: 7 Sentinel 0 Non-sentinel 7 Total Lymph nodes with metastasis: 0 Isolated tumor cells (< 0.2 mm): 0 Micrometastasis: (> 0.2 mm and < 2.0 mm): 0 Macrometastasis: (> 2.0 mm):  0 Extracapsular extension: N/A Pelvic lymph nodes: 0 involved of 8 lymph nodes. Para-aortic lymph nodes: 0 involved of 0 lymph nodes. Other (specify involvement and site): N/A TNM code: pT1b, pN0 FIGO Stage (based on pathologic findings, needs clinical correlation): IB. COMMENT: The endometrial tumor is 3.7 cm in greatest dimension and is a high grade serous carcinoma which focally invades the outer half of the myometrium.  The left ovary shows a 1 cm nodule of neoplasm characterized by sheets of cells with uniform, round to oval nuclei with rare mitotic figures. Iimmunohistochemistry is performed and the left ovarian tumor is positive with cytokeratin AE1/AE3, CD56, chromogranin, synaptophysin and CDX-2. The tumor is negative with inhibin, WT-1, calretinin, CD99, cytokerain 20, cytokeratin 7, estrogen receptor, progesterone receptor, vimentin, and p53. The morphology and immunophenotype are consistent with a low to intermediate grade neuroendocrine tumor and CDX-2 positivity suggests possible gastrointestinal origin. There is a similar 0.6 cm nodule in the paraovarian connective tissue and the peritoneal nodule (part 2) has identical features.       12/21/2017 Imaging    Prior hysterectomy. No evidence of residual or metastatic carcinoma within the abdomen or pelvis.  Tiny indeterminate bilateral upper lobe pulmonary nodules, likely postinflammatory in etiology although metastatic disease cannot definitely be excluded. Recommend continued follow-up by chest CT in 6 months.  Dominant 2.4 cm left thyroid lobe nodule. Consider thyroid ultrasound for further evaluation.        REVIEW OF SYSTEMS:   Constitutional: Denies fevers, chills or abnormal weight loss Eyes: Denies blurriness of vision Ears, nose, mouth, throat, and face: Denies mucositis or sore throat Respiratory: Denies cough, dyspnea or wheezes Cardiovascular: Denies palpitation, chest discomfort  or lower extremity  swelling Gastrointestinal:  Denies nausea, heartburn or change in bowel habits Skin: Denies abnormal skin rashes Lymphatics: Denies new lymphadenopathy or easy bruising Neurological:Denies numbness, tingling or new weaknesses Behavioral/Psych: Mood is stable, no new changes  All other systems were reviewed with the patient and are negative.  I have reviewed the past medical history, past surgical history, social history and family history with the patient and they are unchanged from previous note.  ALLERGIES:  is allergic to fish oil; simvastatin; codeine; and sulfa antibiotics.  MEDICATIONS:  Current Outpatient Medications  Medication Sig Dispense Refill  . acetaminophen (TYLENOL) 500 MG tablet Take 1,000 mg by mouth 2 (two) times daily as needed for moderate pain or headache.    . benazepril-hydrochlorthiazide (LOTENSIN HCT) 20-12.5 MG tablet Take 2 tablets by mouth every morning.   1  . calcium carbonate (TUMS - DOSED IN MG ELEMENTAL CALCIUM) 500 MG chewable tablet Chew 2 tablets by mouth daily.    Marland Kitchen dexamethasone (DECADRON) 4 MG tablet Take 5 tabs the night before and 5 tabs the morning of chemotherapy, every 3 weeks, with food 60 tablet 0  . fluticasone (FLONASE) 50 MCG/ACT nasal spray Place 1 spray into both nostrils daily as needed for allergies or rhinitis.    Marland Kitchen ibuprofen (ADVIL,MOTRIN) 200 MG tablet Take 400-600 mg by mouth 2 (two) times daily as needed for moderate pain.     Marland Kitchen lidocaine-prilocaine (EMLA) cream Apply to affected area once 30 g 3  . Multiple Vitamin (MULTIVITAMIN WITH MINERALS) TABS tablet Take 1 tablet by mouth daily.    . ondansetron (ZOFRAN) 8 MG tablet Take 1 tablet (8 mg total) by mouth every 8 (eight) hours as needed for refractory nausea / vomiting. Start on day 3 after chemo. 30 tablet 1  . prochlorperazine (COMPAZINE) 10 MG tablet Take 1 tablet (10 mg total) by mouth every 6 (six) hours as needed (Nausea or vomiting). 30 tablet 1   No current  facility-administered medications for this visit.     PHYSICAL EXAMINATION: ECOG PERFORMANCE STATUS: 0 - Asymptomatic  Vitals:   12/27/17 1006  BP: (!) 138/51  Pulse: (!) 55  Resp: 18  Temp: (!) 97.5 F (36.4 C)  SpO2: 100%   Filed Weights   12/27/17 1006  Weight: 223 lb 9.6 oz (101.4 kg)    GENERAL:alert, no distress and comfortable SKIN: skin color, texture, turgor are normal, no rashes or significant lesions EYES: normal, Conjunctiva are pink and non-injected, sclera clear OROPHARYNX:no exudate, no erythema and lips, buccal mucosa, and tongue normal  NECK: supple, thyroid normal size, non-tender, without nodularity LYMPH:  no palpable lymphadenopathy in the cervical, axillary or inguinal LUNGS: clear to auscultation and percussion with normal breathing effort HEART: regular rate & rhythm and no murmurs and no lower extremity edema ABDOMEN:abdomen soft, non-tender and normal bowel sounds Musculoskeletal:no cyanosis of digits and no clubbing  NEURO: alert & oriented x 3 with fluent speech, no focal motor/sensory deficits  LABORATORY DATA:  I have reviewed the data as listed    Component Value Date/Time   NA 137 12/27/2017 0936   K 3.8 12/27/2017 0936   CL 104 12/27/2017 0936   CO2 25 12/27/2017 0936   GLUCOSE 90 12/27/2017 0936   BUN 15 12/27/2017 0936   CREATININE 0.99 12/27/2017 0936   CALCIUM 9.8 12/27/2017 0936   PROT 7.3 12/27/2017 0936   ALBUMIN 4.0 12/27/2017 0936   AST 15 12/27/2017 0936   ALT 15 12/27/2017 0936  ALKPHOS 83 12/27/2017 0936   BILITOT 0.4 12/27/2017 0936   GFRNONAA 57 (L) 12/27/2017 0936   GFRAA >60 12/27/2017 0936    No results found for: SPEP, UPEP  Lab Results  Component Value Date   WBC 5.9 12/27/2017   NEUTROABS 4.1 12/27/2017   HGB 9.1 (L) 12/02/2017   HCT 34.3 (L) 12/27/2017   MCV 82.5 12/27/2017   PLT 320 12/27/2017      Chemistry      Component Value Date/Time   NA 137 12/27/2017 0936   K 3.8 12/27/2017 0936   CL  104 12/27/2017 0936   CO2 25 12/27/2017 0936   BUN 15 12/27/2017 0936   CREATININE 0.99 12/27/2017 0936      Component Value Date/Time   CALCIUM 9.8 12/27/2017 0936   ALKPHOS 83 12/27/2017 0936   AST 15 12/27/2017 0936   ALT 15 12/27/2017 0936   BILITOT 0.4 12/27/2017 0936       RADIOGRAPHIC STUDIES: I have personally reviewed the radiological images as listed and agreed with the findings in the report. Ct Chest W Contrast  Result Date: 12/21/2017 CLINICAL DATA:  Newly diagnosed high-grade endometrial carcinoma. 3 weeks postop from TAH-BSO. Staging. EXAM: CT CHEST, ABDOMEN, AND PELVIS WITH CONTRAST TECHNIQUE: Multidetector CT imaging of the chest, abdomen and pelvis was performed following the standard protocol during bolus administration of intravenous contrast. CONTRAST:  147m ISOVUE-300 IOPAMIDOL (ISOVUE-300) INJECTION 61% COMPARISON:  AP CT on 11/23/2017 FINDINGS: CT CHEST FINDINGS Cardiovascular: No acute findings. Mediastinum/Lymph Nodes: No pathologically enlarged lymph nodes identified. Dominant nodule seen in the inferior left thyroid lobe measuring 2.4 cm. Lungs/Pleura: No pulmonary infiltrate or pleural effusion. A few tiny scattered bilateral upper lobe pulmonary nodules are seen measuring less than 5 mm, which are indeterminate. Musculoskeletal:  No suspicious bone lesions identified. CT ABDOMEN AND PELVIS FINDINGS Hepatobiliary: No masses identified. Prior cholecystectomy. No evidence of biliary obstruction. Pancreas:  No mass or inflammatory changes. Spleen:  Within normal limits in size and appearance. Adrenals/Urinary tract: No masses or hydronephrosis. Unremarkable unopacified urinary bladder. Stomach/Bowel: No evidence of obstruction, inflammatory process, or abnormal fluid collections. Diverticulosis is seen mainly involving the sigmoid colon, however there is no evidence of diverticulitis. Vascular/Lymphatic: No pathologically enlarged lymph nodes identified. No abdominal  aortic aneurysm. Reproductive: Prior hysterectomy noted. Adnexal regions are unremarkable in appearance. No pelvic mass or abnormal fluid collections identified. Other:  None. Musculoskeletal:  No suspicious bone lesions identified. IMPRESSION: Prior hysterectomy. No evidence of residual or metastatic carcinoma within the abdomen or pelvis. Tiny indeterminate bilateral upper lobe pulmonary nodules, likely postinflammatory in etiology although metastatic disease cannot definitely be excluded. Recommend continued follow-up by chest CT in 6 months. Dominant 2.4 cm left thyroid lobe nodule. Consider thyroid ultrasound for further evaluation. This follows ACR consensus guidelines: Managing Incidental Thyroid Nodules Detected on Imaging: White Paper of the ACR Incidental Thyroid Findings Committee. J Am Coll Radiol 2015; 12:143-150. Electronically Signed   By: JEarle GellM.D.   On: 12/21/2017 15:55   Ct Abdomen Pelvis W Contrast  Result Date: 12/21/2017 CLINICAL DATA:  Newly diagnosed high-grade endometrial carcinoma. 3 weeks postop from TAH-BSO. Staging. EXAM: CT CHEST, ABDOMEN, AND PELVIS WITH CONTRAST TECHNIQUE: Multidetector CT imaging of the chest, abdomen and pelvis was performed following the standard protocol during bolus administration of intravenous contrast. CONTRAST:  1051mISOVUE-300 IOPAMIDOL (ISOVUE-300) INJECTION 61% COMPARISON:  AP CT on 11/23/2017 FINDINGS: CT CHEST FINDINGS Cardiovascular: No acute findings. Mediastinum/Lymph Nodes: No pathologically enlarged lymph nodes  identified. Dominant nodule seen in the inferior left thyroid lobe measuring 2.4 cm. Lungs/Pleura: No pulmonary infiltrate or pleural effusion. A few tiny scattered bilateral upper lobe pulmonary nodules are seen measuring less than 5 mm, which are indeterminate. Musculoskeletal:  No suspicious bone lesions identified. CT ABDOMEN AND PELVIS FINDINGS Hepatobiliary: No masses identified. Prior cholecystectomy. No evidence of biliary  obstruction. Pancreas:  No mass or inflammatory changes. Spleen:  Within normal limits in size and appearance. Adrenals/Urinary tract: No masses or hydronephrosis. Unremarkable unopacified urinary bladder. Stomach/Bowel: No evidence of obstruction, inflammatory process, or abnormal fluid collections. Diverticulosis is seen mainly involving the sigmoid colon, however there is no evidence of diverticulitis. Vascular/Lymphatic: No pathologically enlarged lymph nodes identified. No abdominal aortic aneurysm. Reproductive: Prior hysterectomy noted. Adnexal regions are unremarkable in appearance. No pelvic mass or abnormal fluid collections identified. Other:  None. Musculoskeletal:  No suspicious bone lesions identified. IMPRESSION: Prior hysterectomy. No evidence of residual or metastatic carcinoma within the abdomen or pelvis. Tiny indeterminate bilateral upper lobe pulmonary nodules, likely postinflammatory in etiology although metastatic disease cannot definitely be excluded. Recommend continued follow-up by chest CT in 6 months. Dominant 2.4 cm left thyroid lobe nodule. Consider thyroid ultrasound for further evaluation. This follows ACR consensus guidelines: Managing Incidental Thyroid Nodules Detected on Imaging: White Paper of the ACR Incidental Thyroid Findings Committee. J Am Coll Radiol 2015; 12:143-150. Electronically Signed   By: Earle Gell M.D.   On: 12/21/2017 15:55    All questions were answered. The patient knows to call the clinic with any problems, questions or concerns. No barriers to learning was detected.  I spent 30 minutes counseling the patient face to face. The total time spent in the appointment was 40 minutes and more than 50% was on counseling and review of test results  Heath Lark, MD 12/27/2017 2:54 PM

## 2017-12-27 NOTE — Assessment & Plan Note (Signed)
We reviewed the NCCN guidelines We discussed the role of chemotherapy. The intent is of curative intent.  We discussed some of the risks, benefits, side-effects of carboplatin & Taxol. Treatment is intravenous, every 3 weeks x 6 cycles  Some of the short term side-effects included, though not limited to, including weight loss, life threatening infections, risk of allergic reactions, need for transfusions of blood products, nausea, vomiting, change in bowel habits, loss of hair, admission to hospital for various reasons, and risks of death.   Long term side-effects are also discussed including risks of infertility, permanent damage to nerve function, hearing loss, chronic fatigue, kidney damage with possibility needing hemodialysis, and rare secondary malignancy including bone marrow disorders.  The patient is aware that the response rates discussed earlier is not guaranteed.  After a long discussion, patient made an informed decision to proceed with the prescribed plan of care.   Patient education material was dispensed. We discussed premedication with dexamethasone before chemotherapy. She has port placement scheduled in 2 days Due to her age, I recommend G-CSF support I will send referral to radiation oncologist to discuss brachytherapy Due to abnormal MSI testing, genetic counseling is recommended but the patient would like to defer for now because she does not feel ready for genetic counseling

## 2017-12-28 ENCOUNTER — Ambulatory Visit: Payer: PRIVATE HEALTH INSURANCE | Admitting: Gynecologic Oncology

## 2017-12-29 ENCOUNTER — Ambulatory Visit (HOSPITAL_COMMUNITY)
Admission: RE | Admit: 2017-12-29 | Discharge: 2017-12-29 | Disposition: A | Discharge status: Home / Self Care | Payer: No Typology Code available for payment source | Source: Ambulatory Visit | Attending: Hematology and Oncology | Admitting: Hematology and Oncology

## 2017-12-29 ENCOUNTER — Encounter (HOSPITAL_COMMUNITY): Payer: Self-pay

## 2017-12-29 ENCOUNTER — Other Ambulatory Visit: Payer: Self-pay | Admitting: Hematology and Oncology

## 2017-12-29 DIAGNOSIS — Z7951 Long term (current) use of inhaled steroids: Secondary | ICD-10-CM | POA: Diagnosis not present

## 2017-12-29 DIAGNOSIS — Z882 Allergy status to sulfonamides status: Secondary | ICD-10-CM | POA: Diagnosis not present

## 2017-12-29 DIAGNOSIS — Z885 Allergy status to narcotic agent status: Secondary | ICD-10-CM | POA: Diagnosis not present

## 2017-12-29 DIAGNOSIS — D3A8 Other benign neuroendocrine tumors: Secondary | ICD-10-CM | POA: Diagnosis not present

## 2017-12-29 DIAGNOSIS — C541 Malignant neoplasm of endometrium: Secondary | ICD-10-CM

## 2017-12-29 DIAGNOSIS — I10 Essential (primary) hypertension: Secondary | ICD-10-CM | POA: Insufficient documentation

## 2017-12-29 DIAGNOSIS — E7800 Pure hypercholesterolemia, unspecified: Secondary | ICD-10-CM | POA: Insufficient documentation

## 2017-12-29 HISTORY — PX: IR FLUORO GUIDE PORT INSERTION RIGHT: IMG5741

## 2017-12-29 HISTORY — PX: IR US GUIDE VASC ACCESS RIGHT: IMG2390

## 2017-12-29 LAB — CBC WITH DIFFERENTIAL/PLATELET
Basophils Absolute: 0 10*3/uL (ref 0.0–0.1)
Basophils Relative: 0 %
Eosinophils Absolute: 0.4 10*3/uL (ref 0.0–0.7)
Eosinophils Relative: 6 %
HCT: 35.4 % — ABNORMAL LOW (ref 36.0–46.0)
Hemoglobin: 11.3 g/dL — ABNORMAL LOW (ref 12.0–15.0)
Lymphocytes Relative: 25 %
Lymphs Abs: 1.7 10*3/uL (ref 0.7–4.0)
MCH: 27 pg (ref 26.0–34.0)
MCHC: 31.9 g/dL (ref 30.0–36.0)
MCV: 84.7 fL (ref 78.0–100.0)
Monocytes Absolute: 0.4 10*3/uL (ref 0.1–1.0)
Monocytes Relative: 6 %
Neutro Abs: 4.1 10*3/uL (ref 1.7–7.7)
Neutrophils Relative %: 63 %
Platelets: 328 10*3/uL (ref 150–400)
RBC: 4.18 MIL/uL (ref 3.87–5.11)
RDW: 14.6 % (ref 11.5–15.5)
WBC: 6.6 10*3/uL (ref 4.0–10.5)

## 2017-12-29 LAB — CHROMOGRANIN A: Chromogranin A: 1 nmol/L (ref 0–5)

## 2017-12-29 LAB — BASIC METABOLIC PANEL
Anion gap: 11 (ref 5–15)
BUN: 21 mg/dL — ABNORMAL HIGH (ref 6–20)
CO2: 22 mmol/L (ref 22–32)
Calcium: 9.4 mg/dL (ref 8.9–10.3)
Chloride: 108 mmol/L (ref 101–111)
Creatinine, Ser: 0.98 mg/dL (ref 0.44–1.00)
GFR calc Af Amer: >60 mL/min (ref >60)
GFR calc non Af Amer: 58 mL/min — ABNORMAL LOW (ref >60)
Glucose, Bld: 89 mg/dL (ref 65–99)
Potassium: 4.2 mmol/L (ref 3.5–5.1)
Sodium: 141 mmol/L (ref 135–145)

## 2017-12-29 LAB — PROTIME-INR
INR: 0.96
Prothrombin Time: 12.7 seconds (ref 11.4–15.2)

## 2017-12-29 MED ORDER — CEFAZOLIN SODIUM-DEXTROSE 2-4 GM/100ML-% IV SOLN
2.0000 g | INTRAVENOUS | Status: AC
  Administered 2017-12-29: 2 g via INTRAVENOUS

## 2017-12-29 MED ORDER — SODIUM CHLORIDE 0.9 % IV SOLN
INTRAVENOUS | Status: DC
  Administered 2017-12-29: 11:00:00 via INTRAVENOUS

## 2017-12-29 MED ORDER — CEFAZOLIN SODIUM-DEXTROSE 2-4 GM/100ML-% IV SOLN
INTRAVENOUS | Status: AC
  Filled 2017-12-29: qty 100

## 2017-12-29 MED ORDER — LIDOCAINE-EPINEPHRINE (PF) 2 %-1:200000 IJ SOLN
INTRAMUSCULAR | Status: AC
  Filled 2017-12-29: qty 20

## 2017-12-29 MED ORDER — MIDAZOLAM HCL 2 MG/2ML IJ SOLN
INTRAMUSCULAR | Status: AC
  Filled 2017-12-29: qty 4

## 2017-12-29 MED ORDER — MIDAZOLAM HCL 2 MG/2ML IJ SOLN
INTRAMUSCULAR | Status: AC | PRN
  Administered 2017-12-29: 1 mg via INTRAVENOUS
  Administered 2017-12-29: 0.5 mg via INTRAVENOUS
  Administered 2017-12-29: 1 mg via INTRAVENOUS

## 2017-12-29 MED ORDER — HEPARIN SOD (PORK) LOCK FLUSH 100 UNIT/ML IV SOLN
INTRAVENOUS | Status: AC
  Filled 2017-12-29: qty 5

## 2017-12-29 MED ORDER — HEPARIN SOD (PORK) LOCK FLUSH 100 UNIT/ML IV SOLN
INTRAVENOUS | Status: AC | PRN
  Administered 2017-12-29: 500 [IU] via INTRAVENOUS

## 2017-12-29 MED ORDER — FENTANYL CITRATE (PF) 100 MCG/2ML IJ SOLN
INTRAMUSCULAR | Status: AC | PRN
  Administered 2017-12-29 (×3): 50 ug via INTRAVENOUS

## 2017-12-29 MED ORDER — FENTANYL CITRATE (PF) 100 MCG/2ML IJ SOLN
INTRAMUSCULAR | Status: AC
  Filled 2017-12-29: qty 4

## 2017-12-29 NOTE — Discharge Instructions (Signed)
You may remove dressing and bathe in 24 hours.  ° °Implanted Port Insertion, Care After °This sheet gives you information about how to care for yourself after your procedure. Your health care provider may also give you more specific instructions. If you have problems or questions, contact your health care provider. °What can I expect after the procedure? °After your procedure, it is common to have: °· Discomfort at the port insertion site. °· Bruising on the skin over the port. This should improve over 3-4 days. ° °Follow these instructions at home: °Port care °· After your port is placed, you will get a manufacturer's information card. The card has information about your port. Keep this card with you at all times. °· Take care of the port as told by your health care provider. Ask your health care provider if you or a family member can get training for taking care of the port at home. A home health care nurse may also take care of the port. °· Make sure to remember what type of port you have. °Incision care °· Follow instructions from your health care provider about how to take care of your port insertion site. Make sure you: °? Wash your hands with soap and water before you change your bandage (dressing). If soap and water are not available, use hand sanitizer. °? Change your dressing as told by your health care provider. °? Leave stitches (sutures), skin glue, or adhesive strips in place. These skin closures may need to stay in place for 2 weeks or longer. If adhesive strip edges start to loosen and curl up, you may trim the loose edges. Do not remove adhesive strips completely unless your health care provider tells you to do that. °· Check your port insertion site every day for signs of infection. Check for: °? More redness, swelling, or pain. °? More fluid or blood. °? Warmth. °? Pus or a bad smell. °General instructions °· Do not take baths, swim, or use a hot tub until your health care provider approves. °· Do  not lift anything that is heavier than 10 lb (4.5 kg) for a week, or as told by your health care provider. °· Ask your health care provider when it is okay to: °? Return to work or school. °? Resume usual physical activities or sports. °· Do not drive for 24 hours if you were given a medicine to help you relax (sedative). °· Take over-the-counter and prescription medicines only as told by your health care provider. °· Wear a medical alert bracelet in case of an emergency. This will tell any health care providers that you have a port. °· Keep all follow-up visits as told by your health care provider. This is important. °Contact a health care provider if: °· You cannot flush your port with saline as directed, or you cannot draw blood from the port. °· You have a fever or chills. °· You have more redness, swelling, or pain around your port insertion site. °· You have more fluid or blood coming from your port insertion site. °· Your port insertion site feels warm to the touch. °· You have pus or a bad smell coming from the port insertion site. °Get help right away if: °· You have chest pain or shortness of breath. °· You have bleeding from your port that you cannot control. °Summary °· Take care of the port as told by your health care provider. °· Change your dressing as told by your health care provider. °·   Keep all follow-up visits as told by your health care provider. °This information is not intended to replace advice given to you by your health care provider. Make sure you discuss any questions you have with your health care provider. °Document Released: 07/25/2013 Document Revised: 08/25/2016 Document Reviewed: 08/25/2016 °Elsevier Interactive Patient Education © 2017 [employer] ° ° ° °Moderate Conscious Sedation, Adult, Care After °These instructions provide you with information about caring for yourself after your procedure. Your health care provider may also give you more specific instructions. Your  treatment has been planned according to current medical practices, but problems sometimes occur. Call your health care provider if you have any problems or questions after your procedure. °What can I expect after the procedure? °After your procedure, it is common: °· To feel sleepy for several hours. °· To feel clumsy and have poor balance for several hours. °· To have poor judgment for several hours. °· To vomit if you eat too soon. ° °Follow these instructions at home: °For at least 24 hours after the procedure: ° °· Do not: °? Participate in activities where you could fall or become injured. °? Drive. °? Use heavy machinery. °? Drink alcohol. °? Take sleeping pills or medicines that cause drowsiness. °? Make important decisions or sign legal documents. °? Take care of children on your own. °· Rest. °Eating and drinking °· Follow the diet recommended by your health care provider. °· If you vomit: °? Drink water, juice, or soup when you can drink without vomiting. °? Make sure you have little or no nausea before eating solid foods. °General instructions °· Have a responsible adult stay with you until you are awake and alert. °· Take over-the-counter and prescription medicines only as told by your health care provider. °· If you smoke, do not smoke without supervision. °· Keep all follow-up visits as told by your health care provider. This is important. °Contact a health care provider if: °· You keep feeling nauseous or you keep vomiting. °· You feel light-headed. °· You develop a rash. °· You have a fever. °Get help right away if: °· You have trouble breathing. °This information is not intended to replace advice given to you by your health care provider. Make sure you discuss any questions you have with your health care provider. °Document Released: 07/25/2013 Document Revised: 03/08/2016 Document Reviewed: 01/24/2016 °Elsevier Interactive Patient Education © 2018 [employer] ° °

## 2017-12-29 NOTE — Consult Note (Signed)
Chief Complaint: Patient was seen in consultation today for port a cath placement  Referring Physician(s): Gorsuch,Ni  Supervising Physician: Sandi Mariscal  Patient Status: [facility] - Out-pt  History of Present Illness: Connie Simmons is a 72 y.o. 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  Past Medical History:  Diagnosis Date  . Anemia   . Dental bridge present    front upper tooth  . endometrial ca dx'd 10/2017  . Fibroid   . Hypercholesteremia   . Hypertension    followed by pcp  . Nocturia   . PONV (postoperative nausea and vomiting)     Past Surgical History:  Procedure Laterality Date  . CHOLECYSTECTOMY OPEN  1974  . DILATION AND CURETTAGE OF UTERUS  1980s  . ROBOTIC ASSISTED TOTAL HYSTERECTOMY WITH BILATERAL SALPINGO OOPHERECTOMY Bilateral 12/01/2017   Procedure: XI ROBOTIC ASSISTED TOTAL HYSTERECTOMY WITH BILATERAL SALPINGO OOPHORECTOMY WITH SENTINAL LYMPH NODES;  Surgeon: Everitt Amber, MD;  Location: [facility] ORS;  Service: Gynecology;  Laterality: Bilateral;  . STRABISMUS SURGERY  age 67   unilateral    Allergies: Fish oil; Simvastatin; Codeine; and Sulfa antibiotics  Medications: Prior to Admission medications   Medication Sig Start Date End Date Taking? Authorizing Provider  benazepril-hydrochlorthiazide (LOTENSIN HCT) 20-12.5 MG tablet Take 2 tablets by mouth every morning.  01/06/16  Yes [provider]  ibuprofen (ADVIL,MOTRIN) 200 MG tablet Take 400-600 mg by mouth 2 (two) times daily as needed for moderate pain.    Yes [provider]  Multiple Vitamin (MULTIVITAMIN WITH MINERALS) TABS tablet Take 1 tablet by mouth daily.   Yes [provider]  acetaminophen (TYLENOL) 500 MG tablet Take 1,000 mg by mouth 2 (two) times daily as needed for moderate pain or headache.    [provider]  calcium carbonate (TUMS - DOSED IN MG ELEMENTAL CALCIUM) 500 MG chewable tablet Chew 2 tablets by mouth daily.    [provider]  dexamethasone (DECADRON) 4 MG tablet Take 5 tabs the night before and 5 tabs the morning of chemotherapy, every 3 weeks, with food 12/27/17   Heath Lark, MD  fluticasone (FLONASE) 50 MCG/ACT nasal spray Place 1 spray into both nostrils daily as needed for allergies or rhinitis.    [provider]  lidocaine-prilocaine (EMLA) cream Apply to affected area once 12/27/17   Heath Lark, MD  ondansetron (ZOFRAN) 8 MG tablet Take 1 tablet (8 mg total) by mouth every 8 (eight) hours as needed for refractory nausea / vomiting. Start on day 3 after chemo. 12/27/17   Heath Lark, MD  prochlorperazine (COMPAZINE) 10 MG tablet Take 1 tablet (10 mg total) by mouth every 6 (six) hours as needed (Nausea or vomiting). 12/27/17   Heath Lark, MD     Family History  Problem Relation Age of Onset  . Colon cancer Other   . Cancer Father        prostate ca    Social History   Socioeconomic History  . Marital status: Divorced    Spouse name: None  . Number of children: None  . Years of education: None  . Highest education level: None  Social Needs  . Financial resource strain: None  . Food insecurity - worry: None  . Food insecurity - inability: None  . Transportation needs - medical: None  . Transportation needs - non-medical: None  Occupational History  . Occupation: Haematologist  Tobacco Use  . Smoking status: Never Smoker  . Smokeless tobacco: Never Used  Substance and Sexual Activity  . Alcohol use: No  . Drug use: No  . Sexual activity: None  Other Topics Concern  . None  Social History Narrative  . None      Review of Systems currently denies fever, headache, chest pain, dyspnea, cough, abdominal/back pain, nausea, vomiting or bleeding  Vital Signs: BP 131/78 (BP Location: Right Arm)   Pulse 72   Temp 98.1 F (36.7 C) (Oral)    Resp 16   SpO2 100%   Physical Exam awake, alert.  Chest clear to auscultation bilaterally.  Heart with regular rate and rhythm.  Abdomen obese, soft, positive bowel sounds, nontender.  Extremities with full range of motion  Imaging: Ct Chest W Contrast  Result Date: 12/21/2017 CLINICAL DATA:  Newly diagnosed high-grade endometrial carcinoma. 3 weeks postop from TAH-BSO. Staging. EXAM: CT CHEST, ABDOMEN, AND PELVIS WITH CONTRAST TECHNIQUE: Multidetector CT imaging of the chest, abdomen and pelvis was performed following the standard protocol during bolus administration of intravenous contrast. CONTRAST:  133mL ISOVUE-300 IOPAMIDOL (ISOVUE-300) INJECTION 61% COMPARISON:  AP CT on 11/23/2017 FINDINGS: CT CHEST FINDINGS Cardiovascular: No acute findings. Mediastinum/Lymph Nodes: No pathologically enlarged lymph nodes identified. Dominant nodule seen in the inferior left thyroid lobe measuring 2.4 cm. Lungs/Pleura: No pulmonary infiltrate or pleural effusion. A few tiny scattered bilateral upper lobe pulmonary nodules are seen measuring less than 5 mm, which are indeterminate. Musculoskeletal:  No suspicious bone lesions identified. CT ABDOMEN AND PELVIS FINDINGS Hepatobiliary: No masses identified. Prior cholecystectomy. No evidence of biliary obstruction. Pancreas:  No mass or inflammatory changes. Spleen:  Within normal limits in size and appearance. Adrenals/Urinary tract: No masses or hydronephrosis. Unremarkable unopacified urinary bladder. Stomach/Bowel: No evidence of obstruction, inflammatory process, or abnormal fluid collections. Diverticulosis is seen mainly involving the sigmoid colon, however there is no evidence of diverticulitis. Vascular/Lymphatic: No pathologically enlarged lymph nodes identified. No abdominal aortic aneurysm. Reproductive: Prior hysterectomy noted. Adnexal regions are unremarkable in appearance. No pelvic mass or abnormal fluid collections identified. Other:  None.  Musculoskeletal:  No suspicious bone lesions identified. IMPRESSION: Prior hysterectomy. No evidence of residual or metastatic carcinoma within the abdomen or pelvis. Tiny indeterminate bilateral upper lobe pulmonary nodules, likely postinflammatory in etiology although metastatic disease cannot definitely be excluded. Recommend continued follow-up by chest CT in 6 months. Dominant 2.4 cm left thyroid lobe nodule. Consider thyroid ultrasound for further evaluation. This follows ACR consensus guidelines: Managing Incidental Thyroid Nodules Detected on Imaging: White Paper of the ACR Incidental Thyroid Findings Committee. J Am Coll Radiol 2015; 12:143-150. Electronically Signed   By: Earle Gell M.D.   On: 12/21/2017 15:55   Ct Abdomen Pelvis W Contrast  Result Date: 12/21/2017 CLINICAL DATA:  Newly diagnosed high-grade endometrial carcinoma. 3 weeks postop from TAH-BSO. Staging. EXAM: CT CHEST, ABDOMEN, AND PELVIS WITH CONTRAST TECHNIQUE: Multidetector CT imaging of the chest, abdomen and pelvis was performed following the standard protocol during bolus administration of intravenous contrast. CONTRAST:  168mL ISOVUE-300 IOPAMIDOL (ISOVUE-300) INJECTION 61% COMPARISON:  AP CT on 11/23/2017 FINDINGS: CT CHEST FINDINGS Cardiovascular: No acute findings. Mediastinum/Lymph Nodes: No pathologically enlarged lymph nodes identified. Dominant nodule seen in the inferior left thyroid lobe measuring 2.4 cm. Lungs/Pleura: No pulmonary infiltrate or pleural effusion. A few tiny scattered bilateral upper lobe pulmonary nodules are seen measuring less than 5 mm, which are indeterminate. Musculoskeletal:  No suspicious bone lesions identified. CT ABDOMEN AND PELVIS FINDINGS Hepatobiliary: No masses identified. Prior cholecystectomy. No evidence of biliary obstruction. Pancreas:  No mass or inflammatory changes. Spleen:  Within normal limits in size and appearance. Adrenals/Urinary tract: No masses or hydronephrosis. Unremarkable  unopacified urinary bladder. Stomach/Bowel: No evidence of obstruction, inflammatory process, or abnormal fluid collections. Diverticulosis is seen mainly involving the sigmoid colon, however there is no evidence of diverticulitis. Vascular/Lymphatic: No pathologically enlarged lymph nodes identified. No abdominal aortic aneurysm. Reproductive: Prior hysterectomy noted. Adnexal regions are unremarkable in appearance. No pelvic mass or abnormal fluid collections identified. Other:  None. Musculoskeletal:  No suspicious bone lesions identified. IMPRESSION: Prior hysterectomy. No evidence of residual or metastatic carcinoma within the abdomen or pelvis. Tiny indeterminate bilateral upper lobe pulmonary nodules, likely postinflammatory in etiology although metastatic disease cannot definitely be excluded. Recommend continued follow-up by chest CT in 6 months. Dominant 2.4 cm left thyroid lobe nodule. Consider thyroid ultrasound for further evaluation. This follows ACR consensus guidelines: Managing Incidental Thyroid Nodules Detected on Imaging: White Paper of the ACR Incidental Thyroid Findings Committee. J Am Coll Radiol 2015; 12:143-150. Electronically Signed   By: Earle Gell M.D.   On: 12/21/2017 15:55    Labs:  CBC: Recent Labs    11/25/17 0848 12/02/17 0436 12/27/17 0936 12/29/17 1049  WBC 7.3 8.8 5.9 6.6  HGB 10.4* 9.1*  --  11.3*  HCT 31.5* 26.9* 34.3* 35.4*  PLT 396 312 320 328    COAGS: Recent Labs    12/29/17 1049  INR 0.96    BMP: Recent Labs    11/17/17 1125 11/25/17 0848 12/02/17 0436 12/27/17 0936  NA 139 135 133* 137  K 4.6 3.9 4.5 3.8  CL 106 103 105 104  CO2 22 23 20* 25  GLUCOSE 86 88 134* 90  BUN 20 17 21* 15  CALCIUM 10.2 9.5 8.7* 9.8  CREATININE 0.93 1.00 0.89 0.99  GFRNONAA >60 56* >60 57*  GFRAA >60 >60 >60 >60    LIVER FUNCTION TESTS: Recent Labs    11/25/17 0848 12/27/17 0936  BILITOT 0.2* 0.4  AST 21 15  ALT 21 15  ALKPHOS 74 83  PROT 7.5  7.3  ALBUMIN 4.0 4.0    TUMOR MARKERS: No results for input(s): AFPTM, CEA, CA199, CHROMGRNA in the last 8760 hours.  Assessment and Plan: 72 y.o. 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  All of the patient's questions were answered, patient is agreeable to proceed. Consent signed and in chart.      Thank you for this interesting consult.  I greatly enjoyed meeting ALAINNA STAWICKI and look forward to participating in their care.  A copy of this report was sent to the requesting provider on this date.  Electronically Signed: D. Rowe Robert, PA-C 12/29/2017, 11:11 AM  Approximately 25  minutes were spent  face to face in clinical consultation, greater than 50% of which was counseling/coordinating care for Port-A-Cath placement

## 2017-12-29 NOTE — Sedation Documentation (Signed)
Patient is resting comfortably with eyes closed in NAD. MD suturing port pocket at this time 

## 2017-12-29 NOTE — Procedures (Signed)
Pre Procedure Dx: Endometrial Cancer Post Procedural Dx: Same  Successful placement of right IJ approach port-a-cath with tip at the superior caval atrial junction. The catheter is ready for immediate use.  Estimated Blood Loss: Minimal  Complications: None immediate.  Jay Obryan Radu, MD Pager #: [phone]   

## 2017-12-30 ENCOUNTER — Telehealth: Payer: Self-pay

## 2017-12-30 ENCOUNTER — Inpatient Hospital Stay: Payer: No Typology Code available for payment source | Admitting: Medical

## 2017-12-30 VITALS — BP 146/45 | HR 53 | Temp 97.8°F | Resp 17 | Ht 64.0 in | Wt 225.3 lb

## 2017-12-30 DIAGNOSIS — C7A8 Other malignant neuroendocrine tumors: Secondary | ICD-10-CM

## 2017-12-30 NOTE — Telephone Encounter (Signed)
Can she come in and have Lucianne Lei take a look?

## 2017-12-30 NOTE — Telephone Encounter (Signed)
Per Dr Alvy Bimler, I notified Lucianne Lei PA and he can see pt now.  Called pt and she can come in now to see [place] PA regarding itching at her port site.  Sent scheduling a inbasket to put pt on his schedule.

## 2017-12-30 NOTE — Telephone Encounter (Signed)
Received VM from pt regarding port was placed yesterday and she is having itching around the bandage.  Returned pt's call, she reports there is no redness and no swelling.  She states she is very emotional because she didn't sleep well last night, says she is worried the area could get infected.  Reports she was instructed on what to watch for and care of the port area.  Says she was told not to shower until her first infusion which is Monday, couldn't get the area wet, and she wonders when she can shower because that is bothering her also.  Pt has not tried oral Benadryl and denies any tape allergy and has not removed the bandage, at 3:30pm it will be 24 hours. Emotional support given and let her know I would route this message to Dr Alvy Bimler for further instructions.

## 2017-12-30 NOTE — Progress Notes (Signed)
Pt. Had called because her new port site felt uncomfortable and itchy Pt arrived to [facility] . This RN spoke with pt. Pt states she had port put in yesterday and was told the procedure went very well. Pt states it is a little tender but itchy. Dressing has been on 24 hours, therefore dressing removed. Pt immediately felt better.  Site is clean and dry no bruising noted, just some mild swelling-nothing unusual. No erythema, no bleeding. Showed pt her incision sites in the mirror and explaining what the 2 incisions were and what they are and why.  She verbalized feeling reassured. She denies pain,fever, chills.  Instructed pt she could leave the dressing off and to not take the steri strips off, that the strips would come off on their own in the next few days/weeks.  Also told pt not to get it week for the next several days.     But next week she could take a shower but to just let the water run over the sites and not to scrub it.  Pt voiced understanding. She is due for her 1st chemo treatment on Monday. Advised her not to use the EMLA cream before her 1st treatment , that the nurse could apply a cold pack to numb it prior to inserting the needle. Pt voiced understanding.   RN visit only.

## 2018-01-02 ENCOUNTER — Inpatient Hospital Stay: Payer: No Typology Code available for payment source

## 2018-01-02 VITALS — BP 99/44 | HR 56 | Temp 98.5°F | Resp 16 | Wt 215.0 lb

## 2018-01-02 DIAGNOSIS — C541 Malignant neoplasm of endometrium: Secondary | ICD-10-CM

## 2018-01-02 DIAGNOSIS — Z5111 Encounter for antineoplastic chemotherapy: Secondary | ICD-10-CM | POA: Diagnosis not present

## 2018-01-02 MED ORDER — DIPHENHYDRAMINE HCL 50 MG/ML IJ SOLN
50.0000 mg | Freq: Once | INTRAMUSCULAR | Status: AC
  Administered 2018-01-02: 50 mg via INTRAVENOUS

## 2018-01-02 MED ORDER — FOSAPREPITANT DIMEGLUMINE INJECTION 150 MG
Freq: Once | INTRAVENOUS | Status: AC
  Administered 2018-01-02: 09:00:00 via INTRAVENOUS
  Filled 2018-01-02: qty 5

## 2018-01-02 MED ORDER — PALONOSETRON HCL INJECTION 0.25 MG/5ML
INTRAVENOUS | Status: AC
  Filled 2018-01-02: qty 5

## 2018-01-02 MED ORDER — PALONOSETRON HCL INJECTION 0.25 MG/5ML
0.2500 mg | Freq: Once | INTRAVENOUS | Status: AC
  Administered 2018-01-02: 0.25 mg via INTRAVENOUS

## 2018-01-02 MED ORDER — SODIUM CHLORIDE 0.9% FLUSH
10.0000 mL | INTRAVENOUS | Status: DC | PRN
  Administered 2018-01-02: 10 mL
  Filled 2018-01-02: qty 10

## 2018-01-02 MED ORDER — HEPARIN SOD (PORK) LOCK FLUSH 100 UNIT/ML IV SOLN
500.0000 [IU] | Freq: Once | INTRAVENOUS | Status: AC | PRN
  Administered 2018-01-02: 500 [IU]
  Filled 2018-01-02: qty 5

## 2018-01-02 MED ORDER — SODIUM CHLORIDE 0.9 % IV SOLN
660.0000 mg | Freq: Once | INTRAVENOUS | Status: AC
  Administered 2018-01-02: 660 mg via INTRAVENOUS
  Filled 2018-01-02: qty 66

## 2018-01-02 MED ORDER — PEGFILGRASTIM 6 MG/0.6ML ~~LOC~~ PSKT
PREFILLED_SYRINGE | SUBCUTANEOUS | Status: AC
  Filled 2018-01-02: qty 0.6

## 2018-01-02 MED ORDER — SODIUM CHLORIDE 0.9 % IV SOLN
Freq: Once | INTRAVENOUS | Status: AC
  Administered 2018-01-02: 08:00:00 via INTRAVENOUS

## 2018-01-02 MED ORDER — SODIUM CHLORIDE 0.9 % IV SOLN
175.0000 mg/m2 | Freq: Once | INTRAVENOUS | Status: AC
  Administered 2018-01-02: 372 mg via INTRAVENOUS
  Filled 2018-01-02: qty 62

## 2018-01-02 MED ORDER — SODIUM CHLORIDE 0.9 % IV SOLN
20.0000 mg | Freq: Once | INTRAVENOUS | Status: AC
  Administered 2018-01-02: 20 mg via INTRAVENOUS
  Filled 2018-01-02: qty 2

## 2018-01-02 MED ORDER — DIPHENHYDRAMINE HCL 50 MG/ML IJ SOLN
INTRAMUSCULAR | Status: AC
  Filled 2018-01-02: qty 1

## 2018-01-02 MED ORDER — PEGFILGRASTIM 6 MG/0.6ML ~~LOC~~ PSKT
6.0000 mg | PREFILLED_SYRINGE | Freq: Once | SUBCUTANEOUS | Status: AC
  Administered 2018-01-02: 6 mg via SUBCUTANEOUS

## 2018-01-02 MED ORDER — FAMOTIDINE IN NACL 20-0.9 MG/50ML-% IV SOLN
20.0000 mg | Freq: Once | INTRAVENOUS | Status: DC
Start: 2018-01-02 — End: 2018-01-02

## 2018-01-02 NOTE — Patient Instructions (Signed)
[facility] Discharge Instructions for Patients Receiving Chemotherapy  Today you received the following chemotherapy agents Paclitaxel and Cabroplatin  To help prevent nausea and vomiting after your treatment, we encourage you to take your nausea medication. If you develop nausea and vomiting that is not controlled by your nausea medication, call the clinic.   BELOW ARE SYMPTOMS THAT SHOULD BE REPORTED IMMEDIATELY:  *FEVER GREATER THAN 100.5 F  *CHILLS WITH OR WITHOUT FEVER  NAUSEA AND VOMITING THAT IS NOT CONTROLLED WITH YOUR NAUSEA MEDICATION  *UNUSUAL SHORTNESS OF BREATH  *UNUSUAL BRUISING OR BLEEDING  TENDERNESS IN MOUTH AND THROAT WITH OR WITHOUT PRESENCE OF ULCERS  *URINARY PROBLEMS  *BOWEL PROBLEMS  UNUSUAL RASH Items with * indicate a potential emergency and should be followed up as soon as possible.  Feel free to call the clinic should you have any questions or concerns. The clinic phone number is [phone].  Please show the [facility] at check-in to the Emergency Department and triage nurse.  Paclitaxel injection What is this medicine? PACLITAXEL (PAK li TAX el) is a chemotherapy drug. It targets fast dividing cells, like cancer cells, and causes these cells to die. This medicine is used to treat ovarian cancer, breast cancer, and other cancers. This medicine may be used for other purposes; ask your health care provider or pharmacist if you have questions. COMMON BRAND NAME(S): Onxol, Taxol What should I tell my health care provider before I take this medicine? They need to know if you have any of these conditions: -blood disorders -irregular heartbeat -infection (especially a virus infection such as chickenpox, cold sores, or herpes) -liver disease -previous or ongoing radiation therapy -an unusual or allergic reaction to paclitaxel, alcohol, polyoxyethylated castor oil, other chemotherapy agents, other medicines, foods, dyes, or  preservatives -pregnant or trying to get pregnant -breast-feeding How should I use this medicine? This drug is given as an infusion into a vein. It is administered in a hospital or clinic by a specially trained health care professional. Talk to your pediatrician regarding the use of this medicine in children. Special care may be needed. Overdosage: If you think you have taken too much of this medicine contact a poison control center or emergency room at once. NOTE: This medicine is only for you. Do not share this medicine with others. What if I miss a dose? It is important not to miss your dose. Call your doctor or health care professional if you are unable to keep an appointment. What may interact with this medicine? Do not take this medicine with any of the following medications: -disulfiram -metronidazole This medicine may also interact with the following medications: -cyclosporine -diazepam -ketoconazole -medicines to increase blood counts like filgrastim, pegfilgrastim, sargramostim -other chemotherapy drugs like cisplatin, doxorubicin, epirubicin, etoposide, teniposide, vincristine -quinidine -testosterone -vaccines -verapamil Talk to your doctor or health care professional before taking any of these medicines: -acetaminophen -aspirin -ibuprofen -ketoprofen -naproxen This list may not describe all possible interactions. Give your health care provider a list of all the medicines, herbs, non-prescription drugs, or dietary supplements you use. Also tell them if you smoke, drink alcohol, or use illegal drugs. Some items may interact with your medicine. What should I watch for while using this medicine? Your condition will be monitored carefully while you are receiving this medicine. You will need important blood work done while you are taking this medicine. This medicine can cause serious allergic reactions. To reduce your risk you will need to take other medicine(s)  before  treatment with this medicine. If you experience allergic reactions like skin rash, itching or hives, swelling of the face, lips, or tongue, tell your doctor or health care professional right away. In some cases, you may be given additional medicines to help with side effects. Follow all directions for their use. This drug may make you feel generally unwell. This is not uncommon, as chemotherapy can affect healthy cells as well as cancer cells. Report any side effects. Continue your course of treatment even though you feel ill unless your doctor tells you to stop. Call your doctor or health care professional for advice if you get a fever, chills or sore throat, or other symptoms of a cold or flu. Do not treat yourself. This drug decreases your body's ability to fight infections. Try to avoid being around people who are sick. This medicine may increase your risk to bruise or bleed. Call your doctor or health care professional if you notice any unusual bleeding. Be careful brushing and flossing your teeth or using a toothpick because you may get an infection or bleed more easily. If you have any dental work done, tell your dentist you are receiving this medicine. Avoid taking products that contain aspirin, acetaminophen, ibuprofen, naproxen, or ketoprofen unless instructed by your doctor. These medicines may hide a fever. Do not become pregnant while taking this medicine. Women should inform their doctor if they wish to become pregnant or think they might be pregnant. There is a potential for serious side effects to an unborn child. Talk to your health care professional or pharmacist for more information. Do not breast-feed an infant while taking this medicine. Men are advised not to father a child while receiving this medicine. This product may contain alcohol. Ask your pharmacist or healthcare provider if this medicine contains alcohol. Be sure to tell all healthcare providers you are taking this medicine.  Certain medicines, like metronidazole and disulfiram, can cause an unpleasant reaction when taken with alcohol. The reaction includes flushing, headache, nausea, vomiting, sweating, and increased thirst. The reaction can last from 30 minutes to several hours. What side effects may I notice from receiving this medicine? Side effects that you should report to your doctor or health care professional as soon as possible: -allergic reactions like skin rash, itching or hives, swelling of the face, lips, or tongue -low blood counts - This drug may decrease the number of white blood cells, red blood cells and platelets. You may be at increased risk for infections and bleeding. -signs of infection - fever or chills, cough, sore throat, pain or difficulty passing urine -signs of decreased platelets or bleeding - bruising, pinpoint red spots on the skin, black, tarry stools, nosebleeds -signs of decreased red blood cells - unusually weak or tired, fainting spells, lightheadedness -breathing problems -chest pain -high or low blood pressure -mouth sores -nausea and vomiting -pain, swelling, redness or irritation at the injection site -pain, tingling, numbness in the hands or feet -slow or irregular heartbeat -swelling of the ankle, feet, hands Side effects that usually do not require medical attention (report to your doctor or health care professional if they continue or are bothersome): -bone pain -complete hair loss including hair on your head, underarms, pubic hair, eyebrows, and eyelashes -changes in the color of fingernails -diarrhea -loosening of the fingernails -loss of appetite -muscle or joint pain -red flush to skin -sweating This list may not describe all possible side effects. Call your doctor for medical advice about side effects. You   may report side effects to FDA at 1-800-FDA-1088. Where should I keep my medicine? This drug is given in a hospital or clinic and will not be stored at  home. NOTE: This sheet is a summary. It may not cover all possible information. If you have questions about this medicine, talk to your doctor, pharmacist, or health care provider.  2018 Elsevier/Gold Standard (2015-08-05 19:58:00)  Carboplatin injection What is this medicine? CARBOPLATIN (KAR boe pla tin) is a chemotherapy drug. It targets fast dividing cells, like cancer cells, and causes these cells to die. This medicine is used to treat ovarian cancer and many other cancers. This medicine may be used for other purposes; ask your health care provider or pharmacist if you have questions. COMMON BRAND NAME(S): Paraplatin What should I tell my health care provider before I take this medicine? They need to know if you have any of these conditions: -blood disorders -hearing problems -kidney disease -recent or ongoing radiation therapy -an unusual or allergic reaction to carboplatin, cisplatin, other chemotherapy, other medicines, foods, dyes, or preservatives -pregnant or trying to get pregnant -breast-feeding How should I use this medicine? This drug is usually given as an infusion into a vein. It is administered in a hospital or clinic by a specially trained health care professional. Talk to your pediatrician regarding the use of this medicine in children. Special care may be needed. Overdosage: If you think you have taken too much of this medicine contact a poison control center or emergency room at once. NOTE: This medicine is only for you. Do not share this medicine with others. What if I miss a dose? It is important not to miss a dose. Call your doctor or health care professional if you are unable to keep an appointment. What may interact with this medicine? -medicines for seizures -medicines to increase blood counts like filgrastim, pegfilgrastim, sargramostim -some antibiotics like amikacin, gentamicin, neomycin, streptomycin, tobramycin -vaccines Talk to your doctor or health  care professional before taking any of these medicines: -acetaminophen -aspirin -ibuprofen -ketoprofen -naproxen This list may not describe all possible interactions. Give your health care provider a list of all the medicines, herbs, non-prescription drugs, or dietary supplements you use. Also tell them if you smoke, drink alcohol, or use illegal drugs. Some items may interact with your medicine. What should I watch for while using this medicine? Your condition will be monitored carefully while you are receiving this medicine. You will need important blood work done while you are taking this medicine. This drug may make you feel generally unwell. This is not uncommon, as chemotherapy can affect healthy cells as well as cancer cells. Report any side effects. Continue your course of treatment even though you feel ill unless your doctor tells you to stop. In some cases, you may be given additional medicines to help with side effects. Follow all directions for their use. Call your doctor or health care professional for advice if you get a fever, chills or sore throat, or other symptoms of a cold or flu. Do not treat yourself. This drug decreases your body's ability to fight infections. Try to avoid being around people who are sick. This medicine may increase your risk to bruise or bleed. Call your doctor or health care professional if you notice any unusual bleeding. Be careful brushing and flossing your teeth or using a toothpick because you may get an infection or bleed more easily. If you have any dental work done, tell your dentist you are receiving this  medicine. Avoid taking products that contain aspirin, acetaminophen, ibuprofen, naproxen, or ketoprofen unless instructed by your doctor. These medicines may hide a fever. Do not become pregnant while taking this medicine. Women should inform their doctor if they wish to become pregnant or think they might be pregnant. There is a potential for serious  side effects to an unborn child. Talk to your health care professional or pharmacist for more information. Do not breast-feed an infant while taking this medicine. What side effects may I notice from receiving this medicine? Side effects that you should report to your doctor or health care professional as soon as possible: -allergic reactions like skin rash, itching or hives, swelling of the face, lips, or tongue -signs of infection - fever or chills, cough, sore throat, pain or difficulty passing urine -signs of decreased platelets or bleeding - bruising, pinpoint red spots on the skin, black, tarry stools, nosebleeds -signs of decreased red blood cells - unusually weak or tired, fainting spells, lightheadedness -breathing problems -changes in hearing -changes in vision -chest pain -high blood pressure -low blood counts - This drug may decrease the number of white blood cells, red blood cells and platelets. You may be at increased risk for infections and bleeding. -nausea and vomiting -pain, swelling, redness or irritation at the injection site -pain, tingling, numbness in the hands or feet -problems with balance, talking, walking -trouble passing urine or change in the amount of urine Side effects that usually do not require medical attention (report to your doctor or health care professional if they continue or are bothersome): -hair loss -loss of appetite -metallic taste in the mouth or changes in taste This list may not describe all possible side effects. Call your doctor for medical advice about side effects. You may report side effects to FDA at 1-800-FDA-1088. Where should I keep my medicine? This drug is given in a hospital or clinic and will not be stored at home. NOTE: This sheet is a summary. It may not cover all possible information. If you have questions about this medicine, talk to your doctor, pharmacist, or health care provider.  2018 Elsevier/Gold Standard (2008-01-09  14:38:05)

## 2018-01-03 ENCOUNTER — Telehealth: Payer: Self-pay | Admitting: Hematology and Oncology

## 2018-01-03 ENCOUNTER — Telehealth: Payer: Self-pay

## 2018-01-03 NOTE — Telephone Encounter (Signed)
Yes, no problem!

## 2018-01-03 NOTE — Telephone Encounter (Signed)
10:52 am spoke with patient to confirm Disability paperwork is ready for pick-up and will be at front desk reception area.

## 2018-01-03 NOTE — Telephone Encounter (Signed)
Called with below message, verbalized understanding. 

## 2018-01-03 NOTE — Telephone Encounter (Signed)
She called and left a message to call her, she has questions regarding first treatment yesterday  Called back. She got the On[facility] yesterday with her treatment. She needs to cancel injection appt for 3/20, appt canceled. She is doing well after her first treatment.   She wants to know if it is okay to take natural herbal tea for the digestive system 2 x a day, it helps her stay regular. She asked in chemo education but they never answered her question.

## 2018-01-04 ENCOUNTER — Ambulatory Visit: Payer: PRIVATE HEALTH INSURANCE

## 2018-01-04 ENCOUNTER — Telehealth: Payer: Self-pay | Admitting: *Deleted

## 2018-01-04 NOTE — Telephone Encounter (Signed)
Notified that pharmacy has advised against the [place] tea due to drug interactions. Verbalized understanding

## 2018-01-05 ENCOUNTER — Telehealth: Payer: Self-pay

## 2018-01-05 DIAGNOSIS — C801 Malignant (primary) neoplasm, unspecified: Secondary | ICD-10-CM

## 2018-01-05 DIAGNOSIS — C7A8 Other malignant neuroendocrine tumors: Secondary | ICD-10-CM

## 2018-01-05 DIAGNOSIS — C541 Malignant neoplasm of endometrium: Secondary | ICD-10-CM

## 2018-01-05 DIAGNOSIS — C786 Secondary malignant neoplasm of retroperitoneum and peritoneum: Secondary | ICD-10-CM

## 2018-01-05 MED ORDER — TRAMADOL HCL 50 MG PO TABS: 50.0000 mg | ORAL_TABLET | Freq: Four times a day (QID) | ORAL | 0 refills | Status: DC | PRN

## 2018-01-05 NOTE — Telephone Encounter (Signed)
Per Dr Alvy Bimler, to return call to pt with the following instructions:  1) She can stop laxatives if loose stool 2) The belching and burning is likely due to dexamethasone. Recommend Mylanta and tums. Also instruct her to reduce premed dex to 3 tabs the night before and 3 tabs the morning of chemo 3) The deep aches/spasm, etc is likely due to taxol. It will improve in a few days. Recommend tramadol as needed or if she has leftover pain medicine from surgery, she can take that too. Tramadol 50 mg q 6 hours prn, please call in to dispense 30 tabs, no refills  Outgoing call to patient with these instructions and pt voiced understanding.  No other needs at this time per pt. Called in Tramadol as ordered to her pharmacy [facility] in [place], spoke with [place] there.

## 2018-01-05 NOTE — Telephone Encounter (Signed)
Received VM from pt regarding she is having diarrhea, burning in her esophogus and abd.  Called pt back and she reports:  "Diarrhea" yesterday twice and once this morning- she went on to describe as loose stools not watery and then said she had been constipated and took a "stool softner".  Got pt to read the bottle and it was actually a stool softner with "stimulant laxative".  Explained to pt the laxative added prob caused the loose stools.   Pt also reports her muscles hurt, having vaginal spasms, the bottoms of her feet burn, and her ankles hurt.    She also reports burning as her food or drink goes down and belching.  She says she that Tums helps some. Pt has not tried any other antiacids.    Told pt I would route my note to Dr Alvy Bimler and I will call her back with further instructions regarding her symptoms.  Pt voiced understanding.

## 2018-01-05 NOTE — Telephone Encounter (Signed)
1) She can stop laxatives if loose stool 2) The belching and burning is likely due to dexamethasone. Recommend Mylanta and tums. Also instruct her to reduce premed dex to 3 tabs the night before and 3 tabs the morning of chemo 3) The deep aches/spasm, etc is likely due to taxol. It will improve in a few days. Recommend tramadol as needed or if she has leftover pain medicine from surgery, she can take that too. Tramadol 50 mg q 6 hours prn, please call in to dispense 30 tabs, no refills

## 2018-01-06 NOTE — Progress Notes (Addendum)
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 Lymph node, sentinel, biopsy, right obturator - ONE BENIGN LYMPH NODE (0/1). 2. Peritoneum, biopsy, peritoneal nodule - NEUROENDOCRINE TUMOR. - SEE COMMENT. 3. Lymph node, sentinel, biopsy, left obturator - SEVEN BENIGN LYMPH NODES (0/7). 4. Uterus +/- tubes/ovaries, neoplastic ENDOMETRIUM. - HIGH GRADE SEROUS CARCINOMA, 3.7 CM. - CARCINOMA FOCALLY INVOLVES OUTER HALF OF MYOMETRIUM. - CERVIX, RIGHT OVARY AND BILATERAL FALLOPIAN TUBES FREE OF TUMOR. LEFT OVARY: - NEUROENDOCRINE TUMOR.  Past/Anticipated interventions by Gyn/Onc surgery, if any: 12/01/17 - Procedure: XI ROBOTIC ASSISTED TOTAL HYSTERECTOMY WITH BILATERAL SALPINGO OOPHORECTOMY WITH SENTINAL LYMPH NODES;  Surgeon: Everitt Amber, MD  Past/Anticipated interventions by medical oncology, if any: 6 cycles carboplatin and paclitaxel and vaginal brachytherapy.  Chemo was started on 01/02/18.   Weight changes, if any: yes - has lost about 8 lbs since surgery.  Bowel/Bladder complaints, if any: No.  Nausea/Vomiting, if any: vomited 1 time on Friday.  Pain issues, if any:  Has burning in both feet since chemotheapy.  She is taking tramadol which helps.  SAFETY ISSUES:  Prior radiation? no  Pacemaker/ICD? no  Possible current pregnancy? no  Is the patient on methotrexate? no  Current Complaints / other details:  Per Dr. Serita Grit note - patient will have colonoscopy and EGD to evaluate for GI primary after chemotherapy.  She is here with her son and best friend.  BP 109/69 (BP Location: Right Wrist, Patient Position: Sitting)   Pulse 93   Temp 98.3 F (36.8 C) (Oral)   Ht 5\' 4"  (1.626 m)   Wt 217 lb 6.4 oz (98.6 kg)   SpO2 100%   BMI 37.32 kg/m    Wt Readings  from Last 3 Encounters:  01/09/18 217 lb 6.4 oz (98.6 kg)  01/02/18 215 lb (97.5 kg)  12/30/17 225 lb 4.8 oz (102.2 kg)

## 2018-01-09 ENCOUNTER — Ambulatory Visit
Admission: RE | Admit: 2018-01-09 | Discharge: 2018-01-09 | Disposition: A | Discharge status: Home / Self Care | Payer: PRIVATE HEALTH INSURANCE | Source: Ambulatory Visit | Attending: Radiation Oncology | Admitting: Radiation Oncology

## 2018-01-09 ENCOUNTER — Other Ambulatory Visit: Payer: Self-pay

## 2018-01-09 ENCOUNTER — Encounter: Payer: Self-pay | Admitting: Radiation Oncology

## 2018-01-09 DIAGNOSIS — C786 Secondary malignant neoplasm of retroperitoneum and peritoneum: Secondary | ICD-10-CM | POA: Diagnosis not present

## 2018-01-09 DIAGNOSIS — Z882 Allergy status to sulfonamides status: Secondary | ICD-10-CM | POA: Diagnosis not present

## 2018-01-09 DIAGNOSIS — Z90710 Acquired absence of both cervix and uterus: Secondary | ICD-10-CM | POA: Insufficient documentation

## 2018-01-09 DIAGNOSIS — E7800 Pure hypercholesterolemia, unspecified: Secondary | ICD-10-CM | POA: Insufficient documentation

## 2018-01-09 DIAGNOSIS — C541 Malignant neoplasm of endometrium: Secondary | ICD-10-CM | POA: Insufficient documentation

## 2018-01-09 DIAGNOSIS — Z7401 Bed confinement status: Secondary | ICD-10-CM | POA: Insufficient documentation

## 2018-01-09 DIAGNOSIS — Z79899 Other long term (current) drug therapy: Secondary | ICD-10-CM | POA: Insufficient documentation

## 2018-01-09 DIAGNOSIS — Z8542 Personal history of malignant neoplasm of other parts of uterus: Secondary | ICD-10-CM | POA: Insufficient documentation

## 2018-01-09 DIAGNOSIS — I10 Essential (primary) hypertension: Secondary | ICD-10-CM | POA: Diagnosis not present

## 2018-01-09 DIAGNOSIS — Z885 Allergy status to narcotic agent status: Secondary | ICD-10-CM | POA: Insufficient documentation

## 2018-01-09 DIAGNOSIS — D3A8 Other benign neuroendocrine tumors: Secondary | ICD-10-CM | POA: Diagnosis not present

## 2018-01-09 NOTE — Progress Notes (Signed)
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 female who is seen for newly diagnosed high grade endometrial cancer. The patient had approximately 6 months of light vaginal bleeding. In January 2019, the patient reported the bleeding became heavier. She presented to the [facility] on 11/04/17. She was treated with Megace. She presented to Dr. Nelda Marseille on 11/10/17. TVUS showed her uterus measured 6.3 x 11.2 x 7 cm and the endometrium was 4 cm thick. Endometrial biopsy at that time revealed high grade serous endometrial cancer. Preoperative CT imaging of the abdomen and pelvis on 11/23/17 revealed a grossly thickened and heterogeneous endometrium consistent with known endometrial carcinoma. There was no CT finding to suggest serosal or extra uterine direct extension. There were worrisome small peritoneal nodules and a small mesenteric nodal mass in the root of the small bowel mesentery that is suspicious for metastatic disease.    The patient then underwent robot assisted total hysterectomy, BSO, SLN biopsy and debulking of the larger peritoneal implants on 12/01/17 under the care of Dr. Denman George. Final pathology showed a 3.5 cm high grade serous endometrial cancer with outer half myometrial invasion, negative LVSI and negative lymph nodes. There was an incidentally found neuroendocrine tumor on the left ovary. It was characterized as a 1 cm nodule of neoplasm characterized by sheets of cells with uniform, round to oval nuclei, with rare mitotic figures. There was a similar 0.6 cm  nodule in the paraovarian connective tissue and the peritoneal nodule has identical features. The peritoneal modules that were resected were consistent with neuroendocrine tumor.   Follow-up CT of the chest, abdomen, and pelvis on 12/21/17 showed prior hysterectomy without evidence of residual or metastatic carcinoma within the abdomen or pelvis. Tiny indeterminate bilateral upper lobe pulmonary nodules, likely postinflammatory in etiology. Dominant 2.4 cm left thyroid nodule noted.   The patient was started on 6 cycles of carboplatin and paclitaxel on 01/02/18 under the care of Dr. Alvy Bimler. The patient presents to radiation oncology to discuss the role of vaginal brachytherapy as part of her disease management.   The patient complains of an 8 lb weight loss since surgery. She notes one episode of vomiting on Friday. She complains of burning in bilateral feet since the start of chemotherapy that she is managing with Tramadol. She denies bowel/bladder complaints.   PREVIOUS RADIATION THERAPY: No  PAST MEDICAL HISTORY:  has a past medical history of Anemia, Dental bridge present, endometrial ca (dx'd 10/2017), Fibroid, Hypercholesteremia, Hypertension, Nocturia, and PONV (postoperative nausea and vomiting).    PAST SURGICAL HISTORY: Past Surgical History:  Procedure Laterality Date  . CHOLECYSTECTOMY OPEN  1974  . DILATION AND CURETTAGE OF UTERUS  1980s  . IR FLUORO GUIDE PORT INSERTION RIGHT  12/29/2017  . IR US GUIDE VASC ACCESS RIGHT  12/29/2017  . ROBOTIC ASSISTED TOTAL HYSTERECTOMY WITH BILATERAL SALPINGO OOPHERECTOMY Bilateral 12/01/2017   Procedure: XI ROBOTIC ASSISTED TOTAL HYSTERECTOMY WITH BILATERAL SALPINGO OOPHORECTOMY WITH SENTINAL LYMPH NODES;  Surgeon: Everitt Amber, MD;  Location: [facility];  Service: Gynecology;  Laterality: Bilateral;  . STRABISMUS SURGERY  age 89  unilateral    FAMILY HISTORY: family history includes Cancer in her father; Colon cancer in her other.  SOCIAL HISTORY:   reports that she has never smoked. She has never used smokeless tobacco. She reports that she does not drink alcohol or use drugs.  ALLERGIES: Fish oil; Simvastatin; Codeine; and Sulfa antibiotics  MEDICATIONS:  Current Outpatient Medications  Medication Sig Dispense Refill  . acetaminophen (TYLENOL) 500 MG tablet Take 1,000 mg by mouth 2 (two) times daily as needed for moderate pain or headache.    Marland Kitchen alum & mag hydroxide-simeth (MAALOX/MYLANTA) 200-200-20 MG/5ML suspension Take by mouth every 6 (six) hours as needed for indigestion or heartburn.    . benazepril-hydrochlorthiazide (LOTENSIN HCT) 20-12.5 MG tablet Take 2 tablets by mouth every morning.   1  . calcium carbonate (TUMS - DOSED IN MG ELEMENTAL CALCIUM) 500 MG chewable tablet Chew 2 tablets by mouth daily.    Marland Kitchen dexamethasone (DECADRON) 4 MG tablet Take 5 tabs the night before and 5 tabs the morning of chemotherapy, every 3 weeks, with food 60 tablet 0  . lidocaine-prilocaine (EMLA) cream Apply to affected area once 30 g 3  . loratadine (CLARITIN) 10 MG tablet Take 10 mg by mouth daily.    . Multiple Vitamin (MULTIVITAMIN WITH MINERALS) TABS tablet Take 1 tablet by mouth daily.    . traMADol (ULTRAM) 50 MG tablet Take 1 tablet (50 mg total) by mouth every 6 (six) hours as needed. 30 tablet 0  . fluticasone (FLONASE) 50 MCG/ACT nasal spray Place 1 spray into both nostrils daily as needed for allergies or rhinitis.    Marland Kitchen ibuprofen (ADVIL,MOTRIN) 200 MG tablet Take 400-600 mg by mouth 2 (two) times daily as needed for moderate pain.     Marland Kitchen ondansetron (ZOFRAN) 8 MG tablet Take 1 tablet (8 mg total) by mouth every 8 (eight) hours as needed for refractory nausea / vomiting. Start on day 3 after chemo. (Patient not taking: Reported on 01/09/2018) 30 tablet 1  . prochlorperazine (COMPAZINE) 10 MG tablet Take 1 tablet (10 mg total) by mouth every 6 (six) hours as needed (Nausea or vomiting). (Patient not taking: Reported on 01/09/2018) 30 tablet 1     No current facility-administered medications for this encounter.     REVIEW OF SYSTEMS: A 10+ POINT REVIEW OF SYSTEMS WAS OBTAINED including neurology, dermatology, psychiatry, cardiac, respiratory, lymph, extremities, GI, GU, musculoskeletal, constitutional, reproductive, HEENT. All pertinent positives are noted in the HPI. All others are negative.    PHYSICAL EXAM:  height is _0  (1.626 m) and weight is 217 lb 6.4 oz (98.6 kg). Her oral temperature is 98.3 F (36.8 C). Her blood pressure is 109/69 and her pulse is 93. Her oxygen saturation is 100%.   General: Alert and oriented, in no acute distress HEENT: Head is normocephalic. Extraocular movements are intact. Oropharynx is clear. Neck: Neck is supple, no palpable cervical or supraclavicular lymphadenopathy. Heart: Regular in rate and rhythm with no murmurs, rubs, or gallops. Chest: Clear to auscultation bilaterally, with no rhonchi, wheezes, or rales. Porta-cath present in the right upper chest. Abdomen: Soft, nontender, nondistended, with no rigidity or guarding. The patient has 5 small scars from her laparoscopic procedure. These have all healed well without signs of drainage or infection. She also has a scar in the right upper quadrant from prior cholecystectomy. Extremities: No cyanosis or edema. Lymphatics: see Neck Exam Skin: No concerning lesions. Musculoskeletal: symmetric strength and muscle tone throughout. Neurologic: Cranial nerves II through  XII are grossly intact. No obvious focalities. Speech is fluent. Coordination is intact. Psychiatric: Judgment and insight are intact. Affect is appropriate.  Pelvic exam deferred until simulation and planning day  ECOG = 1  0 - Asymptomatic (Fully active, able to carry on all predisease activities without restriction)  1 - Symptomatic but completely ambulatory (Restricted in physically strenuous activity but ambulatory and able to carry out work of a light or sedentary nature.  For example, light housework, office work)  2 - Symptomatic, <50% in bed during the day (Ambulatory and capable of all self care but unable to carry out any work activities. Up and about more than 50% of waking hours)  3 - Symptomatic, >50% in bed, but not bedbound (Capable of only limited self-care, confined to bed or chair 50% or more of waking hours)  4 - Bedbound (Completely disabled. Cannot carry on any self-care. Totally confined to bed or chair)  5 - Death   Eustace Pen MM, Creech RH, Tormey DC, et al. [phone]). "Toxicity and response criteria of the [facility]". [place] Oncol. 5 (6): 649-55  LABORATORY DATA:  Lab Results  Component Value Date   WBC 6.6 12/29/2017   HGB 11.3 (L) 12/29/2017   HCT 35.4 (L) 12/29/2017   MCV 84.7 12/29/2017   PLT 328 12/29/2017   NEUTROABS 4.1 12/29/2017   Lab Results  Component Value Date   NA 141 12/29/2017   K 4.2 12/29/2017   CL 108 12/29/2017   CO2 22 12/29/2017   GLUCOSE 89 12/29/2017   CREATININE 0.98 12/29/2017   CALCIUM 9.4 12/29/2017      RADIOGRAPHY: Ct Chest W Contrast  Result Date: 12/21/2017 CLINICAL DATA:  Newly diagnosed high-grade endometrial carcinoma. 3 weeks postop from TAH-BSO. Staging. EXAM: CT CHEST, ABDOMEN, AND PELVIS WITH CONTRAST TECHNIQUE: Multidetector CT imaging of the chest, abdomen and pelvis was performed following the standard protocol during bolus administration of intravenous contrast. CONTRAST:  169m ISOVUE-300 IOPAMIDOL (ISOVUE-300) INJECTION 61% COMPARISON:  AP CT on 11/23/2017 FINDINGS: CT CHEST FINDINGS Cardiovascular: No acute findings. Mediastinum/Lymph Nodes: No pathologically enlarged lymph nodes identified. Dominant nodule seen in the inferior left thyroid lobe measuring 2.4 cm. Lungs/Pleura: No pulmonary infiltrate or pleural effusion. A few tiny scattered bilateral upper lobe pulmonary nodules are seen measuring less than 5 mm, which are indeterminate. Musculoskeletal:  No  suspicious bone lesions identified. CT ABDOMEN AND PELVIS FINDINGS Hepatobiliary: No masses identified. Prior cholecystectomy. No evidence of biliary obstruction. Pancreas:  No mass or inflammatory changes. Spleen:  Within normal limits in size and appearance. Adrenals/Urinary tract: No masses or hydronephrosis. Unremarkable unopacified urinary bladder. Stomach/Bowel: No evidence of obstruction, inflammatory process, or abnormal fluid collections. Diverticulosis is seen mainly involving the sigmoid colon, however there is no evidence of diverticulitis. Vascular/Lymphatic: No pathologically enlarged lymph nodes identified. No abdominal aortic aneurysm. Reproductive: Prior hysterectomy noted. Adnexal regions are unremarkable in appearance. No pelvic mass or abnormal fluid collections identified. Other:  None. Musculoskeletal:  No suspicious bone lesions identified. IMPRESSION: Prior hysterectomy. No evidence of residual or metastatic carcinoma within the abdomen or pelvis. Tiny indeterminate bilateral upper lobe pulmonary nodules, likely postinflammatory in etiology although metastatic disease cannot definitely be excluded. Recommend continued follow-up by chest CT in 6 months. Dominant 2.4 cm left thyroid lobe nodule. Consider thyroid ultrasound for further evaluation. This follows ACR consensus guidelines: Managing Incidental Thyroid Nodules Detected on Imaging: White Paper of the ACR Incidental Thyroid Findings Committee. J Am Coll Radiol 2015; 12:143-150.  Electronically Signed   By: Earle Gell M.D.   On: 12/21/2017 15:55   Ct Abdomen Pelvis W Contrast  Result Date: 12/21/2017 CLINICAL DATA:  Newly diagnosed high-grade endometrial carcinoma. 3 weeks postop from TAH-BSO. Staging. EXAM: CT CHEST, ABDOMEN, AND PELVIS WITH CONTRAST TECHNIQUE: Multidetector CT imaging of the chest, abdomen and pelvis was performed following the standard protocol during bolus administration of intravenous contrast. CONTRAST:  149m  ISOVUE-300 IOPAMIDOL (ISOVUE-300) INJECTION 61% COMPARISON:  AP CT on 11/23/2017 FINDINGS: CT CHEST FINDINGS Cardiovascular: No acute findings. Mediastinum/Lymph Nodes: No pathologically enlarged lymph nodes identified. Dominant nodule seen in the inferior left thyroid lobe measuring 2.4 cm. Lungs/Pleura: No pulmonary infiltrate or pleural effusion. A few tiny scattered bilateral upper lobe pulmonary nodules are seen measuring less than 5 mm, which are indeterminate. Musculoskeletal:  No suspicious bone lesions identified. CT ABDOMEN AND PELVIS FINDINGS Hepatobiliary: No masses identified. Prior cholecystectomy. No evidence of biliary obstruction. Pancreas:  No mass or inflammatory changes. Spleen:  Within normal limits in size and appearance. Adrenals/Urinary tract: No masses or hydronephrosis. Unremarkable unopacified urinary bladder. Stomach/Bowel: No evidence of obstruction, inflammatory process, or abnormal fluid collections. Diverticulosis is seen mainly involving the sigmoid colon, however there is no evidence of diverticulitis. Vascular/Lymphatic: No pathologically enlarged lymph nodes identified. No abdominal aortic aneurysm. Reproductive: Prior hysterectomy noted. Adnexal regions are unremarkable in appearance. No pelvic mass or abnormal fluid collections identified. Other:  None. Musculoskeletal:  No suspicious bone lesions identified. IMPRESSION: Prior hysterectomy. No evidence of residual or metastatic carcinoma within the abdomen or pelvis. Tiny indeterminate bilateral upper lobe pulmonary nodules, likely postinflammatory in etiology although metastatic disease cannot definitely be excluded. Recommend continued follow-up by chest CT in 6 months. Dominant 2.4 cm left thyroid lobe nodule. Consider thyroid ultrasound for further evaluation. This follows ACR consensus guidelines: Managing Incidental Thyroid Nodules Detected on Imaging: White Paper of the ACR Incidental Thyroid Findings Committee. J Am Coll  Radiol 2015; 12:143-150. Electronically Signed   By: JEarle GellM.D.   On: 12/21/2017 15:55   Ir UK[place]Guide Vasc Access Right  Result Date: 12/29/2017 INDICATION: History of endometrial cancer. In need of durable intravenous access for chemotherapy administration. EXAM: IMPLANTED PORT A CATH PLACEMENT WITH ULTRASOUND AND FLUOROSCOPIC GUIDANCE COMPARISON:  None. MEDICATIONS: Ancef 2 gm IV; The antibiotic was administered within an appropriate time interval prior to skin puncture. ANESTHESIA/SEDATION: Moderate (conscious) sedation was employed during this procedure. A total of Versed 2.5 mg and Fentanyl 150 mcg was administered intravenously. Moderate Sedation Time: 26 minutes. The patient's level of consciousness and vital signs were monitored continuously by radiology nursing throughout the procedure under my direct supervision. CONTRAST:  None FLUOROSCOPY TIME:  54 seconds (11 mGy) COMPLICATIONS: None immediate. PROCEDURE: The procedure, risks, benefits, and alternatives were explained to the patient. Questions regarding the procedure were encouraged and answered. The patient understands and consents to the procedure. The right neck and chest were prepped with chlorhexidine in a sterile fashion, and a sterile drape was applied covering the operative field. Maximum barrier sterile technique with sterile gowns and gloves were used for the procedure. A timeout was performed prior to the initiation of the procedure. Local anesthesia was provided with 1% lidocaine with epinephrine. After creating a small venotomy incision, a micropuncture kit was utilized to access the internal jugular vein. Real-time ultrasound guidance was utilized for vascular access including the acquisition of a permanent ultrasound image documenting patency of the accessed vessel. The microwire was utilized to measure appropriate catheter  length. A subcutaneous port pocket was then created along the upper chest wall utilizing a combination of  sharp and blunt dissection. The pocket was irrigated with sterile saline. A single lumen ISP power injectable port was chosen for placement. The 8 Fr catheter was tunneled from the port pocket site to the venotomy incision. The port was placed in the pocket. The external catheter was trimmed to appropriate length. At the venotomy, an 8 Fr peel-away sheath was placed over a guidewire under fluoroscopic guidance. The catheter was then placed through the sheath and the sheath was removed. Final catheter positioning was confirmed and documented with a fluoroscopic spot radiograph. The port was accessed with a Huber needle, aspirated and flushed with heparinized saline. The venotomy site was closed with an interrupted 4-0 Vicryl suture. The port pocket incision was closed with interrupted 2-0 Vicryl suture and the skin was opposed with a running subcuticular 4-0 Vicryl suture. Dermabond and Steri-strips were applied to both incisions. Dressings were placed. The patient tolerated the procedure well without immediate post procedural complication. FINDINGS: After catheter placement, the tip lies within the superior cavoatrial junction. The catheter aspirates and flushes normally and is ready for immediate use. IMPRESSION: Successful placement of a right internal jugular approach power injectable Port-A-Cath. The catheter is ready for immediate use. Electronically Signed   By: Sandi Mariscal M.D.   On: 12/29/2017 13:59   Ir Fluoro Guide Port Insertion Right  Result Date: 12/29/2017 INDICATION: History of endometrial cancer. In need of durable intravenous access for chemotherapy administration. EXAM: IMPLANTED PORT A CATH PLACEMENT WITH ULTRASOUND AND FLUOROSCOPIC GUIDANCE COMPARISON:  None. MEDICATIONS: Ancef 2 gm IV; The antibiotic was administered within an appropriate time interval prior to skin puncture. ANESTHESIA/SEDATION: Moderate (conscious) sedation was employed during this procedure. A total of Versed 2.5 mg and  Fentanyl 150 mcg was administered intravenously. Moderate Sedation Time: 26 minutes. The patient's level of consciousness and vital signs were monitored continuously by radiology nursing throughout the procedure under my direct supervision. CONTRAST:  None FLUOROSCOPY TIME:  54 seconds (11 mGy) COMPLICATIONS: None immediate. PROCEDURE: The procedure, risks, benefits, and alternatives were explained to the patient. Questions regarding the procedure were encouraged and answered. The patient understands and consents to the procedure. The right neck and chest were prepped with chlorhexidine in a sterile fashion, and a sterile drape was applied covering the operative field. Maximum barrier sterile technique with sterile gowns and gloves were used for the procedure. A timeout was performed prior to the initiation of the procedure. Local anesthesia was provided with 1% lidocaine with epinephrine. After creating a small venotomy incision, a micropuncture kit was utilized to access the internal jugular vein. Real-time ultrasound guidance was utilized for vascular access including the acquisition of a permanent ultrasound image documenting patency of the accessed vessel. The microwire was utilized to measure appropriate catheter length. A subcutaneous port pocket was then created along the upper chest wall utilizing a combination of sharp and blunt dissection. The pocket was irrigated with sterile saline. A single lumen ISP power injectable port was chosen for placement. The 8 Fr catheter was tunneled from the port pocket site to the venotomy incision. The port was placed in the pocket. The external catheter was trimmed to appropriate length. At the venotomy, an 8 Fr peel-away sheath was placed over a guidewire under fluoroscopic guidance. The catheter was then placed through the sheath and the sheath was removed. Final catheter positioning was confirmed and documented with a fluoroscopic spot radiograph.  The port was  accessed with a Huber needle, aspirated and flushed with heparinized saline. The venotomy site was closed with an interrupted 4-0 Vicryl suture. The port pocket incision was closed with interrupted 2-0 Vicryl suture and the skin was opposed with a running subcuticular 4-0 Vicryl suture. Dermabond and Steri-strips were applied to both incisions. Dressings were placed. The patient tolerated the procedure well without immediate post procedural complication. FINDINGS: After catheter placement, the tip lies within the superior cavoatrial junction. The catheter aspirates and flushes normally and is ready for immediate use. IMPRESSION: Successful placement of a right internal jugular approach power injectable Port-A-Cath. The catheter is ready for immediate use. Electronically Signed   By: Sandi Mariscal M.D.   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 Based on the pathologic findings, the patient would be at risk for local recurrence of her endometrial cancer in the vaginal cuff region, and I would agree with Dr. Serita Grit recommendations for vaginal brachytherapy. We discussed the course of treatment, side effects, and long term toxicities associated with treatment. The patient appears to understand and wishes to proceed with treatment. A consent form was signed and a copy was provided for the patient.  PLAN: CT simulation and treatment planning will be scheduled in April after her second round of chemotherapy.      ------------------------------------------------  Blair Promise, PhD, MD  This document serves as a record of services personally performed by Gery Pray, MD. It was created on his behalf by Bethann Humble, a trained medical scribe. The creation of this record is based on the scribe's personal observations and the provider's statements to them. This document has been checked and  approved by the attending provider.

## 2018-01-10 ENCOUNTER — Telehealth: Payer: Self-pay | Admitting: *Deleted

## 2018-01-10 ENCOUNTER — Telehealth: Payer: Self-pay | Admitting: Hematology and Oncology

## 2018-01-10 NOTE — Telephone Encounter (Signed)
01/10/18 @ 9:45 am, spoke with patient informing FMLA for son, Connie Simmons has been successfully faxed to [employer] at [phone]. Patient requested a copy be mailed to her address on file.

## 2018-01-10 NOTE — Telephone Encounter (Signed)
Called patient to inform of [facility], spoke with patient and she is aware of these appts. 

## 2018-01-23 ENCOUNTER — Encounter: Payer: PRIVATE HEALTH INSURANCE | Admitting: Genetic Counselor

## 2018-01-23 ENCOUNTER — Telehealth: Payer: Self-pay | Admitting: Hematology and Oncology

## 2018-01-23 ENCOUNTER — Encounter: Payer: Self-pay | Admitting: Hematology and Oncology

## 2018-01-23 ENCOUNTER — Inpatient Hospital Stay: Payer: No Typology Code available for payment source | Attending: Gynecologic Oncology

## 2018-01-23 ENCOUNTER — Other Ambulatory Visit: Payer: PRIVATE HEALTH INSURANCE

## 2018-01-23 ENCOUNTER — Inpatient Hospital Stay: Payer: No Typology Code available for payment source

## 2018-01-23 ENCOUNTER — Inpatient Hospital Stay (HOSPITAL_BASED_OUTPATIENT_CLINIC_OR_DEPARTMENT_OTHER): Payer: No Typology Code available for payment source | Admitting: Hematology and Oncology

## 2018-01-23 ENCOUNTER — Other Ambulatory Visit: Payer: Self-pay | Admitting: Hematology and Oncology

## 2018-01-23 DIAGNOSIS — D6481 Anemia due to antineoplastic chemotherapy: Secondary | ICD-10-CM

## 2018-01-23 DIAGNOSIS — G620 Drug-induced polyneuropathy: Secondary | ICD-10-CM

## 2018-01-23 DIAGNOSIS — C541 Malignant neoplasm of endometrium: Secondary | ICD-10-CM | POA: Insufficient documentation

## 2018-01-23 DIAGNOSIS — M898X9 Other specified disorders of bone, unspecified site: Secondary | ICD-10-CM | POA: Insufficient documentation

## 2018-01-23 DIAGNOSIS — Z95828 Presence of other vascular implants and grafts: Secondary | ICD-10-CM

## 2018-01-23 DIAGNOSIS — D61818 Other pancytopenia: Secondary | ICD-10-CM | POA: Insufficient documentation

## 2018-01-23 DIAGNOSIS — T451X5A Adverse effect of antineoplastic and immunosuppressive drugs, initial encounter: Secondary | ICD-10-CM

## 2018-01-23 DIAGNOSIS — C7A8 Other malignant neuroendocrine tumors: Secondary | ICD-10-CM | POA: Insufficient documentation

## 2018-01-23 DIAGNOSIS — Z5111 Encounter for antineoplastic chemotherapy: Secondary | ICD-10-CM | POA: Diagnosis present

## 2018-01-23 DIAGNOSIS — M898X8 Other specified disorders of bone, other site: Secondary | ICD-10-CM | POA: Diagnosis not present

## 2018-01-23 LAB — CMP (CANCER CENTER ONLY)
ALT: 27 U/L (ref 0–55)
AST: 19 U/L (ref 5–34)
Albumin: 3.9 g/dL (ref 3.5–5.0)
Alkaline Phosphatase: 99 U/L (ref 40–150)
Anion gap: 10 (ref 3–11)
BUN: 16 mg/dL (ref 7–26)
CO2: 23 mmol/L (ref 22–29)
Calcium: 9.6 mg/dL (ref 8.4–10.4)
Chloride: 102 mmol/L (ref 98–109)
Creatinine: 0.82 mg/dL (ref 0.60–1.10)
GFR, Est AFR Am: >60 mL/min (ref >60)
GFR, Estimated: >60 mL/min (ref >60)
Glucose, Bld: 126 mg/dL (ref 70–140)
Potassium: 4 mmol/L (ref 3.5–5.1)
Sodium: 135 mmol/L — ABNORMAL LOW (ref 136–145)
Total Bilirubin: 0.3 mg/dL (ref 0.2–1.2)
Total Protein: 7.2 g/dL (ref 6.4–8.3)

## 2018-01-23 LAB — CBC WITH DIFFERENTIAL (CANCER CENTER ONLY)
Basophils Absolute: 0 10*3/uL (ref 0.0–0.1)
Basophils Relative: 0 %
Eosinophils Absolute: 0 10*3/uL (ref 0.0–0.5)
Eosinophils Relative: 0 %
HCT: 32.5 % — ABNORMAL LOW (ref 34.8–46.6)
Hemoglobin: 10.7 g/dL — ABNORMAL LOW (ref 11.6–15.9)
Lymphocytes Relative: 7 %
Lymphs Abs: 0.7 10*3/uL — ABNORMAL LOW (ref 0.9–3.3)
MCH: 27.3 pg (ref 25.1–34.0)
MCHC: 32.9 g/dL (ref 31.5–36.0)
MCV: 82.9 fL (ref 79.5–101.0)
Monocytes Absolute: 0.1 10*3/uL (ref 0.1–0.9)
Monocytes Relative: 1 %
Neutro Abs: 9 10*3/uL — ABNORMAL HIGH (ref 1.5–6.5)
Neutrophils Relative %: 92 %
Platelet Count: 381 10*3/uL (ref 145–400)
RBC: 3.92 MIL/uL (ref 3.70–5.45)
RDW: 15 % — ABNORMAL HIGH (ref 11.2–14.5)
WBC Count: 9.8 10*3/uL (ref 3.9–10.3)

## 2018-01-23 MED ORDER — PALONOSETRON HCL INJECTION 0.25 MG/5ML
0.2500 mg | Freq: Once | INTRAVENOUS | Status: AC
  Administered 2018-01-23: 0.25 mg via INTRAVENOUS

## 2018-01-23 MED ORDER — PEGFILGRASTIM 6 MG/0.6ML ~~LOC~~ PSKT
PREFILLED_SYRINGE | SUBCUTANEOUS | Status: AC
  Filled 2018-01-23: qty 0.6

## 2018-01-23 MED ORDER — HEPARIN SOD (PORK) LOCK FLUSH 100 UNIT/ML IV SOLN
500.0000 [IU] | Freq: Once | INTRAVENOUS | Status: DC | PRN
  Filled 2018-01-23: qty 5

## 2018-01-23 MED ORDER — PEGFILGRASTIM 6 MG/0.6ML ~~LOC~~ PSKT
6.0000 mg | PREFILLED_SYRINGE | Freq: Once | SUBCUTANEOUS | Status: AC
  Administered 2018-01-23: 6 mg via SUBCUTANEOUS

## 2018-01-23 MED ORDER — FAMOTIDINE IN NACL 20-0.9 MG/50ML-% IV SOLN
20.0000 mg | Freq: Once | INTRAVENOUS | Status: AC
Start: 2018-01-23 — End: 2018-01-23
  Administered 2018-01-23: 20 mg via INTRAVENOUS

## 2018-01-23 MED ORDER — DIPHENHYDRAMINE HCL 50 MG/ML IJ SOLN
INTRAMUSCULAR | Status: AC
  Filled 2018-01-23: qty 1

## 2018-01-23 MED ORDER — PALONOSETRON HCL INJECTION 0.25 MG/5ML
INTRAVENOUS | Status: AC
  Filled 2018-01-23: qty 5

## 2018-01-23 MED ORDER — DIPHENHYDRAMINE HCL 50 MG/ML IJ SOLN
50.0000 mg | Freq: Once | INTRAMUSCULAR | Status: AC
  Administered 2018-01-23: 50 mg via INTRAVENOUS

## 2018-01-23 MED ORDER — SODIUM CHLORIDE 0.9% FLUSH
10.0000 mL | Freq: Once | INTRAVENOUS | Status: AC
  Administered 2018-01-23: 10 mL
  Filled 2018-01-23: qty 10

## 2018-01-23 MED ORDER — FAMOTIDINE IN NACL 20-0.9 MG/50ML-% IV SOLN
INTRAVENOUS | Status: AC
  Filled 2018-01-23: qty 50

## 2018-01-23 MED ORDER — PACLITAXEL CHEMO INJECTION 300 MG/50ML
131.2500 mg/m2 | Freq: Once | INTRAVENOUS | Status: AC
  Administered 2018-01-23: 282 mg via INTRAVENOUS
  Filled 2018-01-23: qty 47

## 2018-01-23 MED ORDER — SODIUM CHLORIDE 0.9% FLUSH
10.0000 mL | INTRAVENOUS | Status: DC | PRN
  Filled 2018-01-23: qty 10

## 2018-01-23 MED ORDER — FOSAPREPITANT DIMEGLUMINE INJECTION 150 MG
Freq: Once | INTRAVENOUS | Status: AC
  Administered 2018-01-23: 13:00:00 via INTRAVENOUS
  Filled 2018-01-23: qty 5

## 2018-01-23 MED ORDER — SODIUM CHLORIDE 0.9 % IV SOLN
Freq: Once | INTRAVENOUS | Status: AC
  Administered 2018-01-23: 11:00:00 via INTRAVENOUS

## 2018-01-23 MED ORDER — SODIUM CHLORIDE 0.9 % IV SOLN
660.0000 mg | Freq: Once | INTRAVENOUS | Status: AC
  Administered 2018-01-23: 660 mg via INTRAVENOUS
  Filled 2018-01-23: qty 66

## 2018-01-23 NOTE — Progress Notes (Signed)
Patient completed aloxi, benadryl and then pepcid.  At end of pepcid pt. C/o burning near [facility] sight - to the distal and above.  Normal saline stopped and PAC flushed with normal saline - excellent blood return, however patient c/o burning in the same area with NS flush.  This is patient's second treatment with PAC and she did not have this burning with the first tx.  Stopped all IVF.  Called and spoke with Dr. Calton Dach nurse Cherre Huger and she will check with Dr. Alvy Bimler.

## 2018-01-23 NOTE — Progress Notes (Signed)
[facility] OFFICE PROGRESS NOTE  Patient Care Team: Kelton Pillar, MD as PCP - General (Family Medicine)  ASSESSMENT & PLAN:  Endometrial cancer [facility]) She tolerated treatment poorly with recent gastric reflux, her peripheral neuropathy and bone pain I plan to reduce a dose of Taxol She is instructed to take tramadol along with Tylenol for bone pain related to G-CSF She is instructed to take calcium carbonate for acid reflux along with Mylanta I will see her back prior to next dose of treatment  Peripheral neuropathy due to chemotherapy [facility]) she has mild peripheral neuropathy, likely related to side effects of treatment. I plan to reduce the dose of treatment as outlined above.  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 The patient denies recent history of bleeding such as epistaxis, hematuria or hematochezia. She is asymptomatic from the anemia. I will observe for now.  She does not require transfusion now. I will continue the chemotherapy at current dose without dosage adjustment.  If the anemia gets progressive worse in the future, I might have to delay her treatment or adjust the chemotherapy dose.    No orders of the defined types were placed in this encounter.   INTERVAL HISTORY: Please see below for problem oriented charting. 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 - SEE COMMENT. Microscopic Comment Sections are of a high grade carcinoma with mixed glandular, papillary and solid patterns. The histomorphology and immunostain pattern of ER negative, PR negative, p53 positive and p16 positive is compatible with a serous carcinoma. Internal departmental review obtained (Dr. Lyndon Code) with agreement. The results were called to Dr. Nelda Marseille on on 11/14/17. (MEG:kh 11/14/17)      11/23/2017 Imaging    1. Grossly thickened and heterogeneous endometrium consistent with known endometrial carcinoma. No CT findings to suggest serosal or extra uterine direct extension. No pelvic adenopathy.  2. There are worrisome small peritoneal nodules and a small mesenteric nodal mass in the root of the small bowel mesentery suspicious for metastatic disease. 3. No metastatic pulmonary nodules at the lung bases or evidence of solid abdominal organ metastatic disease.       12/01/2017 Pathology Results    1. Lymph node, sentinel, biopsy, right obturator - ONE BENIGN LYMPH NODE (0/1). 2. Peritoneum, biopsy, peritoneal nodule - NEUROENDOCRINE TUMOR. - SEE COMMENT. 3. Lymph node, sentinel, biopsy, left obturator - SEVEN BENIGN LYMPH NODES (0/7). 4. Uterus +/- tubes/ovaries, neoplastic ENDOMETRIUM. - HIGH GRADE SEROUS CARCINOMA, 3.7 CM. - CARCINOMA FOCALLY INVOLVES OUTER HALF OF MYOMETRIUM. - CERVIX, RIGHT OVARY AND BILATERAL  FALLOPIAN TUBES FREE OF TUMOR. LEFT OVARY: - NEUROENDOCRINE TUMOR. - SEE COMMENT. Microscopic Comment 4. ONCOLOGY TABLE-UTERUS, CARCINOMA OR CARCINOSARCOMA Specimen: Uterus with bilateral  ovaries and fallopian tubes with right and left obturator sentinel lymph nodes and peritoneal nodule biopsy. Procedure: Hysterectomy with bilateral salpingo-oophorectomy with right and left obturator lymph node biopsies. Lymph node sampling performed: Yes. Specimen integrity: Intact with cervix and body separated. Maximum tumor size: 3.7 cm Histologic type: High grade serous carcinoma. Grade: III. Myometrial invasion: 1.2 cm where myometrium is 2 cm in thickness. Cervical stromal involvement: No. Extent of involvement of other organs: High grade serous carcinoma involving endometrium and myometrium. Lymph - vascular invasion: No  Peritoneal washings: Pending. Lymph nodes: Examined: 7 Sentinel 0 Non-sentinel 7 Total Lymph nodes with metastasis: 0 Isolated tumor cells (< 0.2 mm): 0 Micrometastasis: (> 0.2 mm and < 2.0 mm): 0 Macrometastasis: (> 2.0 mm): 0 Extracapsular extension: N/A Pelvic lymph nodes: 0 involved of 8 lymph nodes. Para-aortic lymph nodes: 0 involved of 0 lymph nodes. Other (specify involvement and site): N/A TNM code: pT1b, pN0 FIGO Stage (based on pathologic findings, needs clinical correlation): IB. COMMENT: The endometrial tumor is 3.7 cm in greatest dimension and is a high grade serous carcinoma which focally invades the outer half of the myometrium.  The left ovary shows a 1 cm nodule of neoplasm characterized by sheets of cells with uniform, round to oval nuclei with rare mitotic figures. Iimmunohistochemistry is performed and the left ovarian tumor is positive with cytokeratin AE1/AE3, CD56, chromogranin, synaptophysin and CDX-2. The tumor is negative with inhibin, WT-1, calretinin, CD99, cytokerain 20, cytokeratin 7, estrogen receptor, progesterone receptor, vimentin, and p53. The morphology and immunophenotype are consistent with a low to intermediate grade neuroendocrine tumor and CDX-2 positivity suggests possible gastrointestinal origin. There is a similar  0.6 cm nodule in the paraovarian connective tissue and the peritoneal nodule (part 2) has identical features.       12/21/2017 Imaging    Prior hysterectomy. No evidence of residual or metastatic carcinoma within the abdomen or pelvis.  Tiny indeterminate bilateral upper lobe pulmonary nodules, likely postinflammatory in etiology although metastatic disease cannot definitely be excluded. Recommend continued follow-up by chest CT in 6 months.  Dominant 2.4 cm left thyroid lobe nodule. Consider thyroid ultrasound for further evaluation.       12/29/2017 Procedure    Successful placement of a right internal jugular approach power injectable Port-A-Cath. The catheter is ready for immediate use.       REVIEW OF SYSTEMS:   Constitutional: Denies fevers, chills or abnormal weight loss Eyes: Denies blurriness of vision Ears, nose, mouth, throat, and face: Denies mucositis or sore throat Respiratory: Denies cough, dyspnea or wheezes Cardiovascular: Denies palpitation, chest discomfort or lower extremity swelling Gastrointestinal:  Denies nausea, heartburn or change in bowel habits Skin: Denies abnormal skin rashes Lymphatics: Denies new lymphadenopathy or easy bruising Neurological:Denies numbness, tingling or new weaknesses Behavioral/Psych: Mood is stable, no new changes  All other systems were reviewed with the patient and are negative.  I have reviewed the past medical history, past surgical history, social history and family history with the patient and they are unchanged from previous note.  ALLERGIES:  is allergic to fish oil; simvastatin; codeine; and sulfa antibiotics.  MEDICATIONS:  Current Outpatient Medications  Medication Sig Dispense Refill  . acetaminophen (TYLENOL) 500 MG tablet Take 1,000 mg by mouth 2 (two) times daily as needed for moderate pain or headache.    Marland Kitchen alum &  mag hydroxide-simeth (MAALOX/MYLANTA) 200-200-20 MG/5ML suspension Take by mouth every 6 (six) hours as  needed for indigestion or heartburn.    . benazepril-hydrochlorthiazide (LOTENSIN HCT) 20-12.5 MG tablet Take 2 tablets by mouth every morning.   1  . calcium carbonate (TUMS - DOSED IN MG ELEMENTAL CALCIUM) 500 MG chewable tablet Chew 2 tablets by mouth daily.    Marland Kitchen dexamethasone (DECADRON) 4 MG tablet Take 5 tabs the night before and 5 tabs the morning of chemotherapy, every 3 weeks, with food 60 tablet 0  . fluticasone (FLONASE) 50 MCG/ACT nasal spray Place 1 spray into both nostrils daily as needed for allergies or rhinitis.    Marland Kitchen ibuprofen (ADVIL,MOTRIN) 200 MG tablet Take 400-600 mg by mouth 2 (two) times daily as needed for moderate pain.     Marland Kitchen lidocaine-prilocaine (EMLA) cream Apply to affected area once 30 g 3  . loratadine (CLARITIN) 10 MG tablet Take 10 mg by mouth daily.    . Multiple Vitamin (MULTIVITAMIN WITH MINERALS) TABS tablet Take 1 tablet by mouth daily.    . ondansetron (ZOFRAN) 8 MG tablet Take 1 tablet (8 mg total) by mouth every 8 (eight) hours as needed for refractory nausea / vomiting. Start on day 3 after chemo. (Patient not taking: Reported on 01/09/2018) 30 tablet 1  . prochlorperazine (COMPAZINE) 10 MG tablet Take 1 tablet (10 mg total) by mouth every 6 (six) hours as needed (Nausea or vomiting). (Patient not taking: Reported on 01/09/2018) 30 tablet 1  . traMADol (ULTRAM) 50 MG tablet Take 1 tablet (50 mg total) by mouth every 6 (six) hours as needed. 30 tablet 0   No current facility-administered medications for this visit.     PHYSICAL EXAMINATION: ECOG PERFORMANCE STATUS: 1 - Symptomatic but completely ambulatory  Vitals:   01/23/18 0946  BP: 116/70  Pulse: 76  Resp: 18  Temp: 97.6 F (36.4 C)  SpO2: 100%   Filed Weights   01/23/18 0946  Weight: 224 lb 11.2 oz (101.9 kg)    GENERAL:alert, no distress and comfortable SKIN: skin color, texture, turgor are normal, no rashes or significant lesions EYES: normal, Conjunctiva are pink and non-injected, sclera  clear OROPHARYNX:no exudate, no erythema and lips, buccal mucosa, and tongue normal  NECK: supple, thyroid normal size, non-tender, without nodularity LYMPH:  no palpable lymphadenopathy in the cervical, axillary or inguinal LUNGS: clear to auscultation and percussion with normal breathing effort HEART: regular rate & rhythm and no murmurs and no lower extremity edema ABDOMEN:abdomen soft, non-tender and normal bowel sounds Musculoskeletal:no cyanosis of digits and no clubbing  NEURO: alert & oriented x 3 with fluent speech, no focal motor/sensory deficits  LABORATORY DATA:  I have reviewed the data as listed    Component Value Date/Time   NA 135 (L) 01/23/2018 0841   K 4.0 01/23/2018 0841   CL 102 01/23/2018 0841   CO2 23 01/23/2018 0841   GLUCOSE 126 01/23/2018 0841   BUN 16 01/23/2018 0841   CREATININE 0.82 01/23/2018 0841   CALCIUM 9.6 01/23/2018 0841   PROT 7.2 01/23/2018 0841   ALBUMIN 3.9 01/23/2018 0841   AST 19 01/23/2018 0841   ALT 27 01/23/2018 0841   ALKPHOS 99 01/23/2018 0841   BILITOT 0.3 01/23/2018 0841   GFRNONAA >60 01/23/2018 0841   GFRAA >60 01/23/2018 0841    No results found for: SPEP, UPEP  Lab Results  Component Value Date   WBC 9.8 01/23/2018   NEUTROABS 9.0 (H) 01/23/2018  HGB 11.3 (L) 12/29/2017   HCT 32.5 (L) 01/23/2018   MCV 82.9 01/23/2018   PLT 381 01/23/2018      Chemistry      Component Value Date/Time   NA 135 (L) 01/23/2018 0841   K 4.0 01/23/2018 0841   CL 102 01/23/2018 0841   CO2 23 01/23/2018 0841   BUN 16 01/23/2018 0841   CREATININE 0.82 01/23/2018 0841      Component Value Date/Time   CALCIUM 9.6 01/23/2018 0841   ALKPHOS 99 01/23/2018 0841   AST 19 01/23/2018 0841   ALT 27 01/23/2018 0841   BILITOT 0.3 01/23/2018 0841       RADIOGRAPHIC STUDIES: I have personally reviewed the radiological images as listed and agreed with the findings in the report. Ir US Guide Vasc Access Right  Result Date:  12/29/2017 INDICATION: History of endometrial cancer. In need of durable intravenous access for chemotherapy administration. EXAM: IMPLANTED PORT A CATH PLACEMENT WITH ULTRASOUND AND FLUOROSCOPIC GUIDANCE COMPARISON:  None. MEDICATIONS: Ancef 2 gm IV; The antibiotic was administered within an appropriate time interval prior to skin puncture. ANESTHESIA/SEDATION: Moderate (conscious) sedation was employed during this procedure. A total of Versed 2.5 mg and Fentanyl 150 mcg was administered intravenously. Moderate Sedation Time: 26 minutes. The patient's level of consciousness and vital signs were monitored continuously by radiology nursing throughout the procedure under my direct supervision. CONTRAST:  None FLUOROSCOPY TIME:  54 seconds (11 mGy) COMPLICATIONS: None immediate. PROCEDURE: The procedure, risks, benefits, and alternatives were explained to the patient. Questions regarding the procedure were encouraged and answered. The patient understands and consents to the procedure. The right neck and chest were prepped with chlorhexidine in a sterile fashion, and a sterile drape was applied covering the operative field. Maximum barrier sterile technique with sterile gowns and gloves were used for the procedure. A timeout was performed prior to the initiation of the procedure. Local anesthesia was provided with 1% lidocaine with epinephrine. After creating a small venotomy incision, a micropuncture kit was utilized to access the internal jugular vein. Real-time ultrasound guidance was utilized for vascular access including the acquisition of a permanent ultrasound image documenting patency of the accessed vessel. The microwire was utilized to measure appropriate catheter length. A subcutaneous port pocket was then created along the upper chest wall utilizing a combination of sharp and blunt dissection. The pocket was irrigated with sterile saline. A single lumen ISP power injectable port was chosen for placement. The  8 Fr catheter was tunneled from the port pocket site to the venotomy incision. The port was placed in the pocket. The external catheter was trimmed to appropriate length. At the venotomy, an 8 Fr peel-away sheath was placed over a guidewire under fluoroscopic guidance. The catheter was then placed through the sheath and the sheath was removed. Final catheter positioning was confirmed and documented with a fluoroscopic spot radiograph. The port was accessed with a Huber needle, aspirated and flushed with heparinized saline. The venotomy site was closed with an interrupted 4-0 Vicryl suture. The port pocket incision was closed with interrupted 2-0 Vicryl suture and the skin was opposed with a running subcuticular 4-0 Vicryl suture. Dermabond and Steri-strips were applied to both incisions. Dressings were placed. The patient tolerated the procedure well without immediate post procedural complication. FINDINGS: After catheter placement, the tip lies within the superior cavoatrial junction. The catheter aspirates and flushes normally and is ready for immediate use. IMPRESSION: Successful placement of a right internal jugular approach power injectable  Port-A-Cath. The catheter is ready for immediate use. Electronically Signed   By: Sandi Mariscal M.D.   On: 12/29/2017 13:59   Ir Fluoro Guide Port Insertion Right  Result Date: 12/29/2017 INDICATION: History of endometrial cancer. In need of durable intravenous access for chemotherapy administration. EXAM: IMPLANTED PORT A CATH PLACEMENT WITH ULTRASOUND AND FLUOROSCOPIC GUIDANCE COMPARISON:  None. MEDICATIONS: Ancef 2 gm IV; The antibiotic was administered within an appropriate time interval prior to skin puncture. ANESTHESIA/SEDATION: Moderate (conscious) sedation was employed during this procedure. A total of Versed 2.5 mg and Fentanyl 150 mcg was administered intravenously. Moderate Sedation Time: 26 minutes. The patient's level of consciousness and vital signs were  monitored continuously by radiology nursing throughout the procedure under my direct supervision. CONTRAST:  None FLUOROSCOPY TIME:  54 seconds (11 mGy) COMPLICATIONS: None immediate. PROCEDURE: The procedure, risks, benefits, and alternatives were explained to the patient. Questions regarding the procedure were encouraged and answered. The patient understands and consents to the procedure. The right neck and chest were prepped with chlorhexidine in a sterile fashion, and a sterile drape was applied covering the operative field. Maximum barrier sterile technique with sterile gowns and gloves were used for the procedure. A timeout was performed prior to the initiation of the procedure. Local anesthesia was provided with 1% lidocaine with epinephrine. After creating a small venotomy incision, a micropuncture kit was utilized to access the internal jugular vein. Real-time ultrasound guidance was utilized for vascular access including the acquisition of a permanent ultrasound image documenting patency of the accessed vessel. The microwire was utilized to measure appropriate catheter length. A subcutaneous port pocket was then created along the upper chest wall utilizing a combination of sharp and blunt dissection. The pocket was irrigated with sterile saline. A single lumen ISP power injectable port was chosen for placement. The 8 Fr catheter was tunneled from the port pocket site to the venotomy incision. The port was placed in the pocket. The external catheter was trimmed to appropriate length. At the venotomy, an 8 Fr peel-away sheath was placed over a guidewire under fluoroscopic guidance. The catheter was then placed through the sheath and the sheath was removed. Final catheter positioning was confirmed and documented with a fluoroscopic spot radiograph. The port was accessed with a Huber needle, aspirated and flushed with heparinized saline. The venotomy site was closed with an interrupted 4-0 Vicryl suture. The  port pocket incision was closed with interrupted 2-0 Vicryl suture and the skin was opposed with a running subcuticular 4-0 Vicryl suture. Dermabond and Steri-strips were applied to both incisions. Dressings were placed. The patient tolerated the procedure well without immediate post procedural complication. FINDINGS: After catheter placement, the tip lies within the superior cavoatrial junction. The catheter aspirates and flushes normally and is ready for immediate use. IMPRESSION: Successful placement of a right internal jugular approach power injectable Port-A-Cath. The catheter is ready for immediate use. Electronically Signed   By: Sandi Mariscal M.D.   On: 12/29/2017 13:59    All questions were answered. The patient knows to call the clinic with any problems, questions or concerns. No barriers to learning was detected.  I spent 20 minutes counseling the patient face to face. The total time spent in the appointment was 30 minutes and more than 50% was on counseling and review of test results  Heath Lark, MD 01/23/2018 10:53 AM

## 2018-01-23 NOTE — Assessment & Plan Note (Signed)
She tolerated treatment poorly with recent gastric reflux, her peripheral neuropathy and bone pain I plan to reduce a dose of Taxol She is instructed to take tramadol along with Tylenol for bone pain related to G-CSF She is instructed to take calcium carbonate for acid reflux along with Mylanta I will see her back prior to next dose of treatment

## 2018-01-23 NOTE — Assessment & Plan Note (Signed)
she has mild peripheral neuropathy, likely related to side effects of treatment. °I plan to reduce the dose of treatment as outlined above.  °I explained to the patient the rationale of this strategy and reassured the patient it would not compromise the efficacy of treatment ° °

## 2018-01-23 NOTE — Assessment & Plan Note (Signed)
She has severe bone pain with recent G-CSF injection I recommend tramadol along with Tylenol as needed

## 2018-01-23 NOTE — Patient Instructions (Signed)
Center Discharge Instructions for Patients Receiving Chemotherapy  Today you received the following chemotherapy agents Paclitaxel and Cabroplatin  To help prevent nausea and vomiting after your treatment, we encourage you to take your nausea medication. If you develop nausea and vomiting that is not controlled by your nausea medication, call the clinic.   BELOW ARE SYMPTOMS THAT SHOULD BE REPORTED IMMEDIATELY:  *FEVER GREATER THAN 100.5 F  *CHILLS WITH OR WITHOUT FEVER  NAUSEA AND VOMITING THAT IS NOT CONTROLLED WITH YOUR NAUSEA MEDICATION  *UNUSUAL SHORTNESS OF BREATH  *UNUSUAL BRUISING OR BLEEDING  TENDERNESS IN MOUTH AND THROAT WITH OR WITHOUT PRESENCE OF ULCERS  *URINARY PROBLEMS  *BOWEL PROBLEMS  UNUSUAL RASH Items with * indicate a potential emergency and should be followed up as soon as possible.  Feel free to call the clinic should you have any questions or concerns. The clinic phone number is [phone].  Please show the [facility] at check-in to the Emergency Department and triage nurse. Pegfilgrastim injection What is this medicine? PEGFILGRASTIM (PEG fil gra stim) is a long-acting granulocyte colony-stimulating factor that stimulates the growth of neutrophils, a type of white blood cell important in the body's fight against infection. It is used to reduce the incidence of fever and infection in patients with certain types of cancer who are receiving chemotherapy that affects the bone marrow, and to increase survival after being exposed to high doses of radiation. This medicine may be used for other purposes; ask your health care provider or pharmacist if you have questions. COMMON BRAND NAME(S): Neulasta What should I tell my health care provider before I take this medicine? They need to know if you have any of these conditions: -kidney disease -latex allergy -ongoing radiation therapy -sickle cell disease -skin reactions to acrylic  adhesives (On-Body Injector only) -an unusual or allergic reaction to pegfilgrastim, filgrastim, other medicines, foods, dyes, or preservatives -pregnant or trying to get pregnant -breast-feeding How should I use this medicine? This medicine is for injection under the skin. If you get this medicine at home, you will be taught how to prepare and give the pre-filled syringe or how to use the On-body Injector. Refer to the patient Instructions for Use for detailed instructions. Use exactly as directed. Tell your healthcare provider immediately if you suspect that the On-body Injector may not have performed as intended or if you suspect the use of the On-body Injector resulted in a missed or partial dose. It is important that you put your used needles and syringes in a special sharps container. Do not put them in a trash can. If you do not have a sharps container, call your pharmacist or healthcare provider to get one. Talk to your pediatrician regarding the use of this medicine in children. While this drug may be prescribed for selected conditions, precautions do apply. Overdosage: If you think you have taken too much of this medicine contact a poison control center or emergency room at once. NOTE: This medicine is only for you. Do not share this medicine with others. What if I miss a dose? It is important not to miss your dose. Call your doctor or health care professional if you miss your dose. If you miss a dose due to an On-body Injector failure or leakage, a new dose should be administered as soon as possible using a single prefilled syringe for manual use. What may interact with this medicine? Interactions have not been studied. Give your health care provider a  list of all the medicines, herbs, non-prescription drugs, or dietary supplements you use. Also tell them if you smoke, drink alcohol, or use illegal drugs. Some items may interact with your medicine. This list may not describe all possible  interactions. Give your health care provider a list of all the medicines, herbs, non-prescription drugs, or dietary supplements you use. Also tell them if you smoke, drink alcohol, or use illegal drugs. Some items may interact with your medicine. What should I watch for while using this medicine? You may need blood work done while you are taking this medicine. If you are going to need a MRI, CT scan, or other procedure, tell your doctor that you are using this medicine (On-Body Injector only). What side effects may I notice from receiving this medicine? Side effects that you should report to your doctor or health care professional as soon as possible: -allergic reactions like skin rash, itching or hives, swelling of the face, lips, or tongue -dizziness -fever -pain, redness, or irritation at site where injected -pinpoint red spots on the skin -red or dark-brown urine -shortness of breath or breathing problems -stomach or side pain, or pain at the shoulder -swelling -tiredness -trouble passing urine or change in the amount of urine Side effects that usually do not require medical attention (report to your doctor or health care professional if they continue or are bothersome): -bone pain -muscle pain This list may not describe all possible side effects. Call your doctor for medical advice about side effects. You may report side effects to FDA at 1-800-FDA-1088. Where should I keep my medicine? Keep out of the reach of children. Store pre-filled syringes in a refrigerator between 2 and 8 degrees C (36 and 46 degrees F). Do not freeze. Keep in carton to protect from light. Throw away this medicine if it is left out of the refrigerator for more than 48 hours. Throw away any unused medicine after the expiration date. NOTE: This sheet is a summary. It may not cover all possible information. If you have questions about this medicine, talk to your doctor, pharmacist, or health care provider.  2018  Elsevier/Gold Standard (2016-09-30 12:58:03)

## 2018-01-23 NOTE — Assessment & Plan Note (Signed)

## 2018-01-23 NOTE — Progress Notes (Signed)
After Carboplatin finished and flushed with normal saline. Complained of burning sensation at IV site. Checked for blood return. Good blood return noted.  No redness or edema noted. Instructed to call for problems.

## 2018-01-23 NOTE — Telephone Encounter (Signed)
Gave patient AVs and calendar of upcoming April and may appointments.  °

## 2018-01-25 ENCOUNTER — Ambulatory Visit: Payer: PRIVATE HEALTH INSURANCE

## 2018-01-26 ENCOUNTER — Encounter (HOSPITAL_COMMUNITY): Payer: Self-pay | Admitting: Interventional Radiology

## 2018-01-26 ENCOUNTER — Other Ambulatory Visit: Payer: Self-pay | Admitting: Hematology and Oncology

## 2018-01-26 ENCOUNTER — Ambulatory Visit (HOSPITAL_COMMUNITY)
Admission: RE | Admit: 2018-01-26 | Discharge: 2018-01-26 | Disposition: A | Discharge status: Home / Self Care | Payer: No Typology Code available for payment source | Source: Ambulatory Visit | Attending: Hematology and Oncology | Admitting: Hematology and Oncology

## 2018-01-26 DIAGNOSIS — Z888 Allergy status to other drugs, medicaments and biological substances status: Secondary | ICD-10-CM | POA: Insufficient documentation

## 2018-01-26 DIAGNOSIS — Z79899 Other long term (current) drug therapy: Secondary | ICD-10-CM | POA: Diagnosis not present

## 2018-01-26 DIAGNOSIS — M898X9 Other specified disorders of bone, unspecified site: Secondary | ICD-10-CM | POA: Insufficient documentation

## 2018-01-26 DIAGNOSIS — Z95828 Presence of other vascular implants and grafts: Secondary | ICD-10-CM

## 2018-01-26 DIAGNOSIS — T451X5A Adverse effect of antineoplastic and immunosuppressive drugs, initial encounter: Secondary | ICD-10-CM | POA: Diagnosis not present

## 2018-01-26 DIAGNOSIS — Z452 Encounter for adjustment and management of vascular access device: Secondary | ICD-10-CM | POA: Diagnosis not present

## 2018-01-26 DIAGNOSIS — X58XXXA Exposure to other specified factors, initial encounter: Secondary | ICD-10-CM | POA: Diagnosis not present

## 2018-01-26 DIAGNOSIS — Z885 Allergy status to narcotic agent status: Secondary | ICD-10-CM | POA: Insufficient documentation

## 2018-01-26 DIAGNOSIS — C541 Malignant neoplasm of endometrium: Secondary | ICD-10-CM | POA: Insufficient documentation

## 2018-01-26 DIAGNOSIS — G620 Drug-induced polyneuropathy: Secondary | ICD-10-CM | POA: Diagnosis not present

## 2018-01-26 DIAGNOSIS — D6481 Anemia due to antineoplastic chemotherapy: Secondary | ICD-10-CM | POA: Insufficient documentation

## 2018-01-26 DIAGNOSIS — Z882 Allergy status to sulfonamides status: Secondary | ICD-10-CM | POA: Insufficient documentation

## 2018-01-26 DIAGNOSIS — Z91018 Allergy to other foods: Secondary | ICD-10-CM | POA: Diagnosis not present

## 2018-01-26 HISTORY — PX: IR CV LINE INJECTION: IMG2294

## 2018-01-26 MED ORDER — HEPARIN SOD (PORK) LOCK FLUSH 100 UNIT/ML IV SOLN
INTRAVENOUS | Status: AC
  Administered 2018-01-26: 500 [IU]
  Filled 2018-01-26: qty 5

## 2018-01-26 MED ORDER — IOPAMIDOL (ISOVUE-300) INJECTION 61%
INTRAVENOUS | Status: AC
  Administered 2018-01-26: 10 mL via INTRAVENOUS
  Filled 2018-01-26: qty 50

## 2018-01-26 MED ORDER — IOPAMIDOL (ISOVUE-300) INJECTION 61%
50.0000 mL | Freq: Once | INTRAVENOUS | Status: AC | PRN
  Administered 2018-01-26: 10 mL via INTRAVENOUS

## 2018-02-08 ENCOUNTER — Telehealth: Payer: Self-pay | Admitting: *Deleted

## 2018-02-08 NOTE — Telephone Encounter (Signed)
CALLED PATIENT TO REMIND OF [facility] [place] FOR 02-09-18, SPOKE WITH PATIENT AND SHE IS AWARE OF THESE APPTS.

## 2018-02-09 ENCOUNTER — Ambulatory Visit
Admission: RE | Admit: 2018-02-09 | Discharge: 2018-02-09 | Disposition: A | Discharge status: Home / Self Care | Payer: No Typology Code available for payment source | Source: Ambulatory Visit | Attending: Radiation Oncology | Admitting: Radiation Oncology

## 2018-02-09 ENCOUNTER — Other Ambulatory Visit: Payer: Self-pay

## 2018-02-09 ENCOUNTER — Encounter: Payer: Self-pay | Admitting: Radiation Oncology

## 2018-02-09 VITALS — BP 113/73 | HR 75 | Temp 97.8°F | Resp 18 | Wt 224.0 lb

## 2018-02-09 DIAGNOSIS — C786 Secondary malignant neoplasm of retroperitoneum and peritoneum: Secondary | ICD-10-CM | POA: Insufficient documentation

## 2018-02-09 DIAGNOSIS — Z79899 Other long term (current) drug therapy: Secondary | ICD-10-CM | POA: Insufficient documentation

## 2018-02-09 DIAGNOSIS — D3A8 Other benign neuroendocrine tumors: Secondary | ICD-10-CM | POA: Insufficient documentation

## 2018-02-09 DIAGNOSIS — Z95828 Presence of other vascular implants and grafts: Secondary | ICD-10-CM | POA: Diagnosis not present

## 2018-02-09 DIAGNOSIS — C541 Malignant neoplasm of endometrium: Secondary | ICD-10-CM

## 2018-02-09 DIAGNOSIS — R197 Diarrhea, unspecified: Secondary | ICD-10-CM | POA: Insufficient documentation

## 2018-02-09 DIAGNOSIS — Z885 Allergy status to narcotic agent status: Secondary | ICD-10-CM | POA: Insufficient documentation

## 2018-02-09 DIAGNOSIS — Z882 Allergy status to sulfonamides status: Secondary | ICD-10-CM | POA: Insufficient documentation

## 2018-02-09 DIAGNOSIS — Z9221 Personal history of antineoplastic chemotherapy: Secondary | ICD-10-CM | POA: Diagnosis not present

## 2018-02-09 DIAGNOSIS — M2550 Pain in unspecified joint: Secondary | ICD-10-CM | POA: Insufficient documentation

## 2018-02-09 DIAGNOSIS — Z510 Encounter for antineoplastic radiation therapy: Secondary | ICD-10-CM | POA: Insufficient documentation

## 2018-02-09 NOTE — Progress Notes (Signed)
  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    Simple treatment device note: Patient had construction of her custom vaginal cylinder. She will be treated with a 3.0 cm diameter segmented cylinder. This conforms to her anatomy without undue discomfort.  Vaginal brachytherapy procedure node: The patient was brought to the [facility]. Identity was confirmed. All relevant records and images related to the planned course of therapy were reviewed. The patient freely provided informed written consent to proceed with treatment after reviewing the details related to the planned course of therapy. The consent form was witnessed and verified by the simulation staff. Then, the patient was set-up in a stable reproducible supine position for radiation therapy. Pelvic exam revealed the vaginal cuff to be intact.. The patient's custom vaginal cylinder was placed in the proximal vagina. This was affixed to the CT/MR stabilization plate to prevent slippage. Patient tolerated the placement well.  Verification simulation note:  A fiducial marker was placed within the vaginal cylinder. An AP and lateral film was then obtained through the pelvis area. This documented accurate position of the vaginal cylinder for treatment.  HDR BRACHYTHERAPY TREATMENT  The remote afterloading device was affixed to the vaginal cylinder by catheter. Patient then proceeded to undergo her first high-dose-rate treatment directed at the proximal vagina. The patient was prescribed a dose of 6.0 gray to be delivered to the mucosal surface. Treatment length was 3.0 cm. Patient was treated with 1 channel using 7 dwell positions.  Treatment time was 192.5 seconds. Iridium 192 was the high-dose-rate source for treatment. The patient tolerated the treatment well. After completion of her therapy, a radiation survey was performed documenting return of the iridium source into the Gamma[employer] safe.   PLAN: patient will return next week for her second high-dose-rate treatment ________________________________  Blair Promise, PhD, MD

## 2018-02-09 NOTE — Progress Notes (Signed)
Radiation Oncology         [phone] ________________________________  Name: Connie Simmons MRN: [id number]  Date: 02/09/2018  DOB: 11/04/1945  Vaginal Brachytherapy Procedure Note  CC: Kelton Pillar, MD Heath Lark, MD    ICD-10-CM   1. 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 Patient has finished two cycles of chemotherapy. Pt states that when she receives the neulasta injection she feels a lot of joint pains Pt reports that she occasionally has diarrhea which she attributes to the chemotherapy and occasional constipation. Patient presents today with her sister. Pt denies vaginal bleeding, rectal bleeding and hematuria.  ALLERGIES: is allergic to fish oil; simvastatin; codeine; and sulfa antibiotics.  Meds: Current Outpatient Medications  Medication Sig Dispense Refill  . acetaminophen (TYLENOL) 500 MG tablet Take 1,000 mg by mouth 2 (two) times daily as needed for moderate pain or headache.    [place] Kitchen alum & mag hydroxide-simeth (MAALOX/MYLANTA) 200-200-20 MG/5ML suspension Take by mouth every 6 (six) hours as needed for indigestion or heartburn.    . benazepril-hydrochlorthiazide (LOTENSIN HCT) 20-12.5 MG tablet Take 2 tablets by mouth every morning.   1  . calcium carbonate (TUMS - DOSED IN MG ELEMENTAL CALCIUM) 500 MG chewable tablet Chew 2 tablets by mouth daily.    Mar[place] Kitchen dexamethasone (DECADRON) 4 MG tablet Take 5 tabs the night before and 5 tabs the morning of chemotherapy, every 3 weeks, with food 60 tablet 0  . fluticasone (FLONASE) 50 MCG/ACT nasal spray Place 1 spray into both nostrils daily as needed for allergies or rhinitis.    Mar[place] Kitchen ibuprofen (ADVIL,MOTRIN) 200 MG tablet Take 400-600 mg by mouth 2 (two) times daily as needed for moderate pain.     Marland Kitchen lidocaine-prilocaine (EMLA)  cream Apply to affected area once 30 g 3  . loratadine (CLARITIN) 10 MG tablet Take 10 mg by mouth daily.    . Multiple Vitamin (MULTIVITAMIN WITH MINERALS) TABS tablet Take 1 tablet by mouth daily.    . ondansetron (ZOFRAN) 8 MG tablet Take 1 tablet (8 mg total) by mouth every 8 (eight) hours as needed for refractory nausea / vomiting. Start on day 3 after chemo. (Patient not taking: Reported on 01/09/2018) 30 tablet 1  . prochlorperazine (COMPAZINE) 10 MG tablet Take 1 tablet (10 mg total) by mouth every 6 (six) hours as needed (Nausea or vomiting). (Patient not taking: Reported on 01/09/2018) 30 tablet 1  . traMADol (ULTRAM) 50 MG tablet Take 1 tablet (50 mg total) by mouth every 6 (six) hours as needed. 30 tablet 0   No current facility-administered medications for this encounter.     Physical Findings: The patient is in no acute distress. Patient is alert and oriented.  vitals were not taken for this visit.   No palpable cervical, supraclavicular or axillary lymphoadenopathy. The heart has a regular rate and rhythm. The lungs are clear to auscultation. Abdomen soft and non-tender.  On pelvic examination the external genitalia were unremarkable. A speculum exam was performed. Vaginal cuff intact, no mucosal lesions. On bimanual exam there were no pelvic masses appreciated.   Lab Findings: Lab Results  Component Value Date   WBC 9.8 01/23/2018   HGB 10.7 (L) 01/23/2018   HCT 32.5 (L) 01/23/2018   MCV 82.9 01/23/2018   PLT 381 01/23/2018  Radiographic Findings: Ir Cv Line Injection  Result Date: 01/26/2018 INDICATION: 72 year old female with a right IJ single-lumen power injectable portacatheter which was placed by interventional radiology (Dr. Pascal Lux) on 12/29/2017. At her most recent chemotherapy infusion, she had pain in the region of the port catheter reservoir at the end of her chemotherapy infusion. She presents today for port catheter evaluation and injection. EXAM: CENTRAL  VENOUS CATHETER MEDICATIONS: None ANESTHESIA/SEDATION: None FLUOROSCOPY TIME:  Fluoroscopy Time: 0 minutes 6 seconds (24 mGy). COMPLICATIONS: None immediate. PROCEDURE: Informed written consent was obtained from the patient after a thorough discussion of the procedural risks, benefits and alternatives. All questions were addressed. A timeout was performed prior to the initiation of the procedure. The port catheter was accessed sterilely in the usual fashion. The catheter was evaluated fluoroscopically. This is a right IJ approach single-lumen power injectable port catheter. A catheter injection was performed under fluoroscopy. There is excellent filling of the vessel reservoir. No evidence of thrombus. The catheter tip is well positioned at the superior cavoatrial junction. There is no evidence of fibrin sheath, contrast extravasation or other abnormality. IMPRESSION: Well-positioned and normally functioning right IJ port catheter without evidence of complication. Signed, Criselda Peaches, MD [facility] Electronically Signed   By: Jacqulynn Cadet M.D.   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 The patient will be treated with a 3 cm diameter cylinder with a treatment length of 3 cm. This distended the vaginal vault without undue discomfort. The patient tolerated the procedure well.  The patient was successfully fitted for a vaginal cylinder. The patient is appropriate to begin vaginal brachytherapy.   Plan: The patient will proceed with CT simulation and vaginal brachytherapy today.    _______________________________   Blair Promise, PhD, MD  This document serves as a record of services personally performed by Gery Pray, MD. It was created on his behalf by  Valeta Harms, a trained medical scribe. The creation of this record is based on the scribe's personal observations and the provider's statements to them. This document has been checked and approved by the attending provider.

## 2018-02-09 NOTE — Progress Notes (Signed)
  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  NARRATIVE:  The patient was brought to the [facility].  Identity was confirmed.  All relevant records and images related to the planned course of therapy were reviewed.  The patient freely provided informed written consent to proceed with treatment after reviewing the details related to the planned course of therapy. The consent form was witnessed and verified by the simulation staff.  Then, the patient was set-up in a stable reproducible  supine position for radiation therapy.  CT images were obtained.  Surface markings were placed.  The CT images were loaded into the planning software.  Then the target and avoidance structures were contoured.  Treatment planning then occurred.  The radiation prescription was entered and confirmed.   I have requested : Brachytherapy Isodose Plan and Dosimetry Calculations to plan the radiation distribution.    PLAN:  The patient will receive 30 Gy in 5 fractions directed at the vaginal cuff. Iridium 192 will be the high dose rate source.   ________________________________  Blair Promise, PhD, MD   This document serves as a record of services personally performed by Gery Pray, MD. It was created on his behalf by Valeta Harms, a trained medical scribe. The creation of this record is based on the scribe's personal observations and the provider's statements to them. This document has been checked and approved by the attending provider.

## 2018-02-09 NOTE — Progress Notes (Signed)
Connie Simmons is here for a follow -up appointment. Patient denies any pain .States that she has mild fatigue. Denies any dysuria. Denies any vaginal or rectal bleeding. Denies any vaginal discharge. Denies any nausea or vomiting.States that her appetitie is ok. Patient is currently taking decadron. Patient takes chemotherapy on Monday 's every three weeks. Vitals:   02/09/18 0814  BP: 113/73  Pulse: 75  Resp: 18  Temp: 97.8 F (36.6 C)  TempSrc: Oral  SpO2: 100%  Weight: 224 lb (101.6 kg)   Wt Readings from Last 3 Encounters:  02/09/18 224 lb (101.6 kg)  01/23/18 224 lb 11.2 oz (101.9 kg)  01/09/18 217 lb 6.4 oz (98.6 kg)

## 2018-02-10 ENCOUNTER — Telehealth: Payer: Self-pay | Admitting: Radiation Oncology

## 2018-02-10 NOTE — Telephone Encounter (Signed)
Successfully faxed FMLA paperwork to [facility] at [phone]. Mailed copy to patient address on file.

## 2018-02-13 ENCOUNTER — Inpatient Hospital Stay: Payer: No Typology Code available for payment source

## 2018-02-13 ENCOUNTER — Encounter: Payer: Self-pay | Admitting: Hematology and Oncology

## 2018-02-13 ENCOUNTER — Inpatient Hospital Stay (HOSPITAL_BASED_OUTPATIENT_CLINIC_OR_DEPARTMENT_OTHER): Payer: No Typology Code available for payment source | Admitting: Hematology and Oncology

## 2018-02-13 DIAGNOSIS — C7A8 Other malignant neuroendocrine tumors: Secondary | ICD-10-CM

## 2018-02-13 DIAGNOSIS — R42 Dizziness and giddiness: Secondary | ICD-10-CM

## 2018-02-13 DIAGNOSIS — T451X5A Adverse effect of antineoplastic and immunosuppressive drugs, initial encounter: Secondary | ICD-10-CM

## 2018-02-13 DIAGNOSIS — Z5111 Encounter for antineoplastic chemotherapy: Secondary | ICD-10-CM | POA: Diagnosis not present

## 2018-02-13 DIAGNOSIS — C786 Secondary malignant neoplasm of retroperitoneum and peritoneum: Secondary | ICD-10-CM

## 2018-02-13 DIAGNOSIS — D6481 Anemia due to antineoplastic chemotherapy: Secondary | ICD-10-CM

## 2018-02-13 DIAGNOSIS — Z95828 Presence of other vascular implants and grafts: Secondary | ICD-10-CM

## 2018-02-13 DIAGNOSIS — D61818 Other pancytopenia: Secondary | ICD-10-CM

## 2018-02-13 DIAGNOSIS — C541 Malignant neoplasm of endometrium: Secondary | ICD-10-CM

## 2018-02-13 DIAGNOSIS — G620 Drug-induced polyneuropathy: Secondary | ICD-10-CM

## 2018-02-13 DIAGNOSIS — C801 Malignant (primary) neoplasm, unspecified: Secondary | ICD-10-CM

## 2018-02-13 LAB — CMP (CANCER CENTER ONLY)
ALT: 25 U/L (ref 0–55)
AST: 16 U/L (ref 5–34)
Albumin: 3.9 g/dL (ref 3.5–5.0)
Alkaline Phosphatase: 100 U/L (ref 40–150)
Anion gap: 12 — ABNORMAL HIGH (ref 3–11)
BUN: 25 mg/dL (ref 7–26)
CO2: 22 mmol/L (ref 22–29)
Calcium: 9.5 mg/dL (ref 8.4–10.4)
Chloride: 102 mmol/L (ref 98–109)
Creatinine: 1 mg/dL (ref 0.60–1.10)
GFR, Est AFR Am: >60 mL/min (ref >60)
GFR, Estimated: 56 mL/min — ABNORMAL LOW (ref >60)
Glucose, Bld: 128 mg/dL (ref 70–140)
Potassium: 3.8 mmol/L (ref 3.5–5.1)
Sodium: 136 mmol/L (ref 136–145)
Total Bilirubin: 0.3 mg/dL (ref 0.2–1.2)
Total Protein: 7.1 g/dL (ref 6.4–8.3)

## 2018-02-13 LAB — CBC WITH DIFFERENTIAL (CANCER CENTER ONLY)
Basophils Absolute: 0 10*3/uL (ref 0.0–0.1)
Basophils Relative: 0 %
Eosinophils Absolute: 0 10*3/uL (ref 0.0–0.5)
Eosinophils Relative: 0 %
HCT: 27.8 % — ABNORMAL LOW (ref 34.8–46.6)
Hemoglobin: 9.6 g/dL — ABNORMAL LOW (ref 11.6–15.9)
Lymphocytes Relative: 7 %
Lymphs Abs: 0.5 10*3/uL — ABNORMAL LOW (ref 0.9–3.3)
MCH: 28.1 pg (ref 25.1–34.0)
MCHC: 34.5 g/dL (ref 31.5–36.0)
MCV: 81.5 fL (ref 79.5–101.0)
Monocytes Absolute: 0.1 10*3/uL (ref 0.1–0.9)
Monocytes Relative: 1 %
Neutro Abs: 6.7 10*3/uL — ABNORMAL HIGH (ref 1.5–6.5)
Neutrophils Relative %: 92 %
Platelet Count: 67 10*3/uL — ABNORMAL LOW (ref 145–400)
RBC: 3.41 MIL/uL — ABNORMAL LOW (ref 3.70–5.45)
RDW: 15 % — ABNORMAL HIGH (ref 11.2–14.5)
WBC Count: 7.3 10*3/uL (ref 3.9–10.3)

## 2018-02-13 MED ORDER — HEPARIN SOD (PORK) LOCK FLUSH 100 UNIT/ML IV SOLN
500.0000 [IU] | Freq: Once | INTRAVENOUS | Status: AC
  Administered 2018-02-13: 500 [IU] via INTRAVENOUS
  Filled 2018-02-13: qty 5

## 2018-02-13 MED ORDER — TRAMADOL HCL 50 MG PO TABS: 50.0000 mg | ORAL_TABLET | Freq: Four times a day (QID) | ORAL | 0 refills | Status: DC | PRN

## 2018-02-13 MED ORDER — SODIUM CHLORIDE 0.9% FLUSH
10.0000 mL | INTRAVENOUS | Status: DC | PRN
  Administered 2018-02-13: 10 mL via INTRAVENOUS
  Filled 2018-02-13: qty 10

## 2018-02-13 MED ORDER — SODIUM CHLORIDE 0.9% FLUSH
10.0000 mL | Freq: Once | INTRAVENOUS | Status: AC
  Administered 2018-02-13: 10 mL
  Filled 2018-02-13: qty 10

## 2018-02-13 NOTE — Progress Notes (Signed)
Patient walked in to discuss available resources.  Introduced myself as Arboriculturist. Asked patient if she has met her ded/OOP. Patient states she has. Advised patient of the copay assistance program for [employer] if she was going to need it. Patient is certain she has met everything. Discussed the one-time $400 Network engineer. Patient interested in applying. She will bring proof of income on Fri 02/17/18 due to being unable to do treatment today.

## 2018-02-14 ENCOUNTER — Telehealth: Payer: Self-pay | Admitting: *Deleted

## 2018-02-14 ENCOUNTER — Telehealth: Payer: Self-pay | Admitting: Hematology and Oncology

## 2018-02-14 ENCOUNTER — Encounter: Payer: Self-pay | Admitting: Hematology and Oncology

## 2018-02-14 DIAGNOSIS — D61818 Other pancytopenia: Secondary | ICD-10-CM | POA: Insufficient documentation

## 2018-02-14 DIAGNOSIS — R42 Dizziness and giddiness: Secondary | ICD-10-CM | POA: Insufficient documentation

## 2018-02-14 NOTE — Assessment & Plan Note (Signed)
She has profound symptoms of dizziness, likely due to drug-induced hypotension Her blood pressure is noted to be borderline I recommend reducing the dose of her blood pressure medications and follow closely with the primary care doctor

## 2018-02-14 NOTE — Assessment & Plan Note (Signed)
She has profound pancytopenia from treatment I will reschedule her chemotherapy and reduce the dose of treatment as above

## 2018-02-14 NOTE — Assessment & Plan Note (Signed)
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

## 2018-02-14 NOTE — Telephone Encounter (Signed)
Spoke to patient regarding upcoming May appointments.

## 2018-02-14 NOTE — Telephone Encounter (Signed)
CALLED PATIENT TO REMIND OF HDR [place]. FOR 02-15-18 @ 11 AM, SPOKE WITH PATIENT AND SHE IS AWARE OF THIS Connie Simmons.

## 2018-02-14 NOTE — Assessment & Plan Note (Signed)
The patient has developed profound pancytopenia and neuropathy from treatment Her calculated dose came back quite high due to her being overweight I recommend further reduction of dose of Taxol and reduce carboplatin by 20% We will reschedule her chemotherapy to be given the end of the week I reminded the patient by delaying the treatment and reducing the dose will not compromise efficacy

## 2018-02-14 NOTE — Progress Notes (Signed)
Connie Simmons OFFICE PROGRESS NOTE  Patient Care Team: Kelton Pillar, MD as PCP - General (Family Medicine)  ASSESSMENT & PLAN:  Endometrial cancer [facility]) The patient has developed profound pancytopenia and neuropathy from treatment Her calculated dose came back quite high due to her being overweight I recommend further reduction of dose of Taxol and reduce carboplatin by 20% We will reschedule her chemotherapy to be given the end of the week I reminded the patient by delaying the treatment and reducing the dose will not compromise efficacy   Peripheral neuropathy due to chemotherapy [facility]) She has profound neuropathy from treatment I recommend further dose adjustment as above  Pancytopenia, acquired ([place]) She has profound pancytopenia from treatment I will reschedule her chemotherapy and reduce the dose of treatment as above  Dizziness She has profound symptoms of dizziness, likely due to drug-induced hypotension Her blood pressure is noted to be borderline I recommend reducing the dose of her blood pressure medications and follow closely with the primary care doctor   No orders of the defined types were placed in this encounter.   INTERVAL HISTORY: Please see below for problem oriented charting. She returns for cycle 3 of chemotherapy She has been complaining of burning sensation and numbness caused by chemotherapy She also had recent dizziness Her appetite is stable Denies significant nausea, vomiting or constipation The patient denies any recent signs or symptoms of bleeding such as spontaneous epistaxis, hematuria or hematochezia.   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 - SEE COMMENT. Microscopic  Comment Sections are of a high grade carcinoma with mixed glandular, papillary and solid patterns. The histomorphology and immunostain pattern of ER negative, PR negative, p53 positive and p16 positive is compatible with a serous carcinoma. Internal departmental review obtained (Dr. Lyndon Code) with agreement. The results were called to Dr. Nelda Marseille on on 11/14/17. (MEG:kh 11/14/17)      11/23/2017 Imaging    1. Grossly thickened and heterogeneous endometrium consistent with known endometrial carcinoma. No CT findings to suggest serosal or extra uterine direct extension. No pelvic adenopathy.  2. There are worrisome small peritoneal nodules and a small mesenteric nodal mass in the root of the small bowel mesentery suspicious for metastatic disease. 3. No metastatic pulmonary nodules at the lung bases or evidence of solid abdominal organ metastatic disease.       12/01/2017 Pathology Results    1. Lymph node, sentinel, biopsy, right obturator - ONE BENIGN LYMPH NODE (0/1). 2. Peritoneum, biopsy, peritoneal nodule - NEUROENDOCRINE TUMOR. - SEE COMMENT. 3. Lymph node, sentinel, biopsy, left obturator - SEVEN BENIGN LYMPH NODES (0/7). 4. Uterus +/- tubes/ovaries, neoplastic ENDOMETRIUM. - HIGH GRADE SEROUS CARCINOMA, 3.7 CM. - CARCINOMA FOCALLY INVOLVES OUTER HALF OF MYOMETRIUM. - CERVIX, RIGHT OVARY AND BILATERAL FALLOPIAN TUBES FREE OF TUMOR. LEFT OVARY: - NEUROENDOCRINE TUMOR. - SEE COMMENT. Microscopic Comment 4. ONCOLOGY TABLE-UTERUS, CARCINOMA OR CARCINOSARCOMA Specimen: Uterus with bilateral ovaries and fallopian tubes with right and left obturator sentinel lymph nodes and peritoneal nodule biopsy. Procedure: Hysterectomy with bilateral salpingo-oophorectomy with right and left obturator lymph node biopsies. Lymph node sampling performed: Yes. Specimen integrity: Intact with cervix and body separated. Maximum tumor size: 3.7 cm Histologic type: High grade serous carcinoma. Grade:  III. Myometrial invasion: 1.2 cm  where myometrium is 2 cm in thickness. Cervical stromal involvement: No. Extent of involvement of other organs: High grade serous carcinoma involving endometrium and myometrium. Lymph - vascular invasion: No  Peritoneal washings: Pending. Lymph nodes: Examined: 7 Sentinel 0 Non-sentinel 7 Total Lymph nodes with metastasis: 0 Isolated tumor cells (< 0.2 mm): 0 Micrometastasis: (> 0.2 mm and < 2.0 mm): 0 Macrometastasis: (> 2.0 mm): 0 Extracapsular extension: N/A Pelvic lymph nodes: 0 involved of 8 lymph nodes. Para-aortic lymph nodes: 0 involved of 0 lymph nodes. Other (specify involvement and site): N/A TNM code: pT1b, pN0 FIGO Stage (based on pathologic findings, needs clinical correlation): IB. COMMENT: The endometrial tumor is 3.7 cm in greatest dimension and is a high grade serous carcinoma which focally invades the outer half of the myometrium.  The left ovary shows a 1 cm nodule of neoplasm characterized by sheets of cells with uniform, round to oval nuclei with rare mitotic figures. Iimmunohistochemistry is performed and the left ovarian tumor is positive with cytokeratin AE1/AE3, CD56, chromogranin, synaptophysin and CDX-2. The tumor is negative with inhibin, WT-1, calretinin, CD99, cytokerain 20, cytokeratin 7, estrogen receptor, progesterone receptor, vimentin, and p53. The morphology and immunophenotype are consistent with a low to intermediate grade neuroendocrine tumor and CDX-2 positivity suggests possible gastrointestinal origin. There is a similar 0.6 cm nodule in the paraovarian connective tissue and the peritoneal nodule (part 2) has identical features.       12/21/2017 Imaging    Prior hysterectomy. No evidence of residual or metastatic carcinoma within the abdomen or pelvis.  Tiny indeterminate bilateral upper lobe pulmonary nodules, likely postinflammatory in etiology although metastatic disease cannot definitely be excluded. Recommend  continued follow-up by chest CT in 6 months.  Dominant 2.4 cm left thyroid lobe nodule. Consider thyroid ultrasound for further evaluation.       12/29/2017 Procedure    Successful placement of a right internal jugular approach power injectable Port-A-Cath. The catheter is ready for immediate use.      01/26/2018 Procedure    Well-positioned and normally functioning right IJ port catheter without evidence of complication.       REVIEW OF SYSTEMS:   Constitutional: Denies fevers, chills or abnormal weight loss Eyes: Denies blurriness of vision Ears, nose, mouth, throat, and face: Denies mucositis or sore throat Respiratory: Denies cough, dyspnea or wheezes Cardiovascular: Denies palpitation, chest discomfort or lower extremity swelling Gastrointestinal:  Denies nausea, heartburn or change in bowel habits Skin: Denies abnormal skin rashes Lymphatics: Denies new lymphadenopathy or easy bruising Neurological:Denies numbness, tingling or new weaknesses Behavioral/Psych: Mood is stable, no new changes  All other systems were reviewed with the patient and are negative.  I have reviewed the past medical history, past surgical history, social history and family history with the patient and they are unchanged from previous note.  ALLERGIES:  is allergic to fish oil; simvastatin; codeine; and sulfa antibiotics.  MEDICATIONS:  Current Outpatient Medications  Medication Sig Dispense Refill  . acetaminophen (TYLENOL) 500 MG tablet Take 1,000 mg by mouth 2 (two) times daily as needed for moderate pain or headache.    Marland Kitchen alum & mag hydroxide-simeth (MAALOX/MYLANTA) 200-200-20 MG/5ML suspension Take by mouth every 6 (six) hours as needed for indigestion or heartburn.    . benazepril-hydrochlorthiazide (LOTENSIN HCT) 20-12.5 MG tablet Take 1 tablet by mouth every morning.  1  . calcium carbonate (TUMS - DOSED IN MG ELEMENTAL CALCIUM) 500 MG chewable tablet Chew 2 tablets by mouth daily.    Marland Kitchen  dexamethasone (DECADRON) 4 MG tablet Take 5 tabs the night before and 5 tabs the morning of chemotherapy, every 3 weeks, with food (Patient taking differently: Take 4 mg by mouth 2 (two) times daily. Take 3 tabs the night before and 3 tabs the morning of chemotherapy, every 3 weeks, with food) 60 tablet 0  . fluticasone (FLONASE) 50 MCG/ACT nasal spray Place 1 spray into both nostrils daily as needed for allergies or rhinitis.    Marland Kitchen ibuprofen (ADVIL,MOTRIN) 200 MG tablet Take 400-600 mg by mouth 2 (two) times daily as needed for moderate pain.     Marland Kitchen lidocaine-prilocaine (EMLA) cream Apply to affected area once 30 g 3  . loratadine (CLARITIN) 10 MG tablet Take 10 mg by mouth daily.    . Multiple Vitamin (MULTIVITAMIN WITH MINERALS) TABS tablet Take 1 tablet by mouth daily.    . ondansetron (ZOFRAN) 8 MG tablet Take 1 tablet (8 mg total) by mouth every 8 (eight) hours as needed for refractory nausea / vomiting. Start on day 3 after chemo. (Patient not taking: Reported on 02/09/2018) 30 tablet 1  . prochlorperazine (COMPAZINE) 10 MG tablet Take 1 tablet (10 mg total) by mouth every 6 (six) hours as needed (Nausea or vomiting). (Patient not taking: Reported on 01/09/2018) 30 tablet 1  . traMADol (ULTRAM) 50 MG tablet Take 1 tablet (50 mg total) by mouth every 6 (six) hours as needed. 60 tablet 0   Current Facility-Administered Medications  Medication Dose Route Frequency Provider Last Rate Last Dose  . sodium chloride flush (NS) 0.9 % injection 10 mL  10 mL Intravenous PRN Alvy Bimler, Jeremia Groot, MD   10 mL at 02/13/18 0842    PHYSICAL EXAMINATION: ECOG PERFORMANCE STATUS: 1 - Symptomatic but completely ambulatory  Vitals:   02/13/18 0817  BP: 112/65  Pulse: 75  Resp: 18  Temp: 97.8 F (36.6 C)  SpO2: 100%   Filed Weights   02/13/18 0817  Weight: 224 lb 8 oz (101.8 kg)    GENERAL:alert, no distress and comfortable SKIN: skin color, texture, turgor are normal, no rashes or significant lesions EYES:  normal, Conjunctiva are pink and non-injected, sclera clear OROPHARYNX:no exudate, no erythema and lips, buccal mucosa, and tongue normal  NECK: supple, thyroid normal size, non-tender, without nodularity LYMPH:  no palpable lymphadenopathy in the cervical, axillary or inguinal LUNGS: clear to auscultation and percussion with normal breathing effort HEART: regular rate & rhythm and no murmurs and no lower extremity edema ABDOMEN:abdomen soft, non-tender and normal bowel sounds Musculoskeletal:no cyanosis of digits and no clubbing  NEURO: alert & oriented x 3 with fluent speech, no focal motor/sensory deficits  LABORATORY DATA:  I have reviewed the data as listed    Component Value Date/Time   NA 136 02/13/2018 0750   K 3.8 02/13/2018 0750   CL 102 02/13/2018 0750   CO2 22 02/13/2018 0750   GLUCOSE 128 02/13/2018 0750   BUN 25 02/13/2018 0750   CREATININE 1.00 02/13/2018 0750   CALCIUM 9.5 02/13/2018 0750   PROT 7.1 02/13/2018 0750   ALBUMIN 3.9 02/13/2018 0750   AST 16 02/13/2018 0750   ALT 25 02/13/2018 0750   ALKPHOS 100 02/13/2018 0750   BILITOT 0.3 02/13/2018 0750   GFRNONAA 56 (L) 02/13/2018 0750   GFRAA >60 02/13/2018 0750    No results found for: SPEP, UPEP  Lab Results  Component Value Date   WBC 7.3 02/13/2018   NEUTROABS 6.7 (H) 02/13/2018   HGB 9.6 (L) 02/13/2018  HCT 27.8 (L) 02/13/2018   MCV 81.5 02/13/2018   PLT 67 (L) 02/13/2018      Chemistry      Component Value Date/Time   NA 136 02/13/2018 0750   K 3.8 02/13/2018 0750   CL 102 02/13/2018 0750   CO2 22 02/13/2018 0750   BUN 25 02/13/2018 0750   CREATININE 1.00 02/13/2018 0750      Component Value Date/Time   CALCIUM 9.5 02/13/2018 0750   ALKPHOS 100 02/13/2018 0750   AST 16 02/13/2018 0750   ALT 25 02/13/2018 0750   BILITOT 0.3 02/13/2018 0750       RADIOGRAPHIC STUDIES: I have personally reviewed the radiological images as listed and agreed with the findings in the report. Ir Cv  Line Injection  Result Date: 01/26/2018 INDICATION: 72 year old female with a right IJ single-lumen power injectable portacatheter which was placed by interventional radiology (Dr. Pascal Lux) on 12/29/2017. At her most recent chemotherapy infusion, she had pain in the region of the port catheter reservoir at the end of her chemotherapy infusion. She presents today for port catheter evaluation and injection. EXAM: CENTRAL VENOUS CATHETER MEDICATIONS: None ANESTHESIA/SEDATION: None FLUOROSCOPY TIME:  Fluoroscopy Time: 0 minutes 6 seconds (24 mGy). COMPLICATIONS: None immediate. PROCEDURE: Informed written consent was obtained from the patient after a thorough discussion of the procedural risks, benefits and alternatives. All questions were addressed. A timeout was performed prior to the initiation of the procedure. The port catheter was accessed sterilely in the usual fashion. The catheter was evaluated fluoroscopically. This is a right IJ approach single-lumen power injectable port catheter. A catheter injection was performed under fluoroscopy. There is excellent filling of the vessel reservoir. No evidence of thrombus. The catheter tip is well positioned at the superior cavoatrial junction. There is no evidence of fibrin sheath, contrast extravasation or other abnormality. IMPRESSION: Well-positioned and normally functioning right IJ port catheter without evidence of complication. Signed, Criselda Peaches, MD [facility] Electronically Signed   By: Jacqulynn Cadet M.D.   On: 01/26/2018 08:55    All questions were answered. The patient knows to call the clinic with any problems, questions or concerns. No barriers to learning was detected.  I spent 25 minutes counseling the patient face to face. The total time spent in the appointment was 40 minutes and more than 50% was on counseling and review of test results  Heath Lark, MD 02/14/2018 11:24 AM

## 2018-02-15 ENCOUNTER — Ambulatory Visit
Admission: RE | Admit: 2018-02-15 | Discharge: 2018-02-15 | Disposition: A | Discharge status: Home / Self Care | Payer: No Typology Code available for payment source | Source: Ambulatory Visit | Attending: Radiation Oncology | Admitting: Radiation Oncology

## 2018-02-15 DIAGNOSIS — C541 Malignant neoplasm of endometrium: Secondary | ICD-10-CM | POA: Diagnosis not present

## 2018-02-15 DIAGNOSIS — Z510 Encounter for antineoplastic radiation therapy: Secondary | ICD-10-CM | POA: Diagnosis not present

## 2018-02-15 NOTE — Progress Notes (Signed)
  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     Simple treatment device note: Patient had construction of her custom vaginal cylinder. She will be treated with a 3.0 cm diameter segmented cylinder. This conforms to her anatomy without undue discomfort.  Vaginal brachytherapy procedure node: The patient was brought to the [place] suite. Identity was confirmed. All relevant records and images related to the planned course of therapy were reviewed. The patient freely provided informed written consent to proceed with treatment after reviewing the details related to the planned course of therapy. The consent form was witnessed and verified by the simulation staff. Then, the patient was set-up in a stable reproducible supine position for radiation therapy. Pelvic exam revealed the vaginal cuff to be intact . The patient's custom vaginal cylinder was placed in the proximal vagina. This was affixed to the CT/MR stabilization plate to prevent slippage. Patient tolerated the placement well.  Verification simulation note:  A fiducial marker was placed within the vaginal cylinder. An AP and lateral film was then obtained through the pelvis area. This documented accurate position of the vaginal cylinder for treatment.  HDR BRACHYTHERAPY TREATMENT  The remote afterloading device was affixed to the vaginal cylinder by catheter. Patient then proceeded to undergo her second high-dose-rate treatment directed at the proximal vagina. The patient was prescribed a dose of 6.0 gray to be delivered to the mucosal surface. Treatment length was 3.0 cm. Patient was treated with 1 channel using 7 dwell positions.  Treatment time was 203.7 seconds. Iridium 192 was the high-dose-rate source for treatment. The patient tolerated the treatment well. After completion of her therapy, a radiation survey was performed documenting return of the iridium source into the [employer] safe.   PLAN: the patient will return next week for her third high-dose-rate treatment ________________________________  Blair Promise, PhD, MD

## 2018-02-16 ENCOUNTER — Ambulatory Visit: Payer: No Typology Code available for payment source | Admitting: Radiation Oncology

## 2018-02-17 ENCOUNTER — Inpatient Hospital Stay: Payer: No Typology Code available for payment source | Attending: Gynecologic Oncology

## 2018-02-17 ENCOUNTER — Inpatient Hospital Stay: Payer: No Typology Code available for payment source

## 2018-02-17 VITALS — BP 123/71 | HR 73 | Temp 98.4°F | Resp 17

## 2018-02-17 DIAGNOSIS — C7A8 Other malignant neuroendocrine tumors: Secondary | ICD-10-CM | POA: Diagnosis not present

## 2018-02-17 DIAGNOSIS — Z95828 Presence of other vascular implants and grafts: Secondary | ICD-10-CM

## 2018-02-17 DIAGNOSIS — E041 Nontoxic single thyroid nodule: Secondary | ICD-10-CM | POA: Insufficient documentation

## 2018-02-17 DIAGNOSIS — C541 Malignant neoplasm of endometrium: Secondary | ICD-10-CM | POA: Diagnosis not present

## 2018-02-17 DIAGNOSIS — G620 Drug-induced polyneuropathy: Secondary | ICD-10-CM | POA: Insufficient documentation

## 2018-02-17 DIAGNOSIS — Z5111 Encounter for antineoplastic chemotherapy: Secondary | ICD-10-CM | POA: Insufficient documentation

## 2018-02-17 DIAGNOSIS — I808 Phlebitis and thrombophlebitis of other sites: Secondary | ICD-10-CM | POA: Diagnosis not present

## 2018-02-17 DIAGNOSIS — Z5189 Encounter for other specified aftercare: Secondary | ICD-10-CM | POA: Insufficient documentation

## 2018-02-17 LAB — CBC WITH DIFFERENTIAL (CANCER CENTER ONLY)
Basophils Absolute: 0 10*3/uL (ref 0.0–0.1)
Basophils Relative: 0 %
Eosinophils Absolute: 0 10*3/uL (ref 0.0–0.5)
Eosinophils Relative: 0 %
HCT: 29.5 % — ABNORMAL LOW (ref 34.8–46.6)
Hemoglobin: 10.1 g/dL — ABNORMAL LOW (ref 11.6–15.9)
Lymphocytes Relative: 6 %
Lymphs Abs: 0.5 10*3/uL — ABNORMAL LOW (ref 0.9–3.3)
MCH: 28.3 pg (ref 25.1–34.0)
MCHC: 34.2 g/dL (ref 31.5–36.0)
MCV: 82.6 fL (ref 79.5–101.0)
Monocytes Absolute: 0.1 10*3/uL (ref 0.1–0.9)
Monocytes Relative: 1 %
Neutro Abs: 8.5 10*3/uL — ABNORMAL HIGH (ref 1.5–6.5)
Neutrophils Relative %: 93 %
Platelet Count: 143 10*3/uL — ABNORMAL LOW (ref 145–400)
RBC: 3.57 MIL/uL — ABNORMAL LOW (ref 3.70–5.45)
RDW: 17.8 % — ABNORMAL HIGH (ref 11.2–14.5)
WBC Count: 9.1 10*3/uL (ref 3.9–10.3)

## 2018-02-17 LAB — CMP (CANCER CENTER ONLY)
ALT: 14 U/L (ref 0–55)
AST: 13 U/L (ref 5–34)
Albumin: 4.1 g/dL (ref 3.5–5.0)
Alkaline Phosphatase: 98 U/L (ref 40–150)
Anion gap: 10 (ref 3–11)
BUN: 20 mg/dL (ref 7–26)
CO2: 26 mmol/L (ref 22–29)
Calcium: 10.1 mg/dL (ref 8.4–10.4)
Chloride: 101 mmol/L (ref 98–109)
Creatinine: 0.81 mg/dL (ref 0.60–1.10)
GFR, Est AFR Am: >60 mL/min (ref >60)
GFR, Estimated: >60 mL/min (ref >60)
Glucose, Bld: 126 mg/dL (ref 70–140)
Potassium: 4 mmol/L (ref 3.5–5.1)
Sodium: 137 mmol/L (ref 136–145)
Total Bilirubin: 0.4 mg/dL (ref 0.2–1.2)
Total Protein: 7.2 g/dL (ref 6.4–8.3)

## 2018-02-17 MED ORDER — SODIUM CHLORIDE 0.9% FLUSH
10.0000 mL | INTRAVENOUS | Status: DC | PRN
  Administered 2018-02-17: 10 mL
  Filled 2018-02-17: qty 10

## 2018-02-17 MED ORDER — FAMOTIDINE IN NACL 20-0.9 MG/50ML-% IV SOLN
INTRAVENOUS | Status: AC
  Filled 2018-02-17: qty 50

## 2018-02-17 MED ORDER — PEGFILGRASTIM 6 MG/0.6ML ~~LOC~~ PSKT
6.0000 mg | PREFILLED_SYRINGE | Freq: Once | SUBCUTANEOUS | Status: AC
  Administered 2018-02-17: 6 mg via SUBCUTANEOUS

## 2018-02-17 MED ORDER — SODIUM CHLORIDE 0.9% FLUSH
10.0000 mL | Freq: Once | INTRAVENOUS | Status: DC
  Filled 2018-02-17: qty 10

## 2018-02-17 MED ORDER — PEGFILGRASTIM 6 MG/0.6ML ~~LOC~~ PSKT
PREFILLED_SYRINGE | SUBCUTANEOUS | Status: AC
  Filled 2018-02-17: qty 0.6

## 2018-02-17 MED ORDER — PACLITAXEL CHEMO INJECTION 300 MG/50ML
105.0000 mg/m2 | Freq: Once | INTRAVENOUS | Status: AC
  Administered 2018-02-17: 222 mg via INTRAVENOUS
  Filled 2018-02-17: qty 37

## 2018-02-17 MED ORDER — SODIUM CHLORIDE 0.9 % IV SOLN
527.0400 mg | Freq: Once | INTRAVENOUS | Status: AC
  Administered 2018-02-17: 530 mg via INTRAVENOUS
  Filled 2018-02-17: qty 53

## 2018-02-17 MED ORDER — DIPHENHYDRAMINE HCL 50 MG/ML IJ SOLN
50.0000 mg | Freq: Once | INTRAMUSCULAR | Status: AC
  Administered 2018-02-17: 50 mg via INTRAVENOUS

## 2018-02-17 MED ORDER — PALONOSETRON HCL INJECTION 0.25 MG/5ML
0.2500 mg | Freq: Once | INTRAVENOUS | Status: AC
  Administered 2018-02-17: 0.25 mg via INTRAVENOUS

## 2018-02-17 MED ORDER — FAMOTIDINE IN NACL 20-0.9 MG/50ML-% IV SOLN
20.0000 mg | Freq: Once | INTRAVENOUS | Status: AC
  Administered 2018-02-17: 20 mg via INTRAVENOUS

## 2018-02-17 MED ORDER — PALONOSETRON HCL INJECTION 0.25 MG/5ML
INTRAVENOUS | Status: AC
  Filled 2018-02-17: qty 5

## 2018-02-17 MED ORDER — SODIUM CHLORIDE 0.9 % IV SOLN
Freq: Once | INTRAVENOUS | Status: AC
  Administered 2018-02-17: 10:00:00 via INTRAVENOUS

## 2018-02-17 MED ORDER — HEPARIN SOD (PORK) LOCK FLUSH 100 UNIT/ML IV SOLN
500.0000 [IU] | Freq: Once | INTRAVENOUS | Status: AC | PRN
  Administered 2018-02-17: 500 [IU]
  Filled 2018-02-17: qty 5

## 2018-02-17 MED ORDER — SODIUM CHLORIDE 0.9 % IV SOLN
Freq: Once | INTRAVENOUS | Status: AC
  Administered 2018-02-17: 10:00:00 via INTRAVENOUS
  Filled 2018-02-17: qty 5

## 2018-02-17 MED ORDER — DIPHENHYDRAMINE HCL 50 MG/ML IJ SOLN
INTRAMUSCULAR | Status: AC
  Filled 2018-02-17: qty 1

## 2018-02-17 NOTE — Patient Instructions (Signed)
[facility] Discharge Instructions for Patients Receiving Chemotherapy  Today you received the following chemotherapy agents Paclitaxel and Carboplatin  To help prevent nausea and vomiting after your treatment, we encourage you to take your nausea medication. If you develop nausea and vomiting that is not controlled by your nausea medication, call the clinic.   BELOW ARE SYMPTOMS THAT SHOULD BE REPORTED IMMEDIATELY:  *FEVER GREATER THAN 100.5 F  *CHILLS WITH OR WITHOUT FEVER  NAUSEA AND VOMITING THAT IS NOT CONTROLLED WITH YOUR NAUSEA MEDICATION  *UNUSUAL SHORTNESS OF BREATH  *UNUSUAL BRUISING OR BLEEDING  TENDERNESS IN MOUTH AND THROAT WITH OR WITHOUT PRESENCE OF ULCERS  *URINARY PROBLEMS  *BOWEL PROBLEMS  UNUSUAL RASH Items with * indicate a potential emergency and should be followed up as soon as possible.  Feel free to call the clinic should you have any questions or concerns. The clinic phone number is [phone].  Please show the [place] at check-in to the Emergency Department and triage nurse.

## 2018-02-17 NOTE — Patient Instructions (Signed)
Implanted Port Home Guide An implanted port is a type of central line that is placed under the skin. Central lines are used to provide IV access when treatment or nutrition needs to be given through a person's veins. Implanted ports are used for long-term IV access. An implanted port may be placed because:  You need IV medicine that would be irritating to the small veins in your hands or arms.  You need long-term IV medicines, such as antibiotics.  You need IV nutrition for a long period.  You need frequent blood draws for lab tests.  You need dialysis.  Implanted ports are usually placed in the chest area, but they can also be placed in the upper arm, the abdomen, or the leg. An implanted port has two main parts:  Reservoir. The reservoir is round and will appear as a small, raised area under your skin. The reservoir is the part where a needle is inserted to give medicines or draw blood.  Catheter. The catheter is a thin, flexible tube that extends from the reservoir. The catheter is placed into a large vein. Medicine that is inserted into the reservoir goes into the catheter and then into the vein.  How will I care for my incision site? Do not get the incision site wet. Bathe or shower as directed by your health care provider. How is my port accessed? Special steps must be taken to access the port:  Before the port is accessed, a numbing cream can be placed on the skin. This helps numb the skin over the port site.  Your health care provider uses a sterile technique to access the port. ? Your health care provider must put on a mask and sterile gloves. ? The skin over your port is cleaned carefully with an antiseptic and allowed to dry. ? The port is gently pinched between sterile gloves, and a needle is inserted into the port.  Only "non-coring" port needles should be used to access the port. Once the port is accessed, a blood return should be checked. This helps ensure that the port  is in the vein and is not clogged.  If your port needs to remain accessed for a constant infusion, a clear (transparent) bandage will be placed over the needle site. The bandage and needle will need to be changed every week, or as directed by your health care provider.  Keep the bandage covering the needle clean and dry. Do not get it wet. Follow your health care provider's instructions on how to take a shower or bath while the port is accessed.  If your port does not need to stay accessed, no bandage is needed over the port.  What is flushing? Flushing helps keep the port from getting clogged. Follow your health care provider's instructions on how and when to flush the port. Ports are usually flushed with saline solution or a medicine called heparin. The need for flushing will depend on how the port is used.  If the port is used for intermittent medicines or blood draws, the port will need to be flushed: ? After medicines have been given. ? After blood has been drawn. ? As part of routine maintenance.  If a constant infusion is running, the port may not need to be flushed.  How long will my port stay implanted? The port can stay in for as long as your health care provider thinks it is needed. When it is time for the port to come out, surgery will be   done to remove it. The procedure is similar to the one performed when the port was put in. When should I seek immediate medical care? When you have an implanted port, you should seek immediate medical care if:  You notice a bad smell coming from the incision site.  You have swelling, redness, or drainage at the incision site.  You have more swelling or pain at the port site or the surrounding area.  You have a fever that is not controlled with medicine.  This information is not intended to replace advice given to you by your health care provider. Make sure you discuss any questions you have with your health care provider. Document  Released: 10/04/2005 Document Revised: 03/11/2016 Document Reviewed: 06/11/2013 Elsevier Interactive Patient Education  2017 [employer]  

## 2018-02-21 ENCOUNTER — Telehealth: Payer: Self-pay | Admitting: *Deleted

## 2018-02-21 ENCOUNTER — Telehealth: Payer: Self-pay

## 2018-02-21 NOTE — Telephone Encounter (Signed)
Called patient to remind of HDR Tx. for 02-22-18 @ 1 pm, spoke with patient and she is aware of this tx.

## 2018-02-21 NOTE — Telephone Encounter (Signed)
Returned pt's call. She was concerned that since she has developed a hemorrhoid, she would not be able to receive radiation tomorrow. Spoke with Dr. Sondra Come who stated it would not impact her treatment tomorrow. Informed pt, who verbalized understanding and relief. Told her to call back should she have any other questions/concerns.

## 2018-02-22 ENCOUNTER — Other Ambulatory Visit: Payer: Self-pay | Admitting: Radiation Oncology

## 2018-02-22 ENCOUNTER — Ambulatory Visit
Admission: RE | Admit: 2018-02-22 | Discharge: 2018-02-22 | Disposition: A | Discharge status: Home / Self Care | Payer: No Typology Code available for payment source | Source: Ambulatory Visit | Attending: Radiation Oncology | Admitting: Radiation Oncology

## 2018-02-22 DIAGNOSIS — C541 Malignant neoplasm of endometrium: Secondary | ICD-10-CM

## 2018-02-22 DIAGNOSIS — Z510 Encounter for antineoplastic radiation therapy: Secondary | ICD-10-CM | POA: Diagnosis not present

## 2018-02-22 MED ORDER — HYDROCORTISONE ACETATE 25 MG RE SUPP: 25.0000 mg | Freq: Two times a day (BID) | RECTAL | 0 refills | Status: DC

## 2018-02-22 NOTE — Progress Notes (Signed)
  Radiation Oncology         [phone] ________________________________  Name: Connie Simmons MRN: [id number]  Date: 02/22/2018  DOB: [birth year]  CC: Kelton Pillar, MD  Heath Lark, MD  HDR BRACHYTHERAPY NOTE  DIAGNOSIS: 72 y.o. 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 She will be treated with a 3.0 cm diameter segmented cylinder. This conforms to her anatomy without undue discomfort.  Vaginal brachytherapy procedure node: The patient was brought to the [place] suite. Identity was confirmed. All relevant records and images related to the planned course of therapy were reviewed. The patient freely provided informed written consent to proceed with treatment after reviewing the details related to the planned course of therapy. The consent form was witnessed and verified by the simulation staff. Then, the patient was set-up in a stable reproducible supine position for radiation therapy. Pelvic exam revealed the vaginal cuff to be intact. The patient's custom vaginal cylinder was placed in the proximal vagina. This was affixed to the CT/MR stabilization plate to prevent slippage. Patient tolerated the placement well.  Verification simulation note:  A fiducial marker was placed within the vaginal cylinder. An AP and lateral film was then obtained through the pelvis area. This documented accurate position of the vaginal cylinder for treatment.  HDR BRACHYTHERAPY TREATMENT  The remote afterloading device was affixed to the vaginal cylinder by catheter. Patient then proceeded to undergo her third high-dose-rate treatment directed at the proximal vagina. The patient was prescribed a dose of 6 gray to be delivered to the mucosal surface. Treatment length was 3.0 cm. Patient was treated with 1 channel using 7 dwell  positions. Treatment time was 217.3 seconds. Iridium 192 was the high-dose-rate source for treatment. The patient tolerated the treatment well. After completion of her therapy, a radiation survey was performed documenting return of the iridium source into the [employer] safe.   PLAN: The patient will return on 03/01/18 for her fourth HDR treatment.  . The patient has been constipated since last Friday with her chemotherapy. She subsequently developed hemorrhoids and is quite symptomatic from this issue. She has tried over-the-counter remedies which is been minimally helpful. In light of this the patient has been given a prescription for Anusol HC suppositories to use twice a day for 5-6 days. ________________________________   Blair Promise, PhD, MD  This document serves as a record of services personally performed by Gery Pray, MD. It was created on his behalf by Rae Lips, a trained medical scribe. The creation of this record is based on the scribe's personal observations and the provider's statements to them. This document has been checked and approved by the attending provider.

## 2018-02-23 ENCOUNTER — Ambulatory Visit: Payer: No Typology Code available for payment source | Admitting: Radiation Oncology

## 2018-02-23 ENCOUNTER — Telehealth: Payer: Self-pay

## 2018-02-23 NOTE — Telephone Encounter (Signed)
Called with below message. Verbalized understanding. 

## 2018-02-23 NOTE — Telephone Encounter (Signed)
I do not recommend Anusol for now OK to alternate Advil and tramadol Agree with laxatives; add stool softeners daily

## 2018-02-23 NOTE — Telephone Encounter (Signed)
She called and left a message to call her.  Called back. She is having problems with hemorrhoids. She became constipated over the weekend and strained. She is over the counter medication for constipation and is no longer constipated. She is doing sitz baths, has tried preparation H, using ice packs and Dr. Sondra Come gave her Anusol suppositories yesterday to try. The Anusol burns.   She is going to try a tramadol for the pain. Would it be okay to take Advil and alternate with the tramadol? Does Dr. Alvy Bimler have any other suggestions?

## 2018-02-28 ENCOUNTER — Telehealth: Payer: Self-pay | Admitting: *Deleted

## 2018-02-28 NOTE — Telephone Encounter (Signed)
Called patient to remind of HDR Tx. for 03-01-18 @ 1 pm, lvm for a return call

## 2018-03-01 ENCOUNTER — Ambulatory Visit
Admission: RE | Admit: 2018-03-01 | Discharge: 2018-03-01 | Disposition: A | Discharge status: Home / Self Care | Payer: No Typology Code available for payment source | Source: Ambulatory Visit | Attending: Radiation Oncology | Admitting: Radiation Oncology

## 2018-03-01 DIAGNOSIS — Z510 Encounter for antineoplastic radiation therapy: Secondary | ICD-10-CM | POA: Diagnosis not present

## 2018-03-01 DIAGNOSIS — C541 Malignant neoplasm of endometrium: Secondary | ICD-10-CM

## 2018-03-01 NOTE — Progress Notes (Signed)
  Radiation Oncology         [phone] ________________________________  Name: Connie Simmons MRN: [id number]  Date: 03/01/2018  DOB: 1946-07-28  CC: Kelton Pillar, MD  Heath Lark, MD  HDR BRACHYTHERAPY NOTE  DIAGNOSIS: 72 y.o. 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 She will be treated with a 3.0 cm diameter segmented cylinder. This conforms to her anatomy without undue discomfort.  Vaginal brachytherapy procedure node: The patient was brought to the [facility] suite. Identity was confirmed. All relevant records and images related to the planned course of therapy were reviewed. The patient freely provided informed written consent to proceed with treatment after reviewing the details related to the planned course of therapy. The consent form was witnessed and verified by the simulation staff. Then, the patient was set-up in a stable reproducible supine position for radiation therapy. Pelvic exam revealed the vaginal cuff to be intact. The patient's custom vaginal cylinder was placed in the proximal vagina. This was affixed to the CT/MR stabilization plate to prevent slippage. Patient tolerated the placement well.  Verification simulation note:  A fiducial marker was placed within the vaginal cylinder. An AP and lateral film was then obtained through the pelvis area. This documented accurate position of the vaginal cylinder for treatment.  HDR BRACHYTHERAPY TREATMENT  The remote afterloading device was affixed to the vaginal cylinder by catheter. Patient then proceeded to undergo her fourth high-dose-rate treatment directed at the proximal vagina. The patient was prescribed a dose of 6 gray to be delivered to the mucosal surface. Treatment length was 3.0 cm. Patient was treated with 1 channel using 7  dwell positions. Treatment time was 232.2 seconds. Iridium 192 was the high-dose-rate source for treatment. The patient tolerated the treatment well. After completion of her therapy, a radiation survey was performed documenting return of the iridium source into the Gamma[employer] safe.   PLAN: The patient will return next week for her fifth HDR treatment. She denies any further problems with hemorrhoids.. ________________________________   Blair Promise, PhD, MD  This document serves as a record of services personally performed by Gery Pray, MD. It was created on his behalf by Bethann Humble, a trained medical scribe. The creation of this record is based on the scribe's personal observations and the provider's statements to them. This document has been checked and approved by the attending provider.

## 2018-03-02 ENCOUNTER — Ambulatory Visit: Payer: No Typology Code available for payment source | Admitting: Radiation Oncology

## 2018-03-06 ENCOUNTER — Ambulatory Visit: Payer: PRIVATE HEALTH INSURANCE | Admitting: Hematology and Oncology

## 2018-03-06 ENCOUNTER — Other Ambulatory Visit: Payer: PRIVATE HEALTH INSURANCE

## 2018-03-06 ENCOUNTER — Ambulatory Visit: Payer: PRIVATE HEALTH INSURANCE

## 2018-03-07 ENCOUNTER — Telehealth: Payer: Self-pay | Admitting: *Deleted

## 2018-03-07 NOTE — Telephone Encounter (Signed)
Called patient to remind of HDR Tx. for 03-08-18 @ 10 am, spoke with pt. and she is aware of this tx.

## 2018-03-08 ENCOUNTER — Encounter: Payer: Self-pay | Admitting: Oncology

## 2018-03-08 ENCOUNTER — Inpatient Hospital Stay (HOSPITAL_BASED_OUTPATIENT_CLINIC_OR_DEPARTMENT_OTHER): Payer: No Typology Code available for payment source | Admitting: Hematology and Oncology

## 2018-03-08 ENCOUNTER — Ambulatory Visit
Admission: RE | Admit: 2018-03-08 | Discharge: 2018-03-08 | Disposition: A | Discharge status: Home / Self Care | Payer: No Typology Code available for payment source | Source: Ambulatory Visit | Attending: Radiation Oncology | Admitting: Radiation Oncology

## 2018-03-08 ENCOUNTER — Encounter: Payer: Self-pay | Admitting: Radiation Oncology

## 2018-03-08 ENCOUNTER — Encounter: Payer: Self-pay | Admitting: Hematology and Oncology

## 2018-03-08 DIAGNOSIS — I808 Phlebitis and thrombophlebitis of other sites: Secondary | ICD-10-CM

## 2018-03-08 DIAGNOSIS — Z923 Personal history of irradiation: Secondary | ICD-10-CM

## 2018-03-08 DIAGNOSIS — Z5111 Encounter for antineoplastic chemotherapy: Secondary | ICD-10-CM | POA: Diagnosis not present

## 2018-03-08 DIAGNOSIS — C541 Malignant neoplasm of endometrium: Secondary | ICD-10-CM

## 2018-03-08 DIAGNOSIS — C7A8 Other malignant neuroendocrine tumors: Secondary | ICD-10-CM

## 2018-03-08 DIAGNOSIS — Z510 Encounter for antineoplastic radiation therapy: Secondary | ICD-10-CM | POA: Diagnosis not present

## 2018-03-08 HISTORY — DX: Personal history of irradiation: Z92.3

## 2018-03-08 MED ORDER — CEPHALEXIN 500 MG PO CAPS: 500.0000 mg | ORAL_CAPSULE | Freq: Three times a day (TID) | ORAL | 0 refills | Status: DC

## 2018-03-08 NOTE — Progress Notes (Signed)
[facility] OFFICE PROGRESS NOTE  Patient Care Team: Kelton Pillar, MD as PCP - General (Family Medicine)  ASSESSMENT & PLAN:  Endometrial cancer [facility]) She tolerated chemotherapy and radiation treatment well We will continue the same We will keep her appointment as scheduled next week  Superficial thrombophlebitis of arm She has signs of superficial thrombophlebitis over the left arm However, I am concerned about the tissue swelling and erythema surrounding the area The patient is at high risk of infection due to ongoing chemo and radiation treatment I recommend 1 week course of antibiotic therapy The patient is educated to inform me if the cellulitis does not improve on antibiotic treatment   No orders of the defined types were placed in this encounter.   INTERVAL HISTORY: Please see below for problem oriented charting. She is seen urgently due to progressive soft tissue swelling, mild pain and redness over the site of previous IV use She has noted flareup intermittently over the last few weeks while undergoing treatment She denies fever or chills  SUMMARY OF ONCOLOGIC HISTORY: Oncology History   High grade serous endometrial cancer and neuroendocrine tumor of peritoneum and left ovary  Endometrial component: ER/PR neg, MSI high     Endometrial cancer ([place])   11/10/2017 Pathology Results    Endometrium, biopsy - SEROUS CARCINOMA. - SEE COMMENT. Microscopic Comment Sections are of a high grade carcinoma with mixed glandular, papillary and solid patterns. The histomorphology and immunostain pattern of ER negative, PR negative, p53 positive and p16 positive is compatible with a serous carcinoma. Internal departmental review obtained (Dr. Lyndon Code) with agreement. The results were called to Dr. Nelda Marseille on on 11/14/17. (MEG:kh 11/14/17)      11/23/2017 Imaging    1. Grossly thickened and heterogeneous endometrium consistent with known endometrial carcinoma. No CT  findings to suggest serosal or extra uterine direct extension. No pelvic adenopathy.  2. There are worrisome small peritoneal nodules and a small mesenteric nodal mass in the root of the small bowel mesentery suspicious for metastatic disease. 3. No metastatic pulmonary nodules at the lung bases or evidence of solid abdominal organ metastatic disease.       12/01/2017 Pathology Results    1. Lymph node, sentinel, biopsy, right obturator - ONE BENIGN LYMPH NODE (0/1). 2. Peritoneum, biopsy, peritoneal nodule - NEUROENDOCRINE TUMOR. - SEE COMMENT. 3. Lymph node, sentinel, biopsy, left obturator - SEVEN BENIGN LYMPH NODES (0/7). 4. Uterus +/- tubes/ovaries, neoplastic ENDOMETRIUM. - HIGH GRADE SEROUS CARCINOMA, 3.7 CM. - CARCINOMA FOCALLY INVOLVES OUTER HALF OF MYOMETRIUM. - CERVIX, RIGHT OVARY AND BILATERAL FALLOPIAN TUBES FREE OF TUMOR. LEFT OVARY: - NEUROENDOCRINE TUMOR. - SEE COMMENT. Microscopic Comment 4. ONCOLOGY TABLE-UTERUS, CARCINOMA OR CARCINOSARCOMA Specimen: Uterus with bilateral ovaries and fallopian tubes with right and left obturator sentinel lymph nodes and peritoneal nodule biopsy. Procedure: Hysterectomy with bilateral salpingo-oophorectomy with right and left obturator lymph node biopsies. Lymph node sampling performed: Yes. Specimen integrity: Intact with cervix and body separated. Maximum tumor size: 3.7 cm Histologic type: High grade serous carcinoma. Grade: III. Myometrial invasion: 1.2 cm where myometrium is 2 cm in thickness. Cervical stromal involvement: No. Extent of involvement of other organs: High grade serous carcinoma involving endometrium and myometrium. Lymph - vascular invasion: No  Peritoneal washings: Pending. Lymph nodes: Examined: 7 Sentinel 0 Non-sentinel 7 Total Lymph nodes with metastasis: 0 Isolated tumor cells (< 0.2 mm): 0 Micrometastasis: (> 0.2 mm and < 2.0 mm): 0 Macrometastasis: (> 2.0 mm): 0 Extracapsular extension:  N/A  Pelvic lymph nodes: 0 involved of 8 lymph nodes. Para-aortic lymph nodes: 0 involved of 0 lymph nodes. Other (specify involvement and site): N/A TNM code: pT1b, pN0 FIGO Stage (based on pathologic findings, needs clinical correlation): IB. COMMENT: The endometrial tumor is 3.7 cm in greatest dimension and is a high grade serous carcinoma which focally invades the outer half of the myometrium.  The left ovary shows a 1 cm nodule of neoplasm characterized by sheets of cells with uniform, round to oval nuclei with rare mitotic figures. Iimmunohistochemistry is performed and the left ovarian tumor is positive with cytokeratin AE1/AE3, CD56, chromogranin, synaptophysin and CDX-2. The tumor is negative with inhibin, WT-1, calretinin, CD99, cytokerain 20, cytokeratin 7, estrogen receptor, progesterone receptor, vimentin, and p53. The morphology and immunophenotype are consistent with a low to intermediate grade neuroendocrine tumor and CDX-2 positivity suggests possible gastrointestinal origin. There is a similar 0.6 cm nodule in the paraovarian connective tissue and the peritoneal nodule (part 2) has identical features.       12/01/2017 Surgery    Operation: Robotic-assisted laparoscopic total hysterectomy >250gm with bilateral salpingoophorectomy, SLN biopsy, peritoneal nodule resection  Surgeon: Donaciano Eva  Operative Findings: 14cm uterus, bulky with fibroids, grossly normal tubes and ovaries. No ascites. Multiple peritoneal nodules covering surface of pelvic peritoneum including near round ligament inserion on the left, sigmoid colon mesentery. There were also peritoneal nodules on the right peritoneal gutter adjacent to cecum which were above the pelvic brim (abdominal).        12/21/2017 Imaging    Prior hysterectomy. No evidence of residual or metastatic carcinoma within the abdomen or pelvis.  Tiny indeterminate bilateral upper lobe pulmonary nodules, likely postinflammatory  in etiology although metastatic disease cannot definitely be excluded. Recommend continued follow-up by chest CT in 6 months.  Dominant 2.4 cm left thyroid lobe nodule. Consider thyroid ultrasound for further evaluation.       12/29/2017 Procedure    Successful placement of a right internal jugular approach power injectable Port-A-Cath. The catheter is ready for immediate use.      01/26/2018 Procedure    Well-positioned and normally functioning right IJ port catheter without evidence of complication.       REVIEW OF SYSTEMS:   Constitutional: Denies fevers, chills or abnormal weight loss Eyes: Denies blurriness of vision Ears, nose, mouth, throat, and face: Denies mucositis or sore throat Respiratory: Denies cough, dyspnea or wheezes Cardiovascular: Denies palpitation, chest discomfort or lower extremity swelling Gastrointestinal:  Denies nausea, heartburn or change in bowel habits Lymphatics: Denies new lymphadenopathy or easy bruising Neurological:Denies numbness, tingling or new weaknesses Behavioral/Psych: Mood is stable, no new changes  All other systems were reviewed with the patient and are negative.  I have reviewed the past medical history, past surgical history, social history and family history with the patient and they are unchanged from previous note.  ALLERGIES:  is allergic to fish oil; simvastatin; codeine; and sulfa antibiotics.  MEDICATIONS:  Current Outpatient Medications  Medication Sig Dispense Refill  . acetaminophen (TYLENOL) 500 MG tablet Take 1,000 mg by mouth 2 (two) times daily as needed for moderate pain or headache.    Marland Kitchen alum & mag hydroxide-simeth (MAALOX/MYLANTA) 200-200-20 MG/5ML suspension Take by mouth every 6 (six) hours as needed for indigestion or heartburn.    . benazepril-hydrochlorthiazide (LOTENSIN HCT) 20-12.5 MG tablet Take 1 tablet by mouth every morning.  1  . calcium carbonate (TUMS - DOSED IN MG ELEMENTAL CALCIUM) 500 MG chewable  tablet Chew  2 tablets by mouth daily.    . cephALEXin (KEFLEX) 500 MG capsule Take 1 capsule (500 mg total) by mouth 3 (three) times daily. 21 capsule 0  . dexamethasone (DECADRON) 4 MG tablet Take 5 tabs the night before and 5 tabs the morning of chemotherapy, every 3 weeks, with food (Patient taking differently: Take 4 mg by mouth 2 (two) times daily. Take 3 tabs the night before and 3 tabs the morning of chemotherapy, every 3 weeks, with food) 60 tablet 0  . fluticasone (FLONASE) 50 MCG/ACT nasal spray Place 1 spray into both nostrils daily as needed for allergies or rhinitis.    . hydrocortisone (ANUSOL-HC) 25 MG suppository Place 1 suppository (25 mg total) rectally 2 (two) times daily. 12 suppository 0  . ibuprofen (ADVIL,MOTRIN) 200 MG tablet Take 400-600 mg by mouth 2 (two) times daily as needed for moderate pain.     Marland Kitchen lidocaine-prilocaine (EMLA) cream Apply to affected area once 30 g 3  . loratadine (CLARITIN) 10 MG tablet Take 10 mg by mouth daily.    . Multiple Vitamin (MULTIVITAMIN WITH MINERALS) TABS tablet Take 1 tablet by mouth daily.    . ondansetron (ZOFRAN) 8 MG tablet Take 1 tablet (8 mg total) by mouth every 8 (eight) hours as needed for refractory nausea / vomiting. Start on day 3 after chemo. (Patient not taking: Reported on 02/09/2018) 30 tablet 1  . prochlorperazine (COMPAZINE) 10 MG tablet Take 1 tablet (10 mg total) by mouth every 6 (six) hours as needed (Nausea or vomiting). (Patient not taking: Reported on 01/09/2018) 30 tablet 1  . traMADol (ULTRAM) 50 MG tablet Take 1 tablet (50 mg total) by mouth every 6 (six) hours as needed. 60 tablet 0   No current facility-administered medications for this visit.     PHYSICAL EXAMINATION: ECOG PERFORMANCE STATUS: 1 - Symptomatic but completely ambulatory  Vitals:   03/08/18 1125  BP: 115/66  Pulse: 75  Resp: 18  Temp: 98 F (36.7 C)  SpO2: 100%   Filed Weights   03/08/18 1125  Weight: 221 lb 3.2 oz (100.3 kg)     GENERAL:alert, no distress and comfortable SKIN: The area over the left arm appears mildly swollen and red, tender to touch EYES: normal, Conjunctiva are pink and non-injected, sclera clear OROPHARYNX:no exudate, no erythema and lips, buccal mucosa, and tongue normal  NECK: supple, thyroid normal size, non-tender, without nodularity LYMPH:  no palpable lymphadenopathy in the cervical, axillary or inguinal LUNGS: clear to auscultation and percussion with normal breathing effort HEART: regular rate & rhythm and no murmurs and no lower extremity edema ABDOMEN:abdomen soft, non-tender and normal bowel sounds Musculoskeletal:no cyanosis of digits and no clubbing  NEURO: alert & oriented x 3 with fluent speech, no focal motor/sensory deficits  LABORATORY DATA:  I have reviewed the data as listed    Component Value Date/Time   NA 137 02/17/2018 0802   K 4.0 02/17/2018 0802   CL 101 02/17/2018 0802   CO2 26 02/17/2018 0802   GLUCOSE 126 02/17/2018 0802   BUN 20 02/17/2018 0802   CREATININE 0.81 02/17/2018 0802   CALCIUM 10.1 02/17/2018 0802   PROT 7.2 02/17/2018 0802   ALBUMIN 4.1 02/17/2018 0802   AST 13 02/17/2018 0802   ALT 14 02/17/2018 0802   ALKPHOS 98 02/17/2018 0802   BILITOT 0.4 02/17/2018 0802   GFRNONAA >60 02/17/2018 0802   GFRAA >60 02/17/2018 0802    No results found for: SPEP, UPEP  Lab Results  Component Value Date   WBC 9.1 02/17/2018   NEUTROABS 8.5 (H) 02/17/2018   HGB 10.1 (L) 02/17/2018   HCT 29.5 (L) 02/17/2018   MCV 82.6 02/17/2018   PLT 143 (L) 02/17/2018      Chemistry      Component Value Date/Time   NA 137 02/17/2018 0802   K 4.0 02/17/2018 0802   CL 101 02/17/2018 0802   CO2 26 02/17/2018 0802   BUN 20 02/17/2018 0802   CREATININE 0.81 02/17/2018 0802      Component Value Date/Time   CALCIUM 10.1 02/17/2018 0802   ALKPHOS 98 02/17/2018 0802   AST 13 02/17/2018 0802   ALT 14 02/17/2018 0802   BILITOT 0.4 02/17/2018 0802          All questions were answered. The patient knows to call the clinic with any problems, questions or concerns. No barriers to learning was detected.  I spent 10 minutes counseling the patient face to face. The total time spent in the appointment was 15 minutes and more than 50% was on counseling and review of test results  Heath Lark, MD 03/09/2018 7:39 AM

## 2018-03-08 NOTE — Progress Notes (Signed)
Patient came in to sign her approval letter for the one-time $400 [place]. Patient was also approved for one-time $400 [employer] grant.  Patient has a copy of the [facility] approval as well as the expenses it covers and outpatient pharmacy information. Patient received a gas card today from her grant.  She has my contact name and number for any additional financial questions or concerns.

## 2018-03-08 NOTE — Progress Notes (Signed)
  Radiation Oncology         [phone] ________________________________  Name: Connie Simmons MRN: [id number]  Date: 03/08/2018  DOB: 04-Sep-1946  CC: Kelton Pillar, MD  Heath Lark, MD  HDR BRACHYTHERAPY NOTE  DIAGNOSIS: 72 y.o. 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 She will be treated with a 3.0 cm diameter segmented cylinder. This conforms to her anatomy without undue discomfort.  Vaginal brachytherapy procedure node: The patient was brought to the [place] suite. Identity was confirmed. All relevant records and images related to the planned course of therapy were reviewed. The patient freely provided informed written consent to proceed with treatment after reviewing the details related to the planned course of therapy. The consent form was witnessed and verified by the simulation staff. Then, the patient was set-up in a stable reproducible supine position for radiation therapy. Pelvic exam revealed the vaginal cuff to be intact . The patient's custom vaginal cylinder was placed in the proximal vagina. This was affixed to the CT/MR stabilization plate to prevent slippage. Patient tolerated the placement well.  Verification simulation note:  A fiducial marker was placed within the vaginal cylinder. An AP and lateral film was then obtained through the pelvis area. This documented accurate position of the vaginal cylinder for treatment.  HDR BRACHYTHERAPY TREATMENT  The remote afterloading device was affixed to the vaginal cylinder by catheter. Patient then proceeded to undergo her fifth high-dose-rate treatment directed at the proximal vagina. The patient was prescribed a dose of 6.0 gray to be delivered to the mucosal surface. Treatment length was 3.0 cm. Patient was treated with 1 channel using 7  dwell positions. Treatment time was 247.9 seconds. Iridium 192 was the high-dose-rate source for treatment. The patient tolerated the treatment well. After completion of her therapy, a radiation survey was performed documenting return of the iridium source into the [employer] safe.   PLAN: The patient has completed her adjuvant radiation therapy for her stage IB serous endometrial cancer. She will continue with 3 more cycles of chemotherapy for management of her neuro endocrine tumor of the left ovary.  On presentation today the patient was noted to  have some swelling and erythema to the left forearm where she had previously received chemotherapy. The patient may have phlebitis or cellulitis and will be seen by medical oncology for further evaluation later this morning.  Routine follow-up in radiation oncology in one month concerning her endometrial cancer. ________________________________  Blair Promise, PhD, MD

## 2018-03-09 ENCOUNTER — Encounter: Payer: Self-pay | Admitting: Hematology and Oncology

## 2018-03-09 ENCOUNTER — Ambulatory Visit: Payer: No Typology Code available for payment source | Admitting: Radiation Oncology

## 2018-03-09 NOTE — Assessment & Plan Note (Signed)
She has signs of superficial thrombophlebitis over the left arm However, I am concerned about the tissue swelling and erythema surrounding the area The patient is at high risk of infection due to ongoing chemo and radiation treatment I recommend 1 week course of antibiotic therapy The patient is educated to inform me if the cellulitis does not improve on antibiotic treatment

## 2018-03-09 NOTE — Assessment & Plan Note (Signed)
She tolerated chemotherapy and radiation treatment well We will continue the same We will keep her appointment as scheduled next week

## 2018-03-14 ENCOUNTER — Inpatient Hospital Stay (HOSPITAL_BASED_OUTPATIENT_CLINIC_OR_DEPARTMENT_OTHER): Payer: No Typology Code available for payment source | Admitting: Hematology and Oncology

## 2018-03-14 ENCOUNTER — Inpatient Hospital Stay: Payer: No Typology Code available for payment source

## 2018-03-14 ENCOUNTER — Telehealth: Payer: Self-pay | Admitting: Hematology and Oncology

## 2018-03-14 ENCOUNTER — Encounter: Payer: Self-pay | Admitting: Hematology and Oncology

## 2018-03-14 DIAGNOSIS — C541 Malignant neoplasm of endometrium: Secondary | ICD-10-CM

## 2018-03-14 DIAGNOSIS — C7A8 Other malignant neuroendocrine tumors: Secondary | ICD-10-CM

## 2018-03-14 DIAGNOSIS — T451X5A Adverse effect of antineoplastic and immunosuppressive drugs, initial encounter: Secondary | ICD-10-CM

## 2018-03-14 DIAGNOSIS — M898X9 Other specified disorders of bone, unspecified site: Secondary | ICD-10-CM | POA: Diagnosis not present

## 2018-03-14 DIAGNOSIS — G620 Drug-induced polyneuropathy: Secondary | ICD-10-CM

## 2018-03-14 DIAGNOSIS — I808 Phlebitis and thrombophlebitis of other sites: Secondary | ICD-10-CM

## 2018-03-14 DIAGNOSIS — D61818 Other pancytopenia: Secondary | ICD-10-CM | POA: Diagnosis not present

## 2018-03-14 DIAGNOSIS — Z95828 Presence of other vascular implants and grafts: Secondary | ICD-10-CM

## 2018-03-14 DIAGNOSIS — Z5111 Encounter for antineoplastic chemotherapy: Secondary | ICD-10-CM | POA: Diagnosis not present

## 2018-03-14 LAB — COMPREHENSIVE METABOLIC PANEL
ALT: 17 U/L (ref 14–54)
AST: 20 U/L (ref 15–41)
Albumin: 4.3 g/dL (ref 3.5–5.0)
Alkaline Phosphatase: 89 U/L (ref 38–126)
Anion gap: 13 (ref 5–15)
BUN: 19 mg/dL (ref 6–20)
CO2: 23 mmol/L (ref 22–32)
Calcium: 9.5 mg/dL (ref 8.9–10.3)
Chloride: 101 mmol/L (ref 101–111)
Creatinine, Ser: 0.72 mg/dL (ref 0.44–1.00)
GFR calc Af Amer: >60 mL/min (ref >60)
GFR calc non Af Amer: >60 mL/min (ref >60)
Glucose, Bld: 132 mg/dL — ABNORMAL HIGH (ref 65–99)
Potassium: 3.8 mmol/L (ref 3.5–5.1)
Sodium: 137 mmol/L (ref 135–145)
Total Bilirubin: 0.4 mg/dL (ref 0.3–1.2)
Total Protein: 7.2 g/dL (ref 6.5–8.1)

## 2018-03-14 LAB — CBC WITH DIFFERENTIAL (CANCER CENTER ONLY)
Basophils Absolute: 0 10*3/uL (ref 0.0–0.1)
Basophils Relative: 0 %
Eosinophils Absolute: 0 10*3/uL (ref 0.0–0.5)
Eosinophils Relative: 0 %
HCT: 26.3 % — ABNORMAL LOW (ref 34.8–46.6)
Hemoglobin: 9 g/dL — ABNORMAL LOW (ref 11.6–15.9)
Lymphocytes Relative: 11 %
Lymphs Abs: 0.4 10*3/uL — ABNORMAL LOW (ref 0.9–3.3)
MCH: 30.5 pg (ref 25.1–34.0)
MCHC: 34.3 g/dL (ref 31.5–36.0)
MCV: 88.9 fL (ref 79.5–101.0)
Monocytes Absolute: 0 10*3/uL — ABNORMAL LOW (ref 0.1–0.9)
Monocytes Relative: 1 %
Neutro Abs: 3.2 10*3/uL (ref 1.5–6.5)
Neutrophils Relative %: 88 %
Platelet Count: 223 10*3/uL (ref 145–400)
RBC: 2.95 MIL/uL — ABNORMAL LOW (ref 3.70–5.45)
RDW: 24.6 % — ABNORMAL HIGH (ref 11.2–14.5)
WBC Count: 3.6 10*3/uL — ABNORMAL LOW (ref 3.9–10.3)

## 2018-03-14 MED ORDER — PEGFILGRASTIM 6 MG/0.6ML ~~LOC~~ PSKT
6.0000 mg | PREFILLED_SYRINGE | Freq: Once | SUBCUTANEOUS | Status: AC
  Administered 2018-03-14: 6 mg via SUBCUTANEOUS

## 2018-03-14 MED ORDER — FOSAPREPITANT DIMEGLUMINE INJECTION 150 MG
Freq: Once | INTRAVENOUS | Status: AC
  Administered 2018-03-14: 11:00:00 via INTRAVENOUS
  Filled 2018-03-14: qty 5

## 2018-03-14 MED ORDER — SODIUM CHLORIDE 0.9% FLUSH
10.0000 mL | INTRAVENOUS | Status: DC | PRN
  Administered 2018-03-14: 10 mL
  Filled 2018-03-14: qty 10

## 2018-03-14 MED ORDER — HEPARIN SOD (PORK) LOCK FLUSH 100 UNIT/ML IV SOLN
500.0000 [IU] | Freq: Once | INTRAVENOUS | Status: AC | PRN
  Administered 2018-03-14: 500 [IU]
  Filled 2018-03-14: qty 5

## 2018-03-14 MED ORDER — PEGFILGRASTIM 6 MG/0.6ML ~~LOC~~ PSKT
PREFILLED_SYRINGE | SUBCUTANEOUS | Status: AC
  Filled 2018-03-14: qty 0.6

## 2018-03-14 MED ORDER — DIPHENHYDRAMINE HCL 50 MG/ML IJ SOLN
INTRAMUSCULAR | Status: AC
  Filled 2018-03-14: qty 1

## 2018-03-14 MED ORDER — FAMOTIDINE IN NACL 20-0.9 MG/50ML-% IV SOLN
20.0000 mg | Freq: Once | INTRAVENOUS | Status: AC
  Administered 2018-03-14: 20 mg via INTRAVENOUS

## 2018-03-14 MED ORDER — SODIUM CHLORIDE 0.9 % IV SOLN
Freq: Once | INTRAVENOUS | Status: AC
  Administered 2018-03-14: 10:00:00 via INTRAVENOUS

## 2018-03-14 MED ORDER — SODIUM CHLORIDE 0.9% FLUSH
10.0000 mL | Freq: Once | INTRAVENOUS | Status: AC
  Administered 2018-03-14: 10 mL
  Filled 2018-03-14: qty 10

## 2018-03-14 MED ORDER — FAMOTIDINE IN NACL 20-0.9 MG/50ML-% IV SOLN
INTRAVENOUS | Status: AC
  Filled 2018-03-14: qty 50

## 2018-03-14 MED ORDER — DIPHENHYDRAMINE HCL 50 MG/ML IJ SOLN
50.0000 mg | Freq: Once | INTRAMUSCULAR | Status: AC
  Administered 2018-03-14: 50 mg via INTRAVENOUS

## 2018-03-14 MED ORDER — SODIUM CHLORIDE 0.9 % IV SOLN
105.0000 mg/m2 | Freq: Once | INTRAVENOUS | Status: AC
  Administered 2018-03-14: 222 mg via INTRAVENOUS
  Filled 2018-03-14: qty 37

## 2018-03-14 MED ORDER — SODIUM CHLORIDE 0.9 % IV SOLN
527.0400 mg | Freq: Once | INTRAVENOUS | Status: AC
  Administered 2018-03-14: 530 mg via INTRAVENOUS
  Filled 2018-03-14: qty 53

## 2018-03-14 MED ORDER — PALONOSETRON HCL INJECTION 0.25 MG/5ML
INTRAVENOUS | Status: AC
  Filled 2018-03-14: qty 5

## 2018-03-14 MED ORDER — PALONOSETRON HCL INJECTION 0.25 MG/5ML
0.2500 mg | Freq: Once | INTRAVENOUS | Status: AC
  Administered 2018-03-14: 0.25 mg via INTRAVENOUS

## 2018-03-14 NOTE — Assessment & Plan Note (Addendum)
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

## 2018-03-14 NOTE — Progress Notes (Signed)
[facility] OFFICE PROGRESS NOTE  Patient Care Team: Kelton Pillar, MD as PCP - General (Family Medicine)  ASSESSMENT & PLAN:  Endometrial cancer [facility]) She tolerated chemotherapy and radiation treatment well We will continue the same treatment without delay  Superficial thrombophlebitis of arm This is stable/improved She will complete her course of antibiotics. No need to delay chemo today  Peripheral neuropathy due to chemotherapy [facility]) She has profound neuropathy from treatment Since recent dose adjustment, she tolerated treatment better I recommend her to continue on dose adjustment as above  Pancytopenia, acquired ([facility]) This is stable since last chemotherapy dose adjustment She is not symptomatic  Bone pain due to G-CSF Her bone pain is less with recent dose adjustment We will continue similar dose adjustment   No orders of the defined types were placed in this encounter.   INTERVAL HISTORY: Please see below for problem oriented charting. 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 - SEE COMMENT. Microscopic Comment Sections are of a high grade carcinoma with mixed glandular, papillary and solid patterns. The histomorphology and immunostain pattern of ER negative, PR negative, p53 positive and p16 positive is compatible with a serous carcinoma. Internal  departmental review obtained (Dr. Lyndon Code) with agreement. The results were called to Dr. Nelda Marseille on on 11/14/17. (MEG:kh 11/14/17)      11/23/2017 Imaging    1. Grossly thickened and heterogeneous endometrium consistent with known endometrial carcinoma. No CT findings to suggest serosal or extra uterine direct extension. No pelvic adenopathy.  2. There are worrisome small peritoneal nodules and a small mesenteric nodal mass in the root of the small bowel mesentery suspicious for metastatic disease. 3. No metastatic pulmonary nodules at the lung bases or evidence of solid abdominal organ metastatic disease.       12/01/2017 Pathology Results    1. Lymph node, sentinel, biopsy, right obturator - ONE BENIGN LYMPH NODE (0/1). 2. Peritoneum, biopsy, peritoneal nodule - NEUROENDOCRINE TUMOR. - SEE COMMENT. 3. Lymph node, sentinel, biopsy, left obturator - SEVEN BENIGN LYMPH NODES (0/7). 4. Uterus +/- tubes/ovaries, neoplastic ENDOMETRIUM. - HIGH GRADE SEROUS CARCINOMA, 3.7 CM. - CARCINOMA FOCALLY INVOLVES OUTER HALF OF MYOMETRIUM. - CERVIX, RIGHT OVARY AND BILATERAL FALLOPIAN TUBES FREE OF TUMOR. LEFT OVARY: - NEUROENDOCRINE TUMOR. - SEE COMMENT. Microscopic Comment 4. ONCOLOGY TABLE-UTERUS, CARCINOMA OR CARCINOSARCOMA Specimen: Uterus with bilateral ovaries and fallopian tubes with right and left obturator sentinel lymph nodes and peritoneal nodule biopsy. Procedure: Hysterectomy with bilateral salpingo-oophorectomy with right and left obturator lymph node biopsies. Lymph node sampling performed: Yes. Specimen integrity: Intact with cervix and body separated. Maximum tumor size: 3.7 cm Histologic type: High grade serous carcinoma. Grade: III. Myometrial invasion: 1.2 cm where myometrium is 2 cm in thickness. Cervical stromal involvement: No. Extent of involvement of other organs: High grade serous carcinoma involving endometrium and myometrium. Lymph - vascular invasion: No  Peritoneal  washings: Pending. Lymph nodes: Examined: 7 Sentinel 0 Non-sentinel 7  Total Lymph nodes with metastasis: 0 Isolated tumor cells (< 0.2 mm): 0 Micrometastasis: (> 0.2 mm and < 2.0 mm): 0 Macrometastasis: (> 2.0 mm): 0 Extracapsular extension: N/A Pelvic lymph nodes: 0 involved of 8 lymph nodes. Para-aortic lymph nodes: 0 involved of 0 lymph nodes. Other (specify involvement and site): N/A TNM code: pT1b, pN0 FIGO Stage (based on pathologic findings, needs clinical correlation): IB. COMMENT: The endometrial tumor is 3.7 cm in greatest dimension and is a high grade serous carcinoma which focally invades the outer half of the myometrium.  The left ovary shows a 1 cm nodule of neoplasm characterized by sheets of cells with uniform, round to oval nuclei with rare mitotic figures. Iimmunohistochemistry is performed and the left ovarian tumor is positive with cytokeratin AE1/AE3, CD56, chromogranin, synaptophysin and CDX-2. The tumor is negative with inhibin, WT-1, calretinin, CD99, cytokerain 20, cytokeratin 7, estrogen receptor, progesterone receptor, vimentin, and p53. The morphology and immunophenotype are consistent with a low to intermediate grade neuroendocrine tumor and CDX-2 positivity suggests possible gastrointestinal origin. There is a similar 0.6 cm nodule in the paraovarian connective tissue and the peritoneal nodule (part 2) has identical features.       12/01/2017 Surgery    Operation: Robotic-assisted laparoscopic total hysterectomy >250gm with bilateral salpingoophorectomy, SLN biopsy, peritoneal nodule resection  Surgeon: Donaciano Eva  Operative Findings: 14cm uterus, bulky with fibroids, grossly normal tubes and ovaries. No ascites. Multiple peritoneal nodules covering surface of pelvic peritoneum including near round ligament inserion on the left, sigmoid colon mesentery. There were also peritoneal nodules on the right peritoneal gutter adjacent to cecum which were  above the pelvic brim (abdominal).        12/21/2017 Imaging    Prior hysterectomy. No evidence of residual or metastatic carcinoma within the abdomen or pelvis.  Tiny indeterminate bilateral upper lobe pulmonary nodules, likely postinflammatory in etiology although metastatic disease cannot definitely be excluded. Recommend continued follow-up by chest CT in 6 months.  Dominant 2.4 cm left thyroid lobe nodule. Consider thyroid ultrasound for further evaluation.       12/29/2017 Procedure    Successful placement of a right internal jugular approach power injectable Port-A-Cath. The catheter is ready for immediate use.      01/26/2018 Procedure    Well-positioned and normally functioning right IJ port catheter without evidence of complication.       REVIEW OF SYSTEMS:   Constitutional: Denies fevers, chills or abnormal weight loss Eyes: Denies blurriness of vision Ears, nose, mouth, throat, and face: Denies mucositis or sore throat Respiratory: Denies cough, dyspnea or wheezes Cardiovascular: Denies palpitation, chest discomfort or lower extremity swelling Lymphatics: Denies new lymphadenopathy or easy bruising Neurological:Denies numbness, tingling or new weaknesses Behavioral/Psych: Mood is stable, no new changes  All other systems were reviewed with the patient and are negative.  I have reviewed the past medical history, past surgical history, social history and family history with the patient and they are unchanged from previous note.  ALLERGIES:  is allergic to fish oil; simvastatin; codeine; and sulfa antibiotics.  MEDICATIONS:  Current Outpatient Medications  Medication Sig Dispense Refill  . acetaminophen (TYLENOL) 500 MG tablet Take 1,000 mg by mouth 2 (two) times daily as needed for moderate pain or headache.    Marland Kitchen alum & mag hydroxide-simeth (MAALOX/MYLANTA) 200-200-20 MG/5ML suspension Take by mouth every 6 (six) hours as needed for indigestion or heartburn.    .  benazepril-hydrochlorthiazide (LOTENSIN HCT) 20-12.5 MG tablet Take 1 tablet by mouth  every morning.  1  . calcium carbonate (TUMS - DOSED IN MG ELEMENTAL CALCIUM) 500 MG chewable tablet Chew 2 tablets by mouth daily.    . cephALEXin (KEFLEX) 500 MG capsule Take 1 capsule (500 mg total) by mouth 3 (three) times daily. 21 capsule 0  . dexamethasone (DECADRON) 4 MG tablet Take 5 tabs the night before and 5 tabs the morning of chemotherapy, every 3 weeks, with food (Patient taking differently: Take 4 mg by mouth 2 (two) times daily. Take 3 tabs the night before and 3 tabs the morning of chemotherapy, every 3 weeks, with food) 60 tablet 0  . fluticasone (FLONASE) 50 MCG/ACT nasal spray Place 1 spray into both nostrils daily as needed for allergies or rhinitis.    . hydrocortisone (ANUSOL-HC) 25 MG suppository Place 1 suppository (25 mg total) rectally 2 (two) times daily. 12 suppository 0  . ibuprofen (ADVIL,MOTRIN) 200 MG tablet Take 400-600 mg by mouth 2 (two) times daily as needed for moderate pain.     Marland Kitchen lidocaine-prilocaine (EMLA) cream Apply to affected area once 30 g 3  . loratadine (CLARITIN) 10 MG tablet Take 10 mg by mouth daily.    . Multiple Vitamin (MULTIVITAMIN WITH MINERALS) TABS tablet Take 1 tablet by mouth daily.    . ondansetron (ZOFRAN) 8 MG tablet Take 1 tablet (8 mg total) by mouth every 8 (eight) hours as needed for refractory nausea / vomiting. Start on day 3 after chemo. (Patient not taking: Reported on 02/09/2018) 30 tablet 1  . prochlorperazine (COMPAZINE) 10 MG tablet Take 1 tablet (10 mg total) by mouth every 6 (six) hours as needed (Nausea or vomiting). (Patient not taking: Reported on 01/09/2018) 30 tablet 1  . traMADol (ULTRAM) 50 MG tablet Take 1 tablet (50 mg total) by mouth every 6 (six) hours as needed. 60 tablet 0   No current facility-administered medications for this visit.     PHYSICAL EXAMINATION: ECOG PERFORMANCE STATUS: 1 - Symptomatic but completely  ambulatory  Vitals:   03/14/18 0844  BP: 123/66  Pulse: 68  Resp: 18  Temp: 98.4 F (36.9 C)  SpO2: 100%   Filed Weights   03/14/18 0844  Weight: 221 lb 4.8 oz (100.4 kg)    GENERAL:alert, no distress and comfortable SKIN: Recent superficial thrombophlebitis is improving  eYES: normal, Conjunctiva are pink and non-injected, sclera clear OROPHARYNX:no exudate, no erythema and lips, buccal mucosa, and tongue normal  NECK: supple, thyroid normal size, non-tender, without nodularity LYMPH:  no palpable lymphadenopathy in the cervical, axillary or inguinal LUNGS: clear to auscultation and percussion with normal breathing effort HEART: regular rate & rhythm and no murmurs and no lower extremity edema ABDOMEN:abdomen soft, non-tender and normal bowel sounds Musculoskeletal:no cyanosis of digits and no clubbing  NEURO: alert & oriented x 3 with fluent speech, no focal motor/sensory deficits  LABORATORY DATA:  I have reviewed the data as listed    Component Value Date/Time   NA 137 02/17/2018 0802   K 4.0 02/17/2018 0802   CL 101 02/17/2018 0802   CO2 26 02/17/2018 0802   GLUCOSE 126 02/17/2018 0802   BUN 20 02/17/2018 0802   CREATININE 0.81 02/17/2018 0802   CALCIUM 10.1 02/17/2018 0802   PROT 7.2 02/17/2018 0802   ALBUMIN 4.1 02/17/2018 0802   AST 13 02/17/2018 0802   ALT 14 02/17/2018 0802   ALKPHOS 98 02/17/2018 0802   BILITOT 0.4 02/17/2018 0802   GFRNONAA >60 02/17/2018 0802   GFRAA >60  02/17/2018 0802    No results found for: SPEP, UPEP  Lab Results  Component Value Date   WBC 3.6 (L) 03/14/2018   NEUTROABS 3.2 03/14/2018   HGB 9.0 (L) 03/14/2018   HCT 26.3 (L) 03/14/2018   MCV 88.9 03/14/2018   PLT 223 03/14/2018      Chemistry      Component Value Date/Time   NA 137 02/17/2018 0802   K 4.0 02/17/2018 0802   CL 101 02/17/2018 0802   CO2 26 02/17/2018 0802   BUN 20 02/17/2018 0802   CREATININE 0.81 02/17/2018 0802      Component Value Date/Time    CALCIUM 10.1 02/17/2018 0802   ALKPHOS 98 02/17/2018 0802   AST 13 02/17/2018 0802   ALT 14 02/17/2018 0802   BILITOT 0.4 02/17/2018 0802       All questions were answered. The patient knows to call the clinic with any problems, questions or concerns. No barriers to learning was detected.  I spent 15 minutes counseling the patient face to face. The total time spent in the appointment was 20 minutes and more than 50% was on counseling and review of test results  Heath Lark, MD 03/14/2018 9:03 AM

## 2018-03-14 NOTE — Progress Notes (Signed)
FMLA successfully faxed to [facility] at [phone]. Mailed copy to patient address on file.

## 2018-03-14 NOTE — Patient Instructions (Signed)
[facility] Discharge Instructions for Patients Receiving Chemotherapy  Today you received the following chemotherapy agents Paclitaxel and Carboplatin  To help prevent nausea and vomiting after your treatment, we encourage you to take your nausea medication. If you develop nausea and vomiting that is not controlled by your nausea medication, call the clinic.   BELOW ARE SYMPTOMS THAT SHOULD BE REPORTED IMMEDIATELY:  *FEVER GREATER THAN 100.5 F  *CHILLS WITH OR WITHOUT FEVER  NAUSEA AND VOMITING THAT IS NOT CONTROLLED WITH YOUR NAUSEA MEDICATION  *UNUSUAL SHORTNESS OF BREATH  *UNUSUAL BRUISING OR BLEEDING  TENDERNESS IN MOUTH AND THROAT WITH OR WITHOUT PRESENCE OF ULCERS  *URINARY PROBLEMS  *BOWEL PROBLEMS  UNUSUAL RASH Items with * indicate a potential emergency and should be followed up as soon as possible.  Feel free to call the clinic should you have any questions or concerns. The clinic phone number is [phone].  Please show the [facility] at check-in to the Emergency Department and triage nurse.

## 2018-03-14 NOTE — Assessment & Plan Note (Addendum)
This is stable since last chemotherapy dose adjustment She is not symptomatic

## 2018-03-14 NOTE — Assessment & Plan Note (Signed)
She tolerated chemotherapy and radiation treatment well We will continue the same treatment without delay

## 2018-03-14 NOTE — Telephone Encounter (Signed)
Gave patient AVs and calendar of upcoming June and July appointments.  °

## 2018-03-14 NOTE — Assessment & Plan Note (Signed)
Her bone pain is less with recent dose adjustment We will continue similar dose adjustment

## 2018-03-14 NOTE — Assessment & Plan Note (Signed)
This is stable/improved She will complete her course of antibiotics. No need to delay chemo today

## 2018-03-15 NOTE — Progress Notes (Signed)
  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 Iridium 192 was the high dose rate source.   Beams/energy:   HDR vaginal 3.0 cm segmented vaginal cylinder, 6.0 Gy to the mucosal surface.  Narrative: The patient tolerated radiation treatment relatively well.   The patient experienced no acute side effects with treatment.  Plan: The patient has completed radiation treatment. The patient will return to radiation oncology clinic for routine followup in one month. I advised them to call or return sooner if they have any questions or concerns related to their recovery or treatment.  -----------------------------------  Blair Promise, PhD, MD  This document serves as a record of services personally performed by Gery Pray, MD. It was created on his behalf by Arlyce Harman, a trained medical scribe. The creation of this record is based on the scribe's personal observations and the provider's statements to them. This document has been checked and approved by the attending provider.

## 2018-03-20 ENCOUNTER — Ambulatory Visit: Payer: PRIVATE HEALTH INSURANCE | Admitting: Hematology and Oncology

## 2018-04-03 ENCOUNTER — Inpatient Hospital Stay (HOSPITAL_BASED_OUTPATIENT_CLINIC_OR_DEPARTMENT_OTHER): Payer: No Typology Code available for payment source | Admitting: Hematology and Oncology

## 2018-04-03 ENCOUNTER — Inpatient Hospital Stay: Payer: No Typology Code available for payment source

## 2018-04-03 ENCOUNTER — Inpatient Hospital Stay: Payer: No Typology Code available for payment source | Attending: Gynecologic Oncology

## 2018-04-03 ENCOUNTER — Telehealth: Payer: Self-pay | Admitting: Hematology and Oncology

## 2018-04-03 ENCOUNTER — Encounter: Payer: Self-pay | Admitting: Hematology and Oncology

## 2018-04-03 VITALS — BP 110/60 | HR 74 | Temp 98.1°F | Resp 18 | Ht 64.0 in | Wt 218.4 lb

## 2018-04-03 DIAGNOSIS — Z5189 Encounter for other specified aftercare: Secondary | ICD-10-CM | POA: Insufficient documentation

## 2018-04-03 DIAGNOSIS — G620 Drug-induced polyneuropathy: Secondary | ICD-10-CM | POA: Insufficient documentation

## 2018-04-03 DIAGNOSIS — Z95828 Presence of other vascular implants and grafts: Secondary | ICD-10-CM

## 2018-04-03 DIAGNOSIS — D61818 Other pancytopenia: Secondary | ICD-10-CM | POA: Insufficient documentation

## 2018-04-03 DIAGNOSIS — Z5111 Encounter for antineoplastic chemotherapy: Secondary | ICD-10-CM | POA: Insufficient documentation

## 2018-04-03 DIAGNOSIS — C541 Malignant neoplasm of endometrium: Secondary | ICD-10-CM

## 2018-04-03 DIAGNOSIS — R110 Nausea: Secondary | ICD-10-CM | POA: Diagnosis not present

## 2018-04-03 DIAGNOSIS — C7A8 Other malignant neuroendocrine tumors: Secondary | ICD-10-CM

## 2018-04-03 DIAGNOSIS — T451X5A Adverse effect of antineoplastic and immunosuppressive drugs, initial encounter: Secondary | ICD-10-CM | POA: Insufficient documentation

## 2018-04-03 LAB — CBC WITH DIFFERENTIAL (CANCER CENTER ONLY)
Basophils Absolute: 0 10*3/uL (ref 0.0–0.1)
Basophils Relative: 0 %
Eosinophils Absolute: 0 10*3/uL (ref 0.0–0.5)
Eosinophils Relative: 0 %
HCT: 23.8 % — ABNORMAL LOW (ref 34.8–46.6)
Hemoglobin: 8 g/dL — ABNORMAL LOW (ref 11.6–15.9)
Lymphocytes Relative: 8 %
Lymphs Abs: 0.5 10*3/uL — ABNORMAL LOW (ref 0.9–3.3)
MCH: 30.9 pg (ref 25.1–34.0)
MCHC: 33.6 g/dL (ref 31.5–36.0)
MCV: 91.9 fL (ref 79.5–101.0)
Monocytes Absolute: 0.1 10*3/uL (ref 0.1–0.9)
Monocytes Relative: 1 %
Neutro Abs: 5.8 10*3/uL (ref 1.5–6.5)
Neutrophils Relative %: 91 %
Platelet Count: 42 10*3/uL — ABNORMAL LOW (ref 145–400)
RBC: 2.59 MIL/uL — ABNORMAL LOW (ref 3.70–5.45)
RDW: 19.8 % — ABNORMAL HIGH (ref 11.2–14.5)
WBC Count: 6.3 10*3/uL (ref 3.9–10.3)

## 2018-04-03 LAB — CMP (CANCER CENTER ONLY)
ALT: 19 U/L (ref 0–55)
AST: 19 U/L (ref 5–34)
Albumin: 4.2 g/dL (ref 3.5–5.0)
Alkaline Phosphatase: 87 U/L (ref 40–150)
Anion gap: 12 — ABNORMAL HIGH (ref 3–11)
BUN: 21 mg/dL (ref 7–26)
CO2: 25 mmol/L (ref 22–29)
Calcium: 9.6 mg/dL (ref 8.4–10.4)
Chloride: 102 mmol/L (ref 98–109)
Creatinine: 0.9 mg/dL (ref 0.60–1.10)
GFR, Est AFR Am: >60 mL/min (ref >60)
GFR, Estimated: >60 mL/min (ref >60)
Glucose, Bld: 149 mg/dL — ABNORMAL HIGH (ref 70–140)
Potassium: 3.5 mmol/L (ref 3.5–5.1)
Sodium: 139 mmol/L (ref 136–145)
Total Bilirubin: 0.4 mg/dL (ref 0.2–1.2)
Total Protein: 6.8 g/dL (ref 6.4–8.3)

## 2018-04-03 MED ORDER — SODIUM CHLORIDE 0.9% FLUSH
10.0000 mL | Freq: Once | INTRAVENOUS | Status: AC
  Administered 2018-04-03: 10 mL
  Filled 2018-04-03: qty 10

## 2018-04-03 MED ORDER — HEPARIN SOD (PORK) LOCK FLUSH 100 UNIT/ML IV SOLN
500.0000 [IU] | Freq: Once | INTRAVENOUS | Status: AC
  Administered 2018-04-03: 500 [IU] via INTRAVENOUS
  Filled 2018-04-03: qty 5

## 2018-04-03 NOTE — Assessment & Plan Note (Signed)
Unfortunately, she has recurrent, severe pancytopenia I recommend delaying chemotherapy The dose of her chemotherapy has been adjusted many times I will delay treatment by 1 week and in the future, will lengthen the interval between treatment to allow adequate blood count recovery

## 2018-04-03 NOTE — Progress Notes (Signed)
[place] OFFICE PROGRESS NOTE  Patient Care Team: Kelton Pillar, MD as PCP - General (Family Medicine)  ASSESSMENT & PLAN:  Endometrial cancer [facility]) Unfortunately, she has recurrent, severe pancytopenia I recommend delaying chemotherapy The dose of her chemotherapy has been adjusted many times I will delay treatment by 1 week and in the future, will lengthen the interval between treatment to allow adequate blood count recovery   Pancytopenia, acquired ([place]) This is due to significant pancytopenia from chemotherapy and radiation She does not need transfusion I recommend observation only and resume chemotherapy next week if her blood count recovers sufficiently and meet protocol  Peripheral neuropathy due to chemotherapy [facility]) Peripheral neuropathy is stable since dose adjustment For now, I recommend observation only  Chemotherapy-induced nausea She is developing chemotherapy-induced nausea I recommend her to start antiemetics on day 3 after each cycle of treatment   No orders of the defined types were placed in this encounter.   INTERVAL HISTORY: Please see below for problem oriented charting. She returns to be seen prior to cycle 4 of chemotherapy She complained of some recent nausea but no vomiting She has some mild diarrhea without constipation She complained of stable peripheral neuropathy affecting the tips of fingers and toes but not severe She bruises easily She denies recent infection, fever or chills The patient denies any recent signs or symptoms of bleeding such as spontaneous epistaxis, hematuria or hematochezia.  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 - SEE COMMENT. Microscopic  Comment Sections are of a high grade carcinoma with mixed glandular, papillary and solid patterns. The histomorphology and immunostain pattern of ER negative, PR negative, p53 positive and p16 positive is compatible with a serous carcinoma. Internal departmental review obtained (Dr. Lyndon Code) with agreement. The results were called to Dr. Nelda Marseille on on 11/14/17. (MEG:kh 11/14/17)      11/23/2017 Imaging    1. Grossly thickened and heterogeneous endometrium consistent with known endometrial carcinoma. No CT findings to suggest serosal or extra uterine direct extension. No pelvic adenopathy.  2. There are worrisome small peritoneal nodules and a small mesenteric nodal mass in the root of the small bowel mesentery suspicious for metastatic disease. 3. No metastatic pulmonary nodules at the lung bases or evidence of solid abdominal organ metastatic disease.       12/01/2017 Pathology Results    1. Lymph node, sentinel, biopsy, right obturator - ONE BENIGN LYMPH NODE (0/1). 2. Peritoneum, biopsy, peritoneal nodule - NEUROENDOCRINE TUMOR. - SEE COMMENT. 3. Lymph node, sentinel, biopsy, left obturator - SEVEN BENIGN LYMPH NODES (0/7). 4. Uterus +/- tubes/ovaries, neoplastic ENDOMETRIUM. - HIGH GRADE SEROUS CARCINOMA, 3.7 CM. - CARCINOMA FOCALLY INVOLVES OUTER HALF OF MYOMETRIUM. - CERVIX, RIGHT OVARY AND BILATERAL FALLOPIAN TUBES FREE OF TUMOR. LEFT OVARY: - NEUROENDOCRINE TUMOR. - SEE COMMENT. Microscopic Comment 4. ONCOLOGY TABLE-UTERUS, CARCINOMA OR CARCINOSARCOMA Specimen: Uterus with bilateral ovaries and fallopian tubes with right and left obturator sentinel lymph nodes and peritoneal nodule biopsy. Procedure: Hysterectomy with bilateral salpingo-oophorectomy with right and left obturator lymph node biopsies. Lymph node sampling performed: Yes. Specimen integrity: Intact with cervix and body separated. Maximum tumor size: 3.7 cm Histologic type: High grade serous carcinoma. Grade:  III. Myometrial invasion: 1.2 cm where myometrium is 2 cm in  thickness. Cervical stromal involvement: No. Extent of involvement of other organs: High grade serous carcinoma involving endometrium and myometrium. Lymph - vascular invasion: No  Peritoneal washings: Pending. Lymph nodes: Examined: 7 Sentinel 0 Non-sentinel 7 Total Lymph nodes with metastasis: 0 Isolated tumor cells (< 0.2 mm): 0 Micrometastasis: (> 0.2 mm and < 2.0 mm): 0 Macrometastasis: (> 2.0 mm): 0 Extracapsular extension: N/A Pelvic lymph nodes: 0 involved of 8 lymph nodes. Para-aortic lymph nodes: 0 involved of 0 lymph nodes. Other (specify involvement and site): N/A TNM code: pT1b, pN0 FIGO Stage (based on pathologic findings, needs clinical correlation): IB. COMMENT: The endometrial tumor is 3.7 cm in greatest dimension and is a high grade serous carcinoma which focally invades the outer half of the myometrium.  The left ovary shows a 1 cm nodule of neoplasm characterized by sheets of cells with uniform, round to oval nuclei with rare mitotic figures. Iimmunohistochemistry is performed and the left ovarian tumor is positive with cytokeratin AE1/AE3, CD56, chromogranin, synaptophysin and CDX-2. The tumor is negative with inhibin, WT-1, calretinin, CD99, cytokerain 20, cytokeratin 7, estrogen receptor, progesterone receptor, vimentin, and p53. The morphology and immunophenotype are consistent with a low to intermediate grade neuroendocrine tumor and CDX-2 positivity suggests possible gastrointestinal origin. There is a similar 0.6 cm nodule in the paraovarian connective tissue and the peritoneal nodule (part 2) has identical features.       12/01/2017 Surgery    Operation: Robotic-assisted laparoscopic total hysterectomy >250gm with bilateral salpingoophorectomy, SLN biopsy, peritoneal nodule resection  Surgeon: Donaciano Eva  Operative Findings: 14cm uterus, bulky with fibroids, grossly normal tubes and  ovaries. No ascites. Multiple peritoneal nodules covering surface of pelvic peritoneum including near round ligament inserion on the left, sigmoid colon mesentery. There were also peritoneal nodules on the right peritoneal gutter adjacent to cecum which were above the pelvic brim (abdominal).        12/21/2017 Imaging    Prior hysterectomy. No evidence of residual or metastatic carcinoma within the abdomen or pelvis.  Tiny indeterminate bilateral upper lobe pulmonary nodules, likely postinflammatory in etiology although metastatic disease cannot definitely be excluded. Recommend continued follow-up by chest CT in 6 months.  Dominant 2.4 cm left thyroid lobe nodule. Consider thyroid ultrasound for further evaluation.       12/29/2017 Procedure    Successful placement of a right internal jugular approach power injectable Port-A-Cath. The catheter is ready for immediate use.      01/26/2018 Procedure    Well-positioned and normally functioning right IJ port catheter without evidence of complication.       REVIEW OF SYSTEMS:   Constitutional: Denies fevers, chills or abnormal weight loss Eyes: Denies blurriness of vision Ears, nose, mouth, throat, and face: Denies mucositis or sore throat Respiratory: Denies cough, dyspnea or wheezes Cardiovascular: Denies palpitation, chest discomfort or lower extremity swelling Skin: Denies abnormal skin rashes Lymphatics: Denies new lymphadenopathy Neurological:Denies numbness, tingling or new weaknesses Behavioral/Psych: Mood is stable, no new changes  All other systems were reviewed with the patient and are negative.  I have reviewed the past medical history, past surgical history, social history and family history with the patient and they are unchanged from previous note.  ALLERGIES:  is allergic to fish oil; simvastatin; codeine; and sulfa antibiotics.  MEDICATIONS:  Current Outpatient Medications  Medication Sig Dispense Refill  .  acetaminophen (TYLENOL) 500 MG tablet Take 1,000 mg by mouth 2 (two) times daily as needed for moderate pain or headache.    Marland Kitchen  alum & mag hydroxide-simeth (MAALOX/MYLANTA) 200-200-20 MG/5ML suspension Take by mouth every 6 (six) hours as needed for indigestion or heartburn.    . benazepril-hydrochlorthiazide (LOTENSIN HCT) 20-12.5 MG tablet Take 1 tablet by mouth every morning.  1  . calcium carbonate (TUMS - DOSED IN MG ELEMENTAL CALCIUM) 500 MG chewable tablet Chew 2 tablets by mouth daily.    . cephALEXin (KEFLEX) 500 MG capsule Take 1 capsule (500 mg total) by mouth 3 (three) times daily. 21 capsule 0  . dexamethasone (DECADRON) 4 MG tablet Take 5 tabs the night before and 5 tabs the morning of chemotherapy, every 3 weeks, with food (Patient taking differently: Take 4 mg by mouth 2 (two) times daily. Take 3 tabs the night before and 3 tabs the morning of chemotherapy, every 3 weeks, with food) 60 tablet 0  . fluticasone (FLONASE) 50 MCG/ACT nasal spray Place 1 spray into both nostrils daily as needed for allergies or rhinitis.    . hydrocortisone (ANUSOL-HC) 25 MG suppository Place 1 suppository (25 mg total) rectally 2 (two) times daily. 12 suppository 0  . ibuprofen (ADVIL,MOTRIN) 200 MG tablet Take 400-600 mg by mouth 2 (two) times daily as needed for moderate pain.     Marland Kitchen lidocaine-prilocaine (EMLA) cream Apply to affected area once 30 g 3  . loratadine (CLARITIN) 10 MG tablet Take 10 mg by mouth daily.    . Multiple Vitamin (MULTIVITAMIN WITH MINERALS) TABS tablet Take 1 tablet by mouth daily.    . ondansetron (ZOFRAN) 8 MG tablet Take 1 tablet (8 mg total) by mouth every 8 (eight) hours as needed for refractory nausea / vomiting. Start on day 3 after chemo. (Patient not taking: Reported on 02/09/2018) 30 tablet 1  . prochlorperazine (COMPAZINE) 10 MG tablet Take 1 tablet (10 mg total) by mouth every 6 (six) hours as needed (Nausea or vomiting). (Patient not taking: Reported on 01/09/2018) 30  tablet 1  . traMADol (ULTRAM) 50 MG tablet Take 1 tablet (50 mg total) by mouth every 6 (six) hours as needed. 60 tablet 0   No current facility-administered medications for this visit.     PHYSICAL EXAMINATION: ECOG PERFORMANCE STATUS: 1 - Symptomatic but completely ambulatory  Vitals:   04/03/18 0914  BP: 110/60  Pulse: 74  Resp: 18  Temp: 98.1 F (36.7 C)  SpO2: 100%   Filed Weights   04/03/18 0914  Weight: 218 lb 6.4 oz (99.1 kg)    GENERAL:alert, no distress and comfortable SKIN: skin color is pale, texture, turgor are normal, no rashes or significant lesions EYES: normal, Conjunctiva are pale and non-injected, sclera clear OROPHARYNX:no exudate, no erythema and lips, buccal mucosa, and tongue normal  NECK: supple, thyroid normal size, non-tender, without nodularity LYMPH:  no palpable lymphadenopathy in the cervical, axillary or inguinal LUNGS: clear to auscultation and percussion with normal breathing effort HEART: regular rate & rhythm and no murmurs and no lower extremity edema ABDOMEN:abdomen soft, non-tender and normal bowel sounds Musculoskeletal:no cyanosis of digits and no clubbing  NEURO: alert & oriented x 3 with fluent speech, no focal motor/sensory deficits  LABORATORY DATA:  I have reviewed the data as listed    Component Value Date/Time   NA 139 04/03/2018 0816   K 3.5 04/03/2018 0816   CL 102 04/03/2018 0816   CO2 25 04/03/2018 0816   GLUCOSE 149 (H) 04/03/2018 0816   BUN 21 04/03/2018 0816   CREATININE 0.90 04/03/2018 0816   CALCIUM 9.6 04/03/2018 0816  PROT 6.8 04/03/2018 0816   ALBUMIN 4.2 04/03/2018 0816   AST 19 04/03/2018 0816   ALT 19 04/03/2018 0816   ALKPHOS 87 04/03/2018 0816   BILITOT 0.4 04/03/2018 0816   GFRNONAA >60 04/03/2018 0816   GFRAA >60 04/03/2018 0816    No results found for: SPEP, UPEP  Lab Results  Component Value Date   WBC 6.3 04/03/2018   NEUTROABS 5.8 04/03/2018   HGB 8.0 (L) 04/03/2018   HCT 23.8 (L)  04/03/2018   MCV 91.9 04/03/2018   PLT 42 (L) 04/03/2018      Chemistry      Component Value Date/Time   NA 139 04/03/2018 0816   K 3.5 04/03/2018 0816   CL 102 04/03/2018 0816   CO2 25 04/03/2018 0816   BUN 21 04/03/2018 0816   CREATININE 0.90 04/03/2018 0816      Component Value Date/Time   CALCIUM 9.6 04/03/2018 0816   ALKPHOS 87 04/03/2018 0816   AST 19 04/03/2018 0816   ALT 19 04/03/2018 0816   BILITOT 0.4 04/03/2018 0816       All questions were answered. The patient knows to call the clinic with any problems, questions or concerns. No barriers to learning was detected.  I spent 20 minutes counseling the patient face to face. The total time spent in the appointment was 25 minutes and more than 50% was on counseling and review of test results  Heath Lark, MD 04/03/2018 9:49 AM

## 2018-04-03 NOTE — Assessment & Plan Note (Signed)
This is due to significant pancytopenia from chemotherapy and radiation She does not need transfusion I recommend observation only and resume chemotherapy next week if her blood count recovers sufficiently and meet protocol

## 2018-04-03 NOTE — Assessment & Plan Note (Signed)
Peripheral neuropathy is stable since dose adjustment For now, I recommend observation only 

## 2018-04-03 NOTE — Patient Instructions (Signed)
Implanted Port Home Guide An implanted port is a type of central line that is placed under the skin. Central lines are used to provide IV access when treatment or nutrition needs to be given through a person's veins. Implanted ports are used for long-term IV access. An implanted port may be placed because:  You need IV medicine that would be irritating to the small veins in your hands or arms.  You need long-term IV medicines, such as antibiotics.  You need IV nutrition for a long period.  You need frequent blood draws for lab tests.  You need dialysis.  Implanted ports are usually placed in the chest area, but they can also be placed in the upper arm, the abdomen, or the leg. An implanted port has two main parts:  Reservoir. The reservoir is round and will appear as a small, raised area under your skin. The reservoir is the part where a needle is inserted to give medicines or draw blood.  Catheter. The catheter is a thin, flexible tube that extends from the reservoir. The catheter is placed into a large vein. Medicine that is inserted into the reservoir goes into the catheter and then into the vein.  How will I care for my incision site? Do not get the incision site wet. Bathe or shower as directed by your health care provider. How is my port accessed? Special steps must be taken to access the port:  Before the port is accessed, a numbing cream can be placed on the skin. This helps numb the skin over the port site.  Your health care provider uses a sterile technique to access the port. ? Your health care provider must put on a mask and sterile gloves. ? The skin over your port is cleaned carefully with an antiseptic and allowed to dry. ? The port is gently pinched between sterile gloves, and a needle is inserted into the port.  Only "non-coring" port needles should be used to access the port. Once the port is accessed, a blood return should be checked. This helps ensure that the port  is in the vein and is not clogged.  If your port needs to remain accessed for a constant infusion, a clear (transparent) bandage will be placed over the needle site. The bandage and needle will need to be changed every week, or as directed by your health care provider.  Keep the bandage covering the needle clean and dry. Do not get it wet. Follow your health care provider's instructions on how to take a shower or bath while the port is accessed.  If your port does not need to stay accessed, no bandage is needed over the port.  What is flushing? Flushing helps keep the port from getting clogged. Follow your health care provider's instructions on how and when to flush the port. Ports are usually flushed with saline solution or a medicine called heparin. The need for flushing will depend on how the port is used.  If the port is used for intermittent medicines or blood draws, the port will need to be flushed: ? After medicines have been given. ? After blood has been drawn. ? As part of routine maintenance.  If a constant infusion is running, the port may not need to be flushed.  How long will my port stay implanted? The port can stay in for as long as your health care provider thinks it is needed. When it is time for the port to come out, surgery will be   done to remove it. The procedure is similar to the one performed when the port was put in. When should I seek immediate medical care? When you have an implanted port, you should seek immediate medical care if:  You notice a bad smell coming from the incision site.  You have swelling, redness, or drainage at the incision site.  You have more swelling or pain at the port site or the surrounding area.  You have a fever that is not controlled with medicine.  This information is not intended to replace advice given to you by your health care provider. Make sure you discuss any questions you have with your health care provider. Document  Released: 10/04/2005 Document Revised: 03/11/2016 Document Reviewed: 06/11/2013 Elsevier Interactive Patient Education  2017 [employer]  

## 2018-04-03 NOTE — Telephone Encounter (Signed)
Gave patient avs and calendar of upcoming June and July appointments.  °

## 2018-04-03 NOTE — Assessment & Plan Note (Signed)
She is developing chemotherapy-induced nausea I recommend her to start antiemetics on day 3 after each cycle of treatment

## 2018-04-04 ENCOUNTER — Telehealth: Payer: Self-pay | Admitting: Oncology

## 2018-04-04 NOTE — Telephone Encounter (Signed)
Advised patient of appointment on 04/10/18 with Dr. Sondra Come at 4:00 pm.  She verbalized agreement.

## 2018-04-10 ENCOUNTER — Inpatient Hospital Stay: Payer: No Typology Code available for payment source

## 2018-04-10 ENCOUNTER — Other Ambulatory Visit: Payer: Self-pay

## 2018-04-10 ENCOUNTER — Ambulatory Visit: Payer: PRIVATE HEALTH INSURANCE | Admitting: Radiation Oncology

## 2018-04-10 ENCOUNTER — Encounter: Payer: Self-pay | Admitting: Radiation Oncology

## 2018-04-10 ENCOUNTER — Ambulatory Visit
Admission: RE | Admit: 2018-04-10 | Discharge: 2018-04-10 | Disposition: A | Discharge status: Home / Self Care | Payer: PRIVATE HEALTH INSURANCE | Source: Ambulatory Visit | Attending: Radiation Oncology | Admitting: Radiation Oncology

## 2018-04-10 ENCOUNTER — Other Ambulatory Visit: Payer: Self-pay | Admitting: Hematology and Oncology

## 2018-04-10 VITALS — BP 113/60 | HR 71 | Temp 98.2°F | Resp 16

## 2018-04-10 DIAGNOSIS — Z885 Allergy status to narcotic agent status: Secondary | ICD-10-CM | POA: Diagnosis not present

## 2018-04-10 DIAGNOSIS — Z882 Allergy status to sulfonamides status: Secondary | ICD-10-CM | POA: Insufficient documentation

## 2018-04-10 DIAGNOSIS — Y842 Radiological procedure and radiotherapy as the cause of abnormal reaction of the patient, or of later complication, without mention of misadventure at the time of the procedure: Secondary | ICD-10-CM | POA: Diagnosis not present

## 2018-04-10 DIAGNOSIS — C786 Secondary malignant neoplasm of retroperitoneum and peritoneum: Secondary | ICD-10-CM | POA: Insufficient documentation

## 2018-04-10 DIAGNOSIS — C541 Malignant neoplasm of endometrium: Secondary | ICD-10-CM

## 2018-04-10 DIAGNOSIS — Z5111 Encounter for antineoplastic chemotherapy: Secondary | ICD-10-CM | POA: Diagnosis not present

## 2018-04-10 DIAGNOSIS — D3A8 Other benign neuroendocrine tumors: Secondary | ICD-10-CM | POA: Insufficient documentation

## 2018-04-10 LAB — CBC WITH DIFFERENTIAL (CANCER CENTER ONLY)
Basophils Absolute: 0 10*3/uL (ref 0.0–0.1)
Basophils Relative: 0 %
Eosinophils Absolute: 0 10*3/uL (ref 0.0–0.5)
Eosinophils Relative: 0 %
HCT: 25.9 % — ABNORMAL LOW (ref 34.8–46.6)
Hemoglobin: 8.9 g/dL — ABNORMAL LOW (ref 11.6–15.9)
Lymphocytes Relative: 12 %
Lymphs Abs: 0.4 10*3/uL — ABNORMAL LOW (ref 0.9–3.3)
MCH: 32.1 pg (ref 25.1–34.0)
MCHC: 34.5 g/dL (ref 31.5–36.0)
MCV: 92.9 fL (ref 79.5–101.0)
Monocytes Absolute: 0 10*3/uL — ABNORMAL LOW (ref 0.1–0.9)
Monocytes Relative: 1 %
Neutro Abs: 2.7 10*3/uL (ref 1.5–6.5)
Neutrophils Relative %: 87 %
Platelet Count: 85 10*3/uL — ABNORMAL LOW (ref 145–400)
RBC: 2.78 MIL/uL — ABNORMAL LOW (ref 3.70–5.45)
RDW: 20.4 % — ABNORMAL HIGH (ref 11.2–14.5)
WBC Count: 3.1 10*3/uL — ABNORMAL LOW (ref 3.9–10.3)

## 2018-04-10 LAB — CMP (CANCER CENTER ONLY)
ALT: 14 U/L (ref 0–55)
AST: 15 U/L (ref 5–34)
Albumin: 4.2 g/dL (ref 3.5–5.0)
Alkaline Phosphatase: 89 U/L (ref 40–150)
Anion gap: 13 — ABNORMAL HIGH (ref 3–11)
BUN: 21 mg/dL (ref 7–26)
CO2: 24 mmol/L (ref 22–29)
Calcium: 9.6 mg/dL (ref 8.4–10.4)
Chloride: 101 mmol/L (ref 98–109)
Creatinine: 0.83 mg/dL (ref 0.60–1.10)
GFR, Est AFR Am: >60 mL/min (ref >60)
GFR, Estimated: >60 mL/min (ref >60)
Glucose, Bld: 146 mg/dL — ABNORMAL HIGH (ref 70–140)
Potassium: 3.5 mmol/L (ref 3.5–5.1)
Sodium: 138 mmol/L (ref 136–145)
Total Bilirubin: 0.5 mg/dL (ref 0.2–1.2)
Total Protein: 6.9 g/dL (ref 6.4–8.3)

## 2018-04-10 MED ORDER — HEPARIN SOD (PORK) LOCK FLUSH 100 UNIT/ML IV SOLN
500.0000 [IU] | Freq: Once | INTRAVENOUS | Status: AC | PRN
  Administered 2018-04-10: 500 [IU]
  Filled 2018-04-10: qty 5

## 2018-04-10 MED ORDER — DIPHENHYDRAMINE HCL 50 MG/ML IJ SOLN
50.0000 mg | Freq: Once | INTRAMUSCULAR | Status: AC
  Administered 2018-04-10: 50 mg via INTRAVENOUS

## 2018-04-10 MED ORDER — SODIUM CHLORIDE 0.9 % IV SOLN
494.1000 mg | Freq: Once | INTRAVENOUS | Status: AC
  Administered 2018-04-10: 490 mg via INTRAVENOUS
  Filled 2018-04-10: qty 49

## 2018-04-10 MED ORDER — PEGFILGRASTIM 6 MG/0.6ML ~~LOC~~ PSKT
PREFILLED_SYRINGE | SUBCUTANEOUS | Status: AC
Start: 2018-04-10 — End: ?
  Filled 2018-04-10: qty 0.6

## 2018-04-10 MED ORDER — PALONOSETRON HCL INJECTION 0.25 MG/5ML
INTRAVENOUS | Status: AC
  Filled 2018-04-10: qty 5

## 2018-04-10 MED ORDER — SODIUM CHLORIDE 0.9% FLUSH
10.0000 mL | INTRAVENOUS | Status: DC | PRN
  Administered 2018-04-10: 10 mL
  Filled 2018-04-10: qty 10

## 2018-04-10 MED ORDER — PALONOSETRON HCL INJECTION 0.25 MG/5ML
0.2500 mg | Freq: Once | INTRAVENOUS | Status: AC
  Administered 2018-04-10: 0.25 mg via INTRAVENOUS

## 2018-04-10 MED ORDER — DIPHENHYDRAMINE HCL 50 MG/ML IJ SOLN
INTRAMUSCULAR | Status: AC
  Filled 2018-04-10: qty 1

## 2018-04-10 MED ORDER — SODIUM CHLORIDE 0.9 % IV SOLN
Freq: Once | INTRAVENOUS | Status: AC
  Administered 2018-04-10: 10:00:00 via INTRAVENOUS

## 2018-04-10 MED ORDER — SODIUM CHLORIDE 0.9 % IV SOLN
Freq: Once | INTRAVENOUS | Status: AC
  Administered 2018-04-10: 11:00:00 via INTRAVENOUS
  Filled 2018-04-10: qty 5

## 2018-04-10 MED ORDER — PEGFILGRASTIM 6 MG/0.6ML ~~LOC~~ PSKT
6.0000 mg | PREFILLED_SYRINGE | Freq: Once | SUBCUTANEOUS | Status: AC
  Administered 2018-04-10: 6 mg via SUBCUTANEOUS

## 2018-04-10 MED ORDER — FAMOTIDINE IN NACL 20-0.9 MG/50ML-% IV SOLN
INTRAVENOUS | Status: AC
Start: 2018-04-10 — End: ?
  Filled 2018-04-10: qty 50

## 2018-04-10 MED ORDER — FAMOTIDINE IN NACL 20-0.9 MG/50ML-% IV SOLN
20.0000 mg | Freq: Once | INTRAVENOUS | Status: AC
  Administered 2018-04-10: 20 mg via INTRAVENOUS

## 2018-04-10 MED ORDER — PACLITAXEL CHEMO INJECTION 300 MG/50ML
105.0000 mg/m2 | Freq: Once | INTRAVENOUS | Status: AC
  Administered 2018-04-10: 222 mg via INTRAVENOUS
  Filled 2018-04-10: qty 37

## 2018-04-10 NOTE — Progress Notes (Signed)
Radiation Oncology         [phone] ________________________________  Name: Connie Simmons MRN: [id number]  Date: 04/10/2018  DOB: 1946/07/15  Follow-Up Visit Note  CC: Kelton Pillar, MD  Heath Lark, MD    ICD-10-CM   1. Endometrial cancer (HCC) C54.1     Diagnosis:   72 y.o. 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 Iridium 192 was the high dose rate source.  Narrative:  The patient returns today with her son for routine follow-up. She denies any pain. She denies hematuria or dysuria. She denies any nausea, vomiting, diarrhea, or constipation. She denies vaginal bleeding or discharge. She denies rectal bleeding. She reports mild fatigue. She states that her final chemotherapy infusion is on July 22.               ALLERGIES:  is allergic to fish oil; simvastatin; codeine; and sulfa antibiotics.  Meds: Current Outpatient Medications  Medication Sig Dispense Refill  . acetaminophen (TYLENOL) 500 MG tablet Take 1,000 mg by mouth 2 (two) times daily as needed for moderate pain or headache.    Marland Kitchen alum & mag hydroxide-simeth (MAALOX/MYLANTA) 200-200-20 MG/5ML suspension Take by mouth every 6 (six) hours as needed for indigestion or heartburn.    . benazepril-hydrochlorthiazide (LOTENSIN HCT) 20-12.5 MG tablet Take 1 tablet by mouth every morning.  1  . calcium carbonate (TUMS - DOSED IN MG ELEMENTAL CALCIUM) 500 MG chewable tablet Chew 2 tablets by mouth daily.    Marland Kitchen dexamethasone (DECADRON) 4 MG tablet Take 5 tabs the night before and 5 tabs the morning of chemotherapy, every 3 weeks, with food (Patient taking differently: Take 4 mg by mouth 2 (two) times daily. Take 3 tabs the night before and 3 tabs  the morning of chemotherapy, every 3 weeks, with food) 60 tablet 0  . fluticasone (FLONASE) 50 MCG/ACT nasal spray Place 1 spray into both nostrils daily as needed for allergies or rhinitis.    . hydrocortisone (ANUSOL-HC) 25 MG suppository Place 1 suppository (25 mg total) rectally 2 (two) times daily. 12 suppository 0  . ibuprofen (ADVIL,MOTRIN) 200 MG tablet Take 400-600 mg by mouth 2 (two) times daily as needed for moderate pain.     Marland Kitchen lidocaine-prilocaine (EMLA) cream Apply to affected area once 30 g 3  . loratadine (CLARITIN) 10 MG tablet Take 10 mg by mouth daily.    . Multiple Vitamin (MULTIVITAMIN WITH MINERALS) TABS tablet Take 1 tablet by mouth daily.    . ondansetron (ZOFRAN) 8 MG tablet Take 1 tablet (8 mg total) by mouth every 8 (eight) hours as needed for refractory nausea / vomiting. Start on day 3 after chemo. 30 tablet 1  . prochlorperazine (COMPAZINE) 10 MG tablet Take 1 tablet (10 mg total) by mouth every 6 (six) hours as needed (Nausea or vomiting). 30 tablet 1  . traMADol (ULTRAM) 50 MG tablet Take 1 tablet (50 mg total) by mouth every 6 (six) hours as needed. 60 tablet 0  . cephALEXin (KEFLEX) 500 MG capsule Take 1 capsule (500 mg total) by mouth 3 (three) times daily. (Patient not taking: Reported on 04/10/2018) 21 capsule 0   No current facility-administered medications for this encounter.    Facility-Administered Medications  Ordered in Other Encounters  Medication Dose Route Frequency Provider Last Rate Last Dose  . sodium chloride flush (NS) 0.9 % injection 10 mL  10 mL Intracatheter PRN Heath Lark, MD   10 mL at 04/10/18 1543    Physical Findings: The patient is in no acute distress. Patient is alert and oriented.  vitals were not taken for this visit.   Lungs are clear to auscultation bilaterally. Heart has regular rate and rhythm. No palpable cervical, supraclavicular, or axillary adenopathy. Abdomen soft, non-tender, normal bowel sounds.  Pelvic exam not  performed today in light of recent completion of treatment.  Lab Findings: Lab Results  Component Value Date   WBC 3.1 (L) 04/10/2018   HGB 8.9 (L) 04/10/2018   HCT 25.9 (L) 04/10/2018   MCV 92.9 04/10/2018   PLT 85 (L) 04/10/2018    Radiographic Findings: No results found.  Impression:  The patient is recovering from the effects of radiation. Overall seemed to tolerate her vaginal brachytherapy well.   Plan:  Complete last cycle of chemotherapy next month. Follow up with Dr. Denman George in 2 months. Routine follow-up in radiation oncology in 5 months. Patient was given vaginal dilator and instructions on its use.  ____________________________________  Blair Promise, PhD, MD  This document serves as a record of services personally performed by Gery Pray, MD. It was created on his behalf by Rae Lips, a trained medical scribe. The creation of this record is based on the scribe's personal observations and the provider's statements to them. This document has been checked and approved by the attending provider.

## 2018-04-10 NOTE — Patient Instructions (Signed)
[facility] Discharge Instructions for Patients Receiving Chemotherapy  Today you received the following chemotherapy agents:  Taxol, Carboplatin  To help prevent nausea and vomiting after your treatment, we encourage you to take your nausea medication as prescribed.   If you develop nausea and vomiting that is not controlled by your nausea medication, call the clinic.   BELOW ARE SYMPTOMS THAT SHOULD BE REPORTED IMMEDIATELY:  *FEVER GREATER THAN 100.5 F  *CHILLS WITH OR WITHOUT FEVER  NAUSEA AND VOMITING THAT IS NOT CONTROLLED WITH YOUR NAUSEA MEDICATION  *UNUSUAL SHORTNESS OF BREATH  *UNUSUAL BRUISING OR BLEEDING  TENDERNESS IN MOUTH AND THROAT WITH OR WITHOUT PRESENCE OF ULCERS  *URINARY PROBLEMS  *BOWEL PROBLEMS  UNUSUAL RASH Items with * indicate a potential emergency and should be followed up as soon as possible.  Feel free to call the clinic should you have any questions or concerns. The clinic phone number is [phone].  Please show the CHEMO ALERT CARD at check-in to the Emergency Department and triage nurse.   

## 2018-04-10 NOTE — Progress Notes (Signed)
OK to treat with low platelet count as per Dr. Alvy Bimler.

## 2018-04-10 NOTE — Progress Notes (Signed)
Pt here today for follow up with Dr. Sondra Come. Pt denies pain. Pt denies hematuria or dysuria. Pt denies diarrhea/constipation. Pt denies nausea/vomiting. Pt denies vaginal bleeding or discharge. Pt denies rectal bleeding. Pt reports mild fatigue. Pt accompanied by son.   Pt declined having VS taken as right arm had medication patch and the left arm had a "blown" vein. Pt was unwilling to "chance" it with wrist BP measurement. Pt reports BP of 110/60 earlier today.   Wt Readings from Last 3 Encounters:  04/03/18 218 lb 6.4 oz (99.1 kg)  03/14/18 221 lb 4.8 oz (100.4 kg)  03/08/18 221 lb 3.2 oz (100.3 kg)

## 2018-04-17 ENCOUNTER — Ambulatory Visit: Payer: PRIVATE HEALTH INSURANCE | Admitting: Radiation Oncology

## 2018-04-24 ENCOUNTER — Ambulatory Visit: Payer: PRIVATE HEALTH INSURANCE

## 2018-04-24 ENCOUNTER — Ambulatory Visit: Payer: PRIVATE HEALTH INSURANCE | Admitting: Hematology and Oncology

## 2018-04-24 ENCOUNTER — Other Ambulatory Visit: Payer: PRIVATE HEALTH INSURANCE

## 2018-05-08 ENCOUNTER — Encounter: Payer: Self-pay | Admitting: Hematology and Oncology

## 2018-05-08 ENCOUNTER — Inpatient Hospital Stay: Payer: No Typology Code available for payment source | Attending: Gynecologic Oncology

## 2018-05-08 ENCOUNTER — Inpatient Hospital Stay (HOSPITAL_BASED_OUTPATIENT_CLINIC_OR_DEPARTMENT_OTHER): Payer: No Typology Code available for payment source | Admitting: Hematology and Oncology

## 2018-05-08 ENCOUNTER — Inpatient Hospital Stay: Payer: No Typology Code available for payment source

## 2018-05-08 ENCOUNTER — Inpatient Hospital Stay: Payer: No Typology Code available for payment source | Admitting: Nutrition

## 2018-05-08 DIAGNOSIS — E041 Nontoxic single thyroid nodule: Secondary | ICD-10-CM | POA: Diagnosis not present

## 2018-05-08 DIAGNOSIS — D61818 Other pancytopenia: Secondary | ICD-10-CM | POA: Diagnosis not present

## 2018-05-08 DIAGNOSIS — C541 Malignant neoplasm of endometrium: Secondary | ICD-10-CM | POA: Insufficient documentation

## 2018-05-08 DIAGNOSIS — R918 Other nonspecific abnormal finding of lung field: Secondary | ICD-10-CM | POA: Diagnosis not present

## 2018-05-08 DIAGNOSIS — D61810 Antineoplastic chemotherapy induced pancytopenia: Secondary | ICD-10-CM

## 2018-05-08 DIAGNOSIS — Z5111 Encounter for antineoplastic chemotherapy: Secondary | ICD-10-CM | POA: Diagnosis not present

## 2018-05-08 DIAGNOSIS — G620 Drug-induced polyneuropathy: Secondary | ICD-10-CM

## 2018-05-08 DIAGNOSIS — Z95828 Presence of other vascular implants and grafts: Secondary | ICD-10-CM

## 2018-05-08 DIAGNOSIS — Z5189 Encounter for other specified aftercare: Secondary | ICD-10-CM | POA: Diagnosis not present

## 2018-05-08 DIAGNOSIS — C7A8 Other malignant neuroendocrine tumors: Secondary | ICD-10-CM

## 2018-05-08 DIAGNOSIS — T451X5A Adverse effect of antineoplastic and immunosuppressive drugs, initial encounter: Secondary | ICD-10-CM

## 2018-05-08 LAB — CMP (CANCER CENTER ONLY)
ALT: 11 U/L (ref 0–44)
AST: 13 U/L — ABNORMAL LOW (ref 15–41)
Albumin: 4.3 g/dL (ref 3.5–5.0)
Alkaline Phosphatase: 105 U/L (ref 38–126)
Anion gap: 11 (ref 5–15)
BUN: 19 mg/dL (ref 8–23)
CO2: 24 mmol/L (ref 22–32)
Calcium: 9.6 mg/dL (ref 8.9–10.3)
Chloride: 104 mmol/L (ref 98–111)
Creatinine: 0.85 mg/dL (ref 0.44–1.00)
GFR, Est AFR Am: >60 mL/min (ref >60)
GFR, Estimated: >60 mL/min (ref >60)
Glucose, Bld: 131 mg/dL — ABNORMAL HIGH (ref 70–99)
Potassium: 3.9 mmol/L (ref 3.5–5.1)
Sodium: 139 mmol/L (ref 135–145)
Total Bilirubin: 0.2 mg/dL — ABNORMAL LOW (ref 0.3–1.2)
Total Protein: 7 g/dL (ref 6.5–8.1)

## 2018-05-08 LAB — CBC WITH DIFFERENTIAL (CANCER CENTER ONLY)
Basophils Absolute: 0 10*3/uL (ref 0.0–0.1)
Basophils Relative: 0 %
Eosinophils Absolute: 0 10*3/uL (ref 0.0–0.5)
Eosinophils Relative: 0 %
HCT: 25.1 % — ABNORMAL LOW (ref 34.8–46.6)
Hemoglobin: 8.6 g/dL — ABNORMAL LOW (ref 11.6–15.9)
Lymphocytes Relative: 13 %
Lymphs Abs: 0.4 10*3/uL — ABNORMAL LOW (ref 0.9–3.3)
MCH: 33.3 pg (ref 25.1–34.0)
MCHC: 34.2 g/dL (ref 31.5–36.0)
MCV: 97.3 fL (ref 79.5–101.0)
Monocytes Absolute: 0 10*3/uL — ABNORMAL LOW (ref 0.1–0.9)
Monocytes Relative: 1 %
Neutro Abs: 2.7 10*3/uL (ref 1.5–6.5)
Neutrophils Relative %: 86 %
Platelet Count: 212 10*3/uL (ref 145–400)
RBC: 2.58 MIL/uL — ABNORMAL LOW (ref 3.70–5.45)
RDW: 18.5 % — ABNORMAL HIGH (ref 11.2–14.5)
WBC Count: 3.2 10*3/uL — ABNORMAL LOW (ref 3.9–10.3)

## 2018-05-08 MED ORDER — SODIUM CHLORIDE 0.9 % IV SOLN
Freq: Once | INTRAVENOUS | Status: AC
  Administered 2018-05-08: 11:00:00 via INTRAVENOUS
  Filled 2018-05-08: qty 5

## 2018-05-08 MED ORDER — SODIUM CHLORIDE 0.9% FLUSH
10.0000 mL | INTRAVENOUS | Status: DC | PRN
  Administered 2018-05-08: 10 mL
  Filled 2018-05-08: qty 10

## 2018-05-08 MED ORDER — PEGFILGRASTIM 6 MG/0.6ML ~~LOC~~ PSKT
PREFILLED_SYRINGE | SUBCUTANEOUS | Status: AC
  Filled 2018-05-08: qty 0.6

## 2018-05-08 MED ORDER — SODIUM CHLORIDE 0.9 % IV SOLN
Freq: Once | INTRAVENOUS | Status: AC
  Administered 2018-05-08: 10:00:00 via INTRAVENOUS

## 2018-05-08 MED ORDER — SODIUM CHLORIDE 0.9 % IV SOLN
494.1000 mg | Freq: Once | INTRAVENOUS | Status: AC
  Administered 2018-05-08: 490 mg via INTRAVENOUS
  Filled 2018-05-08: qty 49

## 2018-05-08 MED ORDER — DIPHENHYDRAMINE HCL 50 MG/ML IJ SOLN
50.0000 mg | Freq: Once | INTRAMUSCULAR | Status: AC
  Administered 2018-05-08: 50 mg via INTRAVENOUS

## 2018-05-08 MED ORDER — FAMOTIDINE IN NACL 20-0.9 MG/50ML-% IV SOLN
20.0000 mg | Freq: Once | INTRAVENOUS | Status: AC
  Administered 2018-05-08: 20 mg via INTRAVENOUS

## 2018-05-08 MED ORDER — PALONOSETRON HCL INJECTION 0.25 MG/5ML
0.2500 mg | Freq: Once | INTRAVENOUS | Status: AC
  Administered 2018-05-08: 0.25 mg via INTRAVENOUS

## 2018-05-08 MED ORDER — PALONOSETRON HCL INJECTION 0.25 MG/5ML
INTRAVENOUS | Status: AC
  Filled 2018-05-08: qty 5

## 2018-05-08 MED ORDER — SODIUM CHLORIDE 0.9 % IV SOLN
105.0000 mg/m2 | Freq: Once | INTRAVENOUS | Status: AC
  Administered 2018-05-08: 222 mg via INTRAVENOUS
  Filled 2018-05-08: qty 37

## 2018-05-08 MED ORDER — FAMOTIDINE IN NACL 20-0.9 MG/50ML-% IV SOLN
INTRAVENOUS | Status: AC
  Filled 2018-05-08: qty 50

## 2018-05-08 MED ORDER — DIPHENHYDRAMINE HCL 50 MG/ML IJ SOLN
INTRAMUSCULAR | Status: AC
  Filled 2018-05-08: qty 1

## 2018-05-08 MED ORDER — SODIUM CHLORIDE 0.9% FLUSH
10.0000 mL | Freq: Once | INTRAVENOUS | Status: AC
  Administered 2018-05-08: 10 mL
  Filled 2018-05-08: qty 10

## 2018-05-08 MED ORDER — HEPARIN SOD (PORK) LOCK FLUSH 100 UNIT/ML IV SOLN
500.0000 [IU] | Freq: Once | INTRAVENOUS | Status: AC | PRN
Start: 2018-05-08 — End: 2018-05-08
  Administered 2018-05-08: 500 [IU]
  Filled 2018-05-08: qty 5

## 2018-05-08 MED ORDER — PEGFILGRASTIM 6 MG/0.6ML ~~LOC~~ PSKT
6.0000 mg | PREFILLED_SYRINGE | Freq: Once | SUBCUTANEOUS | Status: AC
  Administered 2018-05-08: 6 mg via SUBCUTANEOUS

## 2018-05-08 NOTE — Progress Notes (Signed)
[facility] OFFICE PROGRESS NOTE  Patient Care Team: Kelton Pillar, MD as PCP - General (Family Medicine)  ASSESSMENT & PLAN:  Endometrial cancer [facility]) She will complete final treatment today Her baseline CT abdomen and pelvis before chemotherapy was within normal limits However, CT scan of the chest reveal lung nodule I recommend CT scan of the chest to be repeated in 6 weeks  Thyroid nodule She has asymptomatic thyroid nodule and lung nodule, noted on pretreatment CT imaging I plan to order CT scan of the chest to follow  Pancytopenia, acquired [facility]) This is due to significant pancytopenia from chemotherapy and radiation She does not need transfusion I recommend observation only I suspect her blood count will improve by the next time I see her I warned the patient about risk of bleeding and to watch out for signs and symptoms of anemia  Peripheral neuropathy due to chemotherapy ([place]) Peripheral neuropathy is stable since dose adjustment For now, I recommend observation only   Orders Placed This Encounter  Procedures  . CT Chest Wo Contrast    Standing Status:   Future    Standing Expiration Date:   05/08/2019    Order Specific Question:   ** REASON FOR EXAM (FREE TEXT)    Answer:   abnormal CT chest, lung nodule, endometrial ca    Order Specific Question:   Preferred imaging location?    Answer:   [facility]    Order Specific Question:   Radiology Contrast Protocol - do NOT remove file path    Answer:   [email or web address]    INTERVAL HISTORY: Please see below for problem oriented charting. She returns for cycle 6 of chemotherapy She tolerated last dose well She denies significant persistent peripheral neuropathy No nausea or vomiting No recent cough, chest pain or shortness of breath The patient denies any recent signs or symptoms of bleeding such as spontaneous epistaxis, hematuria or hematochezia.  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 - SEE COMMENT. Microscopic Comment Sections are of a high grade carcinoma with mixed glandular, papillary and solid patterns. The histomorphology and immunostain pattern of ER negative, PR negative, p53 positive and p16 positive is compatible with a serous carcinoma. Internal departmental review obtained (Dr. Lyndon Code) with agreement. The results were called to Dr. Nelda Marseille on on 11/14/17. (MEG:kh 11/14/17)      11/23/2017 Imaging    1. Grossly thickened and heterogeneous endometrium consistent with known endometrial carcinoma. No CT findings to suggest serosal or extra uterine direct extension. No pelvic adenopathy.  2. There are worrisome small peritoneal nodules and a small mesenteric nodal mass in the root of the small bowel mesentery suspicious for metastatic disease. 3. No metastatic pulmonary nodules at the lung bases or evidence of solid abdominal organ metastatic disease.       12/01/2017 Pathology Results    1. Lymph node, sentinel, biopsy, right obturator - ONE BENIGN LYMPH NODE (0/1). 2. Peritoneum, biopsy, peritoneal nodule - NEUROENDOCRINE TUMOR. - SEE COMMENT. 3. Lymph node, sentinel, biopsy, left obturator - SEVEN BENIGN LYMPH NODES (0/7). 4. Uterus +/- tubes/ovaries, neoplastic ENDOMETRIUM. - HIGH GRADE SEROUS CARCINOMA, 3.7 CM. - CARCINOMA FOCALLY INVOLVES OUTER HALF OF MYOMETRIUM. - CERVIX, RIGHT OVARY AND BILATERAL FALLOPIAN TUBES FREE OF  TUMOR. LEFT OVARY: - NEUROENDOCRINE TUMOR. - SEE COMMENT. Microscopic Comment 4. ONCOLOGY TABLE-UTERUS, CARCINOMA OR CARCINOSARCOMA Specimen: Uterus with bilateral ovaries and fallopian tubes with right and left obturator sentinel lymph nodes and peritoneal  nodule biopsy. Procedure: Hysterectomy with bilateral salpingo-oophorectomy with right and left obturator lymph node biopsies. Lymph node sampling performed: Yes. Specimen integrity: Intact with cervix and body separated. Maximum tumor size: 3.7 cm Histologic type: High grade serous carcinoma. Grade: III. Myometrial invasion: 1.2 cm where myometrium is 2 cm in thickness. Cervical stromal involvement: No. Extent of involvement of other organs: High grade serous carcinoma involving endometrium and myometrium. Lymph - vascular invasion: No  Peritoneal washings: Pending. Lymph nodes: Examined: 7 Sentinel 0 Non-sentinel 7 Total Lymph nodes with metastasis: 0 Isolated tumor cells (< 0.2 mm): 0 Micrometastasis: (> 0.2 mm and < 2.0 mm): 0 Macrometastasis: (> 2.0 mm): 0 Extracapsular extension: N/A Pelvic lymph nodes: 0 involved of 8 lymph nodes. Para-aortic lymph nodes: 0 involved of 0 lymph nodes. Other (specify involvement and site): N/A TNM code: pT1b, pN0 FIGO Stage (based on pathologic findings, needs clinical correlation): IB. COMMENT: The endometrial tumor is 3.7 cm in greatest dimension and is a high grade serous carcinoma which focally invades the outer half of the myometrium.  The left ovary shows a 1 cm nodule of neoplasm characterized by sheets of cells with uniform, round to oval nuclei with rare mitotic figures. Iimmunohistochemistry is performed and the left ovarian tumor is positive with cytokeratin AE1/AE3, CD56, chromogranin, synaptophysin and CDX-2. The tumor is negative with inhibin, WT-1, calretinin, CD99, cytokerain 20, cytokeratin 7, estrogen receptor, progesterone receptor, vimentin, and p53. The morphology and immunophenotype are consistent with a low to intermediate grade neuroendocrine tumor and CDX-2 positivity suggests possible gastrointestinal origin. There is a similar 0.6 cm nodule in the paraovarian connective tissue and the peritoneal nodule (part 2) has  identical features.       12/01/2017 Surgery    Operation: Robotic-assisted laparoscopic total hysterectomy >250gm with bilateral salpingoophorectomy, SLN biopsy, peritoneal nodule resection  Surgeon: Donaciano Eva  Operative Findings: 14cm uterus, bulky with fibroids, grossly normal tubes and ovaries. No ascites. Multiple peritoneal nodules covering surface of pelvic peritoneum including near round ligament inserion on the left, sigmoid colon mesentery. There were also peritoneal nodules on the right peritoneal gutter adjacent to cecum which were above the pelvic brim (abdominal).        12/21/2017 Imaging    Prior hysterectomy. No evidence of residual or metastatic carcinoma within the abdomen or pelvis.  Tiny indeterminate bilateral upper lobe pulmonary nodules, likely postinflammatory in etiology although metastatic disease cannot definitely be excluded. Recommend continued follow-up by chest CT in 6 months.  Dominant 2.4 cm left thyroid lobe nodule. Consider thyroid ultrasound for further evaluation.       12/29/2017 Procedure    Successful placement of a right internal jugular approach power injectable Port-A-Cath. The catheter is ready for immediate use.      01/02/2018 -  Chemotherapy    The patient had carboplatin and Taxol       01/26/2018 Procedure    Well-positioned and normally functioning right IJ port catheter without evidence of complication.      02/09/2018 - 03/08/2018 Radiation Therapy    Radiation treatment dates:02/09/2018 - 03/08/2018 Site/dose:The patient received30Gy in 65factions directed at the vaginal cuff. Iridium 192 wasthe high dose rate source.        REVIEW OF SYSTEMS:   Constitutional: Denies fevers, chills or abnormal weight  loss Eyes: Denies blurriness of vision Ears, nose, mouth, throat, and face: Denies mucositis or sore throat Respiratory: Denies cough, dyspnea or wheezes Cardiovascular: Denies palpitation, chest  discomfort or lower extremity swelling Gastrointestinal:  Denies nausea, heartburn or change in bowel habits Skin: Denies abnormal skin rashes Lymphatics: Denies new lymphadenopathy or easy bruising Neurological:Denies numbness, tingling or new weaknesses Behavioral/Psych: Mood is stable, no new changes  All other systems were reviewed with the patient and are negative.  I have reviewed the past medical history, past surgical history, social history and family history with the patient and they are unchanged from previous note.  ALLERGIES:  is allergic to fish oil; simvastatin; codeine; and sulfa antibiotics.  MEDICATIONS:  Current Outpatient Medications  Medication Sig Dispense Refill  . acetaminophen (TYLENOL) 500 MG tablet Take 1,000 mg by mouth 2 (two) times daily as needed for moderate pain or headache.    Marland Kitchen alum & mag hydroxide-simeth (MAALOX/MYLANTA) 200-200-20 MG/5ML suspension Take by mouth every 6 (six) hours as needed for indigestion or heartburn.    . benazepril-hydrochlorthiazide (LOTENSIN HCT) 20-12.5 MG tablet Take 1 tablet by mouth every morning.  1  . calcium carbonate (TUMS - DOSED IN MG ELEMENTAL CALCIUM) 500 MG chewable tablet Chew 2 tablets by mouth daily.    . cephALEXin (KEFLEX) 500 MG capsule Take 1 capsule (500 mg total) by mouth 3 (three) times daily. (Patient not taking: Reported on 04/10/2018) 21 capsule 0  . dexamethasone (DECADRON) 4 MG tablet Take 5 tabs the night before and 5 tabs the morning of chemotherapy, every 3 weeks, with food (Patient taking differently: Take 4 mg by mouth 2 (two) times daily. Take 3 tabs the night before and 3 tabs the morning of chemotherapy, every 3 weeks, with food) 60 tablet 0  . fluticasone (FLONASE) 50 MCG/ACT nasal spray Place 1 spray into both nostrils daily as needed for allergies or rhinitis.    . hydrocortisone (ANUSOL-HC) 25 MG suppository Place 1 suppository (25 mg total) rectally 2 (two) times daily. 12 suppository 0  .  ibuprofen (ADVIL,MOTRIN) 200 MG tablet Take 400-600 mg by mouth 2 (two) times daily as needed for moderate pain.     Marland Kitchen lidocaine-prilocaine (EMLA) cream Apply to affected area once 30 g 3  . loratadine (CLARITIN) 10 MG tablet Take 10 mg by mouth daily.    . Multiple Vitamin (MULTIVITAMIN WITH MINERALS) TABS tablet Take 1 tablet by mouth daily.    . ondansetron (ZOFRAN) 8 MG tablet Take 1 tablet (8 mg total) by mouth every 8 (eight) hours as needed for refractory nausea / vomiting. Start on day 3 after chemo. 30 tablet 1  . prochlorperazine (COMPAZINE) 10 MG tablet Take 1 tablet (10 mg total) by mouth every 6 (six) hours as needed (Nausea or vomiting). 30 tablet 1  . traMADol (ULTRAM) 50 MG tablet Take 1 tablet (50 mg total) by mouth every 6 (six) hours as needed. 60 tablet 0   No current facility-administered medications for this visit.    Facility-Administered Medications Ordered in Other Visits  Medication Dose Route Frequency Provider Last Rate Last Dose  . CARBOplatin (PARAPLATIN) 490 mg in sodium chloride 0.9 % 250 mL chemo infusion  490 mg Intravenous Once Alvy Bimler, Pearley Baranek, MD      . heparin lock flush 100 unit/mL  500 Units Intracatheter Once PRN Alvy Bimler, Cristi Gwynn, MD      . PACLitaxel (TAXOL) 222 mg in sodium chloride 0.9 % 250 mL chemo infusion (> 63m/m2)  105  mg/m2 (Treatment Plan Recorded) Intravenous Once Heath Lark, MD 96 mL/hr at 05/08/18 1135 222 mg at 05/08/18 1135  . pegfilgrastim (NEULASTA ONPRO KIT) injection 6 mg  6 mg Subcutaneous Once Thos Matsumoto, MD      . sodium chloride flush (NS) 0.9 % injection 10 mL  10 mL Intracatheter PRN Alvy Bimler, Jah Alarid, MD        PHYSICAL EXAMINATION: ECOG PERFORMANCE STATUS: 1 - Symptomatic but completely ambulatory  Vitals:   05/08/18 0914  BP: (!) 110/58  Pulse: 67  Resp: 18  Temp: 98 F (36.7 C)  SpO2: 100%   Filed Weights   05/08/18 0914  Weight: 219 lb 6.4 oz (99.5 kg)    GENERAL:alert, no distress and comfortable SKIN: skin color,  texture, turgor are normal, no rashes or significant lesions EYES: normal, Conjunctiva are pink and non-injected, sclera clear OROPHARYNX:no exudate, no erythema and lips, buccal mucosa, and tongue normal  NECK: supple, thyroid normal size, non-tender, without nodularity LYMPH:  no palpable lymphadenopathy in the cervical, axillary or inguinal LUNGS: clear to auscultation and percussion with normal breathing effort HEART: regular rate & rhythm and no murmurs and no lower extremity edema ABDOMEN:abdomen soft, non-tender and normal bowel sounds Musculoskeletal:no cyanosis of digits and no clubbing  NEURO: alert & oriented x 3 with fluent speech, no focal motor/sensory deficits  LABORATORY DATA:  I have reviewed the data as listed    Component Value Date/Time   NA 139 05/08/2018 0829   K 3.9 05/08/2018 0829   CL 104 05/08/2018 0829   CO2 24 05/08/2018 0829   GLUCOSE 131 (H) 05/08/2018 0829   BUN 19 05/08/2018 0829   CREATININE 0.85 05/08/2018 0829   CALCIUM 9.6 05/08/2018 0829   PROT 7.0 05/08/2018 0829   ALBUMIN 4.3 05/08/2018 0829   AST 13 (L) 05/08/2018 0829   ALT 11 05/08/2018 0829   ALKPHOS 105 05/08/2018 0829   BILITOT 0.2 (L) 05/08/2018 0829   GFRNONAA >60 05/08/2018 0829   GFRAA >60 05/08/2018 0829    No results found for: SPEP, UPEP  Lab Results  Component Value Date   WBC 3.2 (L) 05/08/2018   NEUTROABS 2.7 05/08/2018   HGB 8.6 (L) 05/08/2018   HCT 25.1 (L) 05/08/2018   MCV 97.3 05/08/2018   PLT 212 05/08/2018      Chemistry      Component Value Date/Time   NA 139 05/08/2018 0829   K 3.9 05/08/2018 0829   CL 104 05/08/2018 0829   CO2 24 05/08/2018 0829   BUN 19 05/08/2018 0829   CREATININE 0.85 05/08/2018 0829      Component Value Date/Time   CALCIUM 9.6 05/08/2018 0829   ALKPHOS 105 05/08/2018 0829   AST 13 (L) 05/08/2018 0829   ALT 11 05/08/2018 0829   BILITOT 0.2 (L) 05/08/2018 0829      All questions were answered. The patient knows to call  the clinic with any problems, questions or concerns. No barriers to learning was detected.  I spent 15 minutes counseling the patient face to face. The total time spent in the appointment was 20 minutes and more than 50% was on counseling and review of test results  Heath Lark, MD 05/08/2018 1:14 PM

## 2018-05-08 NOTE — Progress Notes (Signed)
72 year old female diagnosed with endometrial cancer. Patient reports this is her last chemotherapy appointment.  Past medical history includes hypertension, hypercholesterolemia, and anemia.  Medications include Decadron, multivitamin, Zofran, and Compazine.  Labs include glucose 131 on July 22.  Height: 64 inches. Weight: 219.4 pounds. Usual body weight: 225 pounds in March. BMI: 37.66.  She was identified to be at risk for malnutrition on the MST secondary to weight loss and poor appetite. Patient's weight has stabilized.   Patient states that she can eat better the second week after chemotherapy. She hopes to continue to improve since today is her last chemotherapy treatment. Patient denies nutrition impact symptoms.  Nutrition diagnosis: Food and nutrition related knowledge deficit related to endometrial cancer and associated treatments as evidenced by no prior need for nutrition related information.  Intervention: Patient educated on strategies for eating small frequent meals and snacks with high-protein foods and adequate calories to maintain current weight. Provided fact sheets. Questions were answered.  Teach back method used.  Contact information provided.  Monitoring, evaluation, goals: Patient will tolerate adequate calories and protein to minimize weight loss.  No follow-up is scheduled.  Food and nutrition related knowledge deficit has resolved.  **Disclaimer: This note was dictated with voice recognition software. Similar sounding words can inadvertently be transcribed and this note may contain transcription errors which may not have been corrected upon publication of note.**

## 2018-05-08 NOTE — Patient Instructions (Signed)
[facility] Discharge Instructions for Patients Receiving Chemotherapy  Today you received the following chemotherapy agents:  Taxol, Carboplatin  To help prevent nausea and vomiting after your treatment, we encourage you to take your nausea medication as prescribed.   If you develop nausea and vomiting that is not controlled by your nausea medication, call the clinic.   BELOW ARE SYMPTOMS THAT SHOULD BE REPORTED IMMEDIATELY:  *FEVER GREATER THAN 100.5 F  *CHILLS WITH OR WITHOUT FEVER  NAUSEA AND VOMITING THAT IS NOT CONTROLLED WITH YOUR NAUSEA MEDICATION  *UNUSUAL SHORTNESS OF BREATH  *UNUSUAL BRUISING OR BLEEDING  TENDERNESS IN MOUTH AND THROAT WITH OR WITHOUT PRESENCE OF ULCERS  *URINARY PROBLEMS  *BOWEL PROBLEMS  UNUSUAL RASH Items with * indicate a potential emergency and should be followed up as soon as possible.  Feel free to call the clinic should you have any questions or concerns. The clinic phone number is [phone].  Please show the CHEMO ALERT CARD at check-in to the Emergency Department and triage nurse.   

## 2018-05-08 NOTE — Assessment & Plan Note (Signed)
She has asymptomatic thyroid nodule and lung nodule, noted on pretreatment CT imaging I plan to order CT scan of the chest to follow

## 2018-05-08 NOTE — Assessment & Plan Note (Signed)
This is due to significant pancytopenia from chemotherapy and radiation She does not need transfusion I recommend observation only I suspect her blood count will improve by the next time I see her I warned the patient about risk of bleeding and to watch out for signs and symptoms of anemia

## 2018-05-08 NOTE — Assessment & Plan Note (Signed)
She will complete final treatment today Her baseline CT abdomen and pelvis before chemotherapy was within normal limits However, CT scan of the chest reveal lung nodule I recommend CT scan of the chest to be repeated in 6 weeks

## 2018-05-08 NOTE — Assessment & Plan Note (Signed)
Peripheral neuropathy is stable since dose adjustment For now, I recommend observation only

## 2018-05-17 ENCOUNTER — Ambulatory Visit: Payer: PRIVATE HEALTH INSURANCE | Admitting: Radiation Oncology

## 2018-05-18 NOTE — Progress Notes (Signed)
FMLA successfully faxed to [employer] at [phone]. Mailed copy to patient address on file.

## 2018-06-22 ENCOUNTER — Ambulatory Visit (HOSPITAL_COMMUNITY)
Admission: RE | Admit: 2018-06-22 | Discharge: 2018-06-22 | Disposition: A | Discharge status: Home / Self Care | Payer: PRIVATE HEALTH INSURANCE | Source: Ambulatory Visit | Attending: Hematology and Oncology | Admitting: Hematology and Oncology

## 2018-06-22 ENCOUNTER — Inpatient Hospital Stay: Payer: PRIVATE HEALTH INSURANCE | Attending: Gynecologic Oncology

## 2018-06-22 ENCOUNTER — Inpatient Hospital Stay: Payer: PRIVATE HEALTH INSURANCE

## 2018-06-22 DIAGNOSIS — C7A8 Other malignant neuroendocrine tumors: Secondary | ICD-10-CM | POA: Diagnosis not present

## 2018-06-22 DIAGNOSIS — I700 Atherosclerosis of aorta: Secondary | ICD-10-CM | POA: Diagnosis not present

## 2018-06-22 DIAGNOSIS — D61818 Other pancytopenia: Secondary | ICD-10-CM | POA: Diagnosis not present

## 2018-06-22 DIAGNOSIS — Z95828 Presence of other vascular implants and grafts: Secondary | ICD-10-CM

## 2018-06-22 DIAGNOSIS — E041 Nontoxic single thyroid nodule: Secondary | ICD-10-CM | POA: Insufficient documentation

## 2018-06-22 DIAGNOSIS — C541 Malignant neoplasm of endometrium: Secondary | ICD-10-CM | POA: Diagnosis present

## 2018-06-22 DIAGNOSIS — R918 Other nonspecific abnormal finding of lung field: Secondary | ICD-10-CM

## 2018-06-22 DIAGNOSIS — G620 Drug-induced polyneuropathy: Secondary | ICD-10-CM | POA: Diagnosis not present

## 2018-06-22 DIAGNOSIS — R911 Solitary pulmonary nodule: Secondary | ICD-10-CM | POA: Insufficient documentation

## 2018-06-22 LAB — CBC WITH DIFFERENTIAL (CANCER CENTER ONLY)
Basophils Absolute: 0 10*3/uL (ref 0.0–0.1)
Basophils Relative: 0 %
Eosinophils Absolute: 0.7 10*3/uL — ABNORMAL HIGH (ref 0.0–0.5)
Eosinophils Relative: 17 %
HCT: 26.1 % — ABNORMAL LOW (ref 34.8–46.6)
Hemoglobin: 8.8 g/dL — ABNORMAL LOW (ref 11.6–15.9)
Lymphocytes Relative: 24 %
Lymphs Abs: 1.1 10*3/uL (ref 0.9–3.3)
MCH: 32.3 pg (ref 25.1–34.0)
MCHC: 33.7 g/dL (ref 31.5–36.0)
MCV: 95.8 fL (ref 79.5–101.0)
Monocytes Absolute: 0.2 10*3/uL (ref 0.1–0.9)
Monocytes Relative: 4 %
Neutro Abs: 2.5 10*3/uL (ref 1.5–6.5)
Neutrophils Relative %: 55 %
Platelet Count: 60 10*3/uL — ABNORMAL LOW (ref 145–400)
RBC: 2.72 MIL/uL — ABNORMAL LOW (ref 3.70–5.45)
RDW: 16.2 % — ABNORMAL HIGH (ref 11.2–14.5)
WBC Count: 4.5 10*3/uL (ref 3.9–10.3)

## 2018-06-22 LAB — CMP (CANCER CENTER ONLY)
ALT: 11 U/L (ref 0–44)
AST: 15 U/L (ref 15–41)
Albumin: 4 g/dL (ref 3.5–5.0)
Alkaline Phosphatase: 96 U/L (ref 38–126)
Anion gap: 9 (ref 5–15)
BUN: 29 mg/dL — ABNORMAL HIGH (ref 8–23)
CO2: 25 mmol/L (ref 22–32)
Calcium: 9.7 mg/dL (ref 8.9–10.3)
Chloride: 106 mmol/L (ref 98–111)
Creatinine: 1.01 mg/dL — ABNORMAL HIGH (ref 0.44–1.00)
GFR, Est AFR Am: >60 mL/min (ref >60)
GFR, Estimated: 55 mL/min — ABNORMAL LOW (ref >60)
Glucose, Bld: 87 mg/dL (ref 70–99)
Potassium: 4 mmol/L (ref 3.5–5.1)
Sodium: 140 mmol/L (ref 135–145)
Total Bilirubin: 0.4 mg/dL (ref 0.3–1.2)
Total Protein: 6.7 g/dL (ref 6.5–8.1)

## 2018-06-22 MED ORDER — HEPARIN SOD (PORK) LOCK FLUSH 100 UNIT/ML IV SOLN
500.0000 [IU] | Freq: Once | INTRAVENOUS | Status: AC
  Administered 2018-06-22: 500 [IU]
  Filled 2018-06-22: qty 5

## 2018-06-22 MED ORDER — SODIUM CHLORIDE 0.9% FLUSH
10.0000 mL | Freq: Once | INTRAVENOUS | Status: AC
  Administered 2018-06-22: 10 mL
  Filled 2018-06-22: qty 10

## 2018-06-23 ENCOUNTER — Encounter: Payer: Self-pay | Admitting: Hematology and Oncology

## 2018-06-23 ENCOUNTER — Telehealth: Payer: Self-pay

## 2018-06-23 ENCOUNTER — Inpatient Hospital Stay (HOSPITAL_BASED_OUTPATIENT_CLINIC_OR_DEPARTMENT_OTHER): Payer: PRIVATE HEALTH INSURANCE | Admitting: Hematology and Oncology

## 2018-06-23 ENCOUNTER — Ambulatory Visit: Payer: PRIVATE HEALTH INSURANCE | Admitting: Hematology and Oncology

## 2018-06-23 ENCOUNTER — Telehealth: Payer: Self-pay | Admitting: Hematology and Oncology

## 2018-06-23 VITALS — BP 114/54 | HR 63 | Temp 98.2°F | Resp 18 | Ht 64.0 in | Wt 216.8 lb

## 2018-06-23 DIAGNOSIS — C7A8 Other malignant neuroendocrine tumors: Secondary | ICD-10-CM | POA: Diagnosis not present

## 2018-06-23 DIAGNOSIS — D61818 Other pancytopenia: Secondary | ICD-10-CM | POA: Diagnosis not present

## 2018-06-23 DIAGNOSIS — E041 Nontoxic single thyroid nodule: Secondary | ICD-10-CM

## 2018-06-23 DIAGNOSIS — G620 Drug-induced polyneuropathy: Secondary | ICD-10-CM

## 2018-06-23 DIAGNOSIS — T451X5A Adverse effect of antineoplastic and immunosuppressive drugs, initial encounter: Secondary | ICD-10-CM

## 2018-06-23 DIAGNOSIS — C541 Malignant neoplasm of endometrium: Secondary | ICD-10-CM | POA: Diagnosis not present

## 2018-06-23 NOTE — Assessment & Plan Note (Signed)
She was diagnosed with neuroendocrine tumor of unknown primary I will contact her gastroenterologist to consider EGD and colonoscopy for evaluation.

## 2018-06-23 NOTE — Assessment & Plan Note (Signed)
She is noted to have an enlarging thyroid nodule on CT imaging I recommend endocrinology evaluation and she agree with the referral

## 2018-06-23 NOTE — Telephone Encounter (Signed)
Left msg for Dr Watt Climes with [facility] to call Dr Alvy Bimler on her cell phone.

## 2018-06-23 NOTE — Telephone Encounter (Signed)
Gave pt avs and calendar  °

## 2018-06-23 NOTE — Assessment & Plan Note (Signed)
She has persistent pancytopenia since discontinuation of chemotherapy She is not symptomatic I plan to recheck it again in 6 weeks

## 2018-06-23 NOTE — Assessment & Plan Note (Signed)
I have reviewed recent CT imaging of the chest with the patient She has no signs of cancer recurrence I will touch base with her GYN oncologist to determine the next imaging studies if appropriate.

## 2018-06-23 NOTE — Assessment & Plan Note (Signed)
She has mild residual peripheral neuropathy from prior treatment but overall it is improving I recommend close observation only

## 2018-06-23 NOTE — Progress Notes (Signed)
[place] OFFICE PROGRESS NOTE  Patient Care Team: Kelton Pillar, MD as PCP - General [facility])  ASSESSMENT & PLAN:  Endometrial cancer [facility]) I have reviewed recent CT imaging of the chest with the patient She has no signs of cancer recurrence I will touch base with her GYN oncologist to determine the next imaging studies if appropriate.  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  Thyroid nodule She is noted to have an enlarging thyroid nodule on CT imaging I recommend endocrinology evaluation and she agree with the referral  Pancytopenia, acquired [facility]) She has persistent pancytopenia since discontinuation of chemotherapy She is not symptomatic I plan to recheck it again in 6 weeks  Peripheral neuropathy due to chemotherapy [facility]) She has mild residual peripheral neuropathy from prior treatment but overall it is improving I recommend close observation only   Orders Placed This Encounter  Procedures  . Ambulatory referral to Endocrinology    Referral Priority:   Routine    Referral Type:   Consultation    Referral Reason:   Specialty Services Required    Referred to Provider:   Delrae Rend, MD    Number of Visits Requested:   1    INTERVAL HISTORY: Please see below for problem oriented charting. She returns for further follow-up Since discontinuation of chemotherapy, she is improving Neuropathy is resolving She denies recent infection, fever or chills The patient denies any recent signs or symptoms of bleeding such as spontaneous epistaxis, hematuria or hematochezia.  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 - SEE COMMENT. Microscopic Comment Sections are of a high grade carcinoma with mixed glandular, papillary and solid patterns. The histomorphology and immunostain pattern of ER negative, PR negative, p53 positive and p16 positive is compatible with a serous carcinoma. Internal departmental review obtained (Dr. Lyndon Code) with agreement. The results were called to Dr. Nelda Marseille on on 11/14/17. (MEG:kh 11/14/17)    11/23/2017 Imaging    1. Grossly thickened and heterogeneous endometrium consistent with known endometrial carcinoma. No CT findings to suggest serosal or extra uterine direct extension. No pelvic adenopathy.  2. There are worrisome small peritoneal nodules and a small mesenteric nodal mass in the root of the small bowel mesentery suspicious for metastatic disease. 3. No metastatic pulmonary nodules at the lung bases or evidence of solid abdominal organ metastatic disease.     12/01/2017 Pathology Results    1. Lymph node, sentinel, biopsy, right obturator - ONE BENIGN LYMPH NODE (0/1). 2. Peritoneum, biopsy, peritoneal nodule - NEUROENDOCRINE TUMOR. - SEE COMMENT. 3. Lymph node, sentinel, biopsy, left obturator - SEVEN BENIGN LYMPH NODES (0/7). 4. Uterus +/- tubes/ovaries, neoplastic ENDOMETRIUM. - HIGH GRADE SEROUS CARCINOMA, 3.7 CM. - CARCINOMA FOCALLY INVOLVES OUTER HALF OF MYOMETRIUM. - CERVIX, RIGHT OVARY AND BILATERAL FALLOPIAN TUBES FREE OF TUMOR. LEFT OVARY: - NEUROENDOCRINE TUMOR. - SEE COMMENT. Microscopic Comment 4. ONCOLOGY TABLE-UTERUS, CARCINOMA OR CARCINOSARCOMA Specimen: Uterus with bilateral ovaries and fallopian tubes with right and left obturator sentinel lymph nodes and peritoneal nodule biopsy. Procedure: Hysterectomy with bilateral salpingo-oophorectomy with right and left obturator lymph node biopsies. Lymph node sampling performed: Yes. Specimen integrity:  Intact with cervix and body  separated. Maximum tumor size: 3.7 cm Histologic type: High grade serous carcinoma. Grade: III. Myometrial invasion: 1.2 cm where myometrium is 2 cm in thickness. Cervical stromal involvement: No. Extent of involvement of other organs: High grade serous carcinoma involving endometrium and myometrium. Lymph - vascular invasion: No  Peritoneal washings: Pending. Lymph nodes: Examined: 7 Sentinel 0 Non-sentinel 7 Total Lymph nodes with metastasis: 0 Isolated tumor cells (< 0.2 mm): 0 Micrometastasis: (> 0.2 mm and < 2.0 mm): 0 Macrometastasis: (> 2.0 mm): 0 Extracapsular extension: N/A Pelvic lymph nodes: 0 involved of 8 lymph nodes. Para-aortic lymph nodes: 0 involved of 0 lymph nodes. Other (specify involvement and site): N/A TNM code: pT1b, pN0 FIGO Stage (based on pathologic findings, needs clinical correlation): IB. COMMENT: The endometrial tumor is 3.7 cm in greatest dimension and is a high grade serous carcinoma which focally invades the outer half of the myometrium.  The left ovary shows a 1 cm nodule of neoplasm characterized by sheets of cells with uniform, round to oval nuclei with rare mitotic figures. Iimmunohistochemistry is performed and the left ovarian tumor is positive with cytokeratin AE1/AE3, CD56, chromogranin, synaptophysin and CDX-2. The tumor is negative with inhibin, WT-1, calretinin, CD99, cytokerain 20, cytokeratin 7, estrogen receptor, progesterone receptor, vimentin, and p53. The morphology and immunophenotype are consistent with a low to intermediate grade neuroendocrine tumor and CDX-2 positivity suggests possible gastrointestinal origin. There is a similar 0.6 cm nodule in the paraovarian connective tissue and the peritoneal nodule (part 2) has identical features.     12/01/2017 Surgery    Operation: Robotic-assisted laparoscopic total hysterectomy >250gm with bilateral salpingoophorectomy, SLN biopsy, peritoneal nodule resection  Surgeon: Donaciano Eva  Operative Findings: 14cm uterus, bulky with fibroids, grossly normal tubes and ovaries. No ascites. Multiple peritoneal nodules covering surface of pelvic peritoneum including near round ligament inserion on the left, sigmoid colon mesentery. There were also peritoneal nodules on the right peritoneal gutter adjacent to cecum which were above the pelvic brim (abdominal).      12/21/2017 Imaging    Prior hysterectomy. No evidence of residual or metastatic carcinoma within the abdomen or pelvis.  Tiny indeterminate bilateral upper lobe pulmonary nodules, likely postinflammatory in etiology although metastatic disease cannot definitely be excluded. Recommend continued follow-up by chest CT in 6 months.  Dominant 2.4 cm left thyroid lobe nodule. Consider thyroid ultrasound for further evaluation.     12/29/2017 Procedure    Successful placement of a right internal jugular approach power injectable Port-A-Cath. The catheter is ready for immediate use.    01/02/2018 - 05/08/2018 Chemotherapy    The patient had carboplatin and Taxol x 6 cycles    01/26/2018 Procedure    Well-positioned and normally functioning right IJ port catheter without evidence of complication.    02/09/2018 - 03/08/2018 Radiation Therapy    Radiation treatment dates:02/09/2018 - 03/08/2018 Site/dose:The patient received30Gy in 34factions directed at the vaginal cuff. Iridium 192 wasthe high dose rate source.     06/22/2018 Imaging    1. No findings suspicious for metastatic disease in the chest. Scattered tiny pulmonary nodules in both lungs are all stable since 12/21/2017 chest CT, most compatible with benign etiology. 2. Mild multinodular goiter with interval growth of dominant 2.8 cm inferior left thyroid lobe nodule. Thyroid ultrasound evaluation is indicated. This follows ACR consensus guidelines: Managing Incidental Thyroid Nodules Detected on Imaging: White Paper of the ACR Incidental Thyroid Findings  Committee. J Am Coll Radiol 2015;  12:143-150.  Aortic Atherosclerosis (ICD10-I70.0).     REVIEW OF SYSTEMS:   Constitutional: Denies fevers, chills or abnormal weight loss Eyes: Denies blurriness of vision Ears, nose, mouth, throat, and face: Denies mucositis or sore throat Respiratory: Denies cough, dyspnea or wheezes Cardiovascular: Denies palpitation, chest discomfort or lower extremity swelling Gastrointestinal:  Denies nausea, heartburn or change in bowel habits Skin: Denies abnormal skin rashes Lymphatics: Denies new lymphadenopathy or easy bruising Neurological:Denies numbness, tingling or new weaknesses Behavioral/Psych: Mood is stable, no new changes  All other systems were reviewed with the patient and are negative.  I have reviewed the past medical history, past surgical history, social history and family history with the patient and they are unchanged from previous note.  ALLERGIES:  is allergic to fish oil; simvastatin; codeine; and sulfa antibiotics.  MEDICATIONS:  Current Outpatient Medications  Medication Sig Dispense Refill  . acetaminophen (TYLENOL) 500 MG tablet Take 1,000 mg by mouth 2 (two) times daily as needed for moderate pain or headache.    Marland Kitchen alum & mag hydroxide-simeth (MAALOX/MYLANTA) 200-200-20 MG/5ML suspension Take by mouth every 6 (six) hours as needed for indigestion or heartburn.    . benazepril-hydrochlorthiazide (LOTENSIN HCT) 20-12.5 MG tablet Take 1 tablet by mouth every morning.  1  . calcium carbonate (TUMS - DOSED IN MG ELEMENTAL CALCIUM) 500 MG chewable tablet Chew 2 tablets by mouth daily.    . cephALEXin (KEFLEX) 500 MG capsule Take 1 capsule (500 mg total) by mouth 3 (three) times daily. (Patient not taking: Reported on 04/10/2018) 21 capsule 0  . dexamethasone (DECADRON) 4 MG tablet Take 5 tabs the night before and 5 tabs the morning of chemotherapy, every 3 weeks, with food (Patient taking differently: Take 4 mg by mouth 2 (two) times  daily. Take 3 tabs the night before and 3 tabs the morning of chemotherapy, every 3 weeks, with food) 60 tablet 0  . fluticasone (FLONASE) 50 MCG/ACT nasal spray Place 1 spray into both nostrils daily as needed for allergies or rhinitis.    . hydrocortisone (ANUSOL-HC) 25 MG suppository Place 1 suppository (25 mg total) rectally 2 (two) times daily. 12 suppository 0  . ibuprofen (ADVIL,MOTRIN) 200 MG tablet Take 400-600 mg by mouth 2 (two) times daily as needed for moderate pain.     Marland Kitchen lidocaine-prilocaine (EMLA) cream Apply to affected area once 30 g 3  . loratadine (CLARITIN) 10 MG tablet Take 10 mg by mouth daily.    . Multiple Vitamin (MULTIVITAMIN WITH MINERALS) TABS tablet Take 1 tablet by mouth daily.    . ondansetron (ZOFRAN) 8 MG tablet Take 1 tablet (8 mg total) by mouth every 8 (eight) hours as needed for refractory nausea / vomiting. Start on day 3 after chemo. 30 tablet 1  . prochlorperazine (COMPAZINE) 10 MG tablet Take 1 tablet (10 mg total) by mouth every 6 (six) hours as needed (Nausea or vomiting). 30 tablet 1  . traMADol (ULTRAM) 50 MG tablet Take 1 tablet (50 mg total) by mouth every 6 (six) hours as needed. 60 tablet 0   No current facility-administered medications for this visit.     PHYSICAL EXAMINATION: ECOG PERFORMANCE STATUS: 1 - Symptomatic but completely ambulatory  Vitals:   06/23/18 0811  BP: (!) 114/54  Pulse: 63  Resp: 18  Temp: 98.2 F (36.8 C)  SpO2: 100%   Filed Weights   06/23/18 0811  Weight: 216 lb 12.8 oz (98.3 kg)    GENERAL:alert, no distress and comfortable SKIN:  skin color, texture, turgor are normal, no rashes or significant lesions NEURO: alert & oriented x 3 with fluent speech, no focal motor/sensory deficits  LABORATORY DATA:  I have reviewed the data as listed    Component Value Date/Time   NA 140 06/22/2018 0849   K 4.0 06/22/2018 0849   CL 106 06/22/2018 0849   CO2 25 06/22/2018 0849   GLUCOSE 87 06/22/2018 0849   BUN 29  (H) 06/22/2018 0849   CREATININE 1.01 (H) 06/22/2018 0849   CALCIUM 9.7 06/22/2018 0849   PROT 6.7 06/22/2018 0849   ALBUMIN 4.0 06/22/2018 0849   AST 15 06/22/2018 0849   ALT 11 06/22/2018 0849   ALKPHOS 96 06/22/2018 0849   BILITOT 0.4 06/22/2018 0849   GFRNONAA 55 (L) 06/22/2018 0849   GFRAA >60 06/22/2018 0849    No results found for: SPEP, UPEP  Lab Results  Component Value Date   WBC 4.5 06/22/2018   NEUTROABS 2.5 06/22/2018   HGB 8.8 (L) 06/22/2018   HCT 26.1 (L) 06/22/2018   MCV 95.8 06/22/2018   PLT 60 (L) 06/22/2018      Chemistry      Component Value Date/Time   NA 140 06/22/2018 0849   K 4.0 06/22/2018 0849   CL 106 06/22/2018 0849   CO2 25 06/22/2018 0849   BUN 29 (H) 06/22/2018 0849   CREATININE 1.01 (H) 06/22/2018 0849      Component Value Date/Time   CALCIUM 9.7 06/22/2018 0849   ALKPHOS 96 06/22/2018 0849   AST 15 06/22/2018 0849   ALT 11 06/22/2018 0849   BILITOT 0.4 06/22/2018 0849       RADIOGRAPHIC STUDIES: I have reviewed multiple imaging study with the patient I have personally reviewed the radiological images as listed and agreed with the findings in the report. Ct Chest Wo Contrast  Result Date: 06/22/2018 CLINICAL DATA:  Endometrial cancer. Follow-up tiny pulmonary nodules. EXAM: CT CHEST WITHOUT CONTRAST TECHNIQUE: Multidetector CT imaging of the chest was performed following the standard protocol without IV contrast. COMPARISON:  12/21/2017 chest CT. FINDINGS: Cardiovascular: Normal heart size. No significant pericardial effusion/thickening. Right internal jugular MediPort terminates at the cavoatrial junction. Minimally atherosclerotic nonaneurysmal thoracic aorta. Normal caliber pulmonary arteries. Mediastinum/Nodes: Mild multinodular goiter with dominant hypodense 2.8 cm inferior left thyroid lobe nodule, increased from 2.4 cm. Unremarkable esophagus. No pathologically enlarged axillary, mediastinal or hilar lymph nodes, noting limited  sensitivity for the detection of hilar adenopathy on this noncontrast study. Lungs/Pleura: No pneumothorax. No pleural effusion. No acute consolidative airspace disease or lung masses. A few scattered tiny pulmonary nodules in both lungs measuring up to 3 mm in the apical right upper lobe (series 5/image 21) are all stable since 12/21/2017 CT. No new significant pulmonary nodules. Upper abdomen: No acute abnormality. Musculoskeletal: No aggressive appearing focal osseous lesions. Moderate thoracic spondylosis. IMPRESSION: 1. No findings suspicious for metastatic disease in the chest. Scattered tiny pulmonary nodules in both lungs are all stable since 12/21/2017 chest CT, most compatible with benign etiology. 2. Mild multinodular goiter with interval growth of dominant 2.8 cm inferior left thyroid lobe nodule. Thyroid ultrasound evaluation is indicated. This follows ACR consensus guidelines: Managing Incidental Thyroid Nodules Detected on Imaging: White Paper of the ACR Incidental Thyroid Findings Committee. J Am Coll Radiol 2015; 12:143-150. Aortic Atherosclerosis (ICD10-I70.0). Electronically Signed   By: Ilona Sorrel M.D.   On: 06/22/2018 16:12    All questions were answered. The patient knows to call the clinic with  any problems, questions or concerns. No barriers to learning was detected.  I spent 20 minutes counseling the patient face to face. The total time spent in the appointment was 30 minutes and more than 50% was on counseling and review of test results  Heath Lark, MD 06/23/2018 10:54 AM

## 2018-07-03 ENCOUNTER — Telehealth: Payer: Self-pay

## 2018-07-03 NOTE — Telephone Encounter (Signed)
She called and left a message. When she last saw Dr. Alvy Bimler she mentioned a thyroid biopsy. She has not heard anything about the biopsy. She is just checking on th status.

## 2018-07-03 NOTE — Telephone Encounter (Signed)
I placed referral to Dr. Buddy Duty as requested  Can you call his office tomorrow to follow? Thanks

## 2018-07-04 ENCOUNTER — Telehealth: Payer: Self-pay

## 2018-07-04 NOTE — Telephone Encounter (Signed)
Referral has been placed to Dr Buddy Duty with [facility].  H&P, labs, most recent scan has been faxed.   Spoke with pt by phone to make her aware.

## 2018-07-10 ENCOUNTER — Telehealth: Payer: Self-pay

## 2018-07-10 NOTE — Telephone Encounter (Signed)
Follow up on referral to Dr Buddy Duty, endocrinologist. Damaris Schooner with [facility] at his office who states it will be at least 1.5 weeks before they can call her to set up appt d/t "20 other referrals ahead of her".  Pt made aware.  Pt does not want to be referred elsewhere at this time d/t she works for [employer]. Pt instructed to let us know if she has not heard from them in 2 wks.

## 2018-07-10 NOTE — Telephone Encounter (Signed)
Spoke with Connie Simmons by phone to inform her per Dr Alvy Bimler it is ok for her to get a flu vaccine and also, she may return to work.  Connie Simmons says she will let us know if she needs a letter written .

## 2018-07-31 ENCOUNTER — Other Ambulatory Visit: Payer: Self-pay | Admitting: Internal Medicine

## 2018-07-31 DIAGNOSIS — E042 Nontoxic multinodular goiter: Secondary | ICD-10-CM

## 2018-08-03 ENCOUNTER — Encounter: Payer: Self-pay | Admitting: Hematology and Oncology

## 2018-08-03 ENCOUNTER — Inpatient Hospital Stay: Payer: PRIVATE HEALTH INSURANCE

## 2018-08-03 ENCOUNTER — Inpatient Hospital Stay: Payer: PRIVATE HEALTH INSURANCE | Attending: Gynecologic Oncology

## 2018-08-03 ENCOUNTER — Telehealth: Payer: Self-pay | Admitting: Hematology and Oncology

## 2018-08-03 ENCOUNTER — Inpatient Hospital Stay (HOSPITAL_BASED_OUTPATIENT_CLINIC_OR_DEPARTMENT_OTHER): Payer: PRIVATE HEALTH INSURANCE | Admitting: Hematology and Oncology

## 2018-08-03 VITALS — BP 115/57 | HR 57 | Temp 98.4°F | Resp 19 | Ht 64.0 in | Wt 214.4 lb

## 2018-08-03 DIAGNOSIS — D61818 Other pancytopenia: Secondary | ICD-10-CM | POA: Diagnosis not present

## 2018-08-03 DIAGNOSIS — C541 Malignant neoplasm of endometrium: Secondary | ICD-10-CM | POA: Insufficient documentation

## 2018-08-03 DIAGNOSIS — C7A8 Other malignant neuroendocrine tumors: Secondary | ICD-10-CM | POA: Insufficient documentation

## 2018-08-03 DIAGNOSIS — E041 Nontoxic single thyroid nodule: Secondary | ICD-10-CM

## 2018-08-03 DIAGNOSIS — Z923 Personal history of irradiation: Secondary | ICD-10-CM

## 2018-08-03 DIAGNOSIS — Z9221 Personal history of antineoplastic chemotherapy: Secondary | ICD-10-CM | POA: Insufficient documentation

## 2018-08-03 DIAGNOSIS — G629 Polyneuropathy, unspecified: Secondary | ICD-10-CM | POA: Insufficient documentation

## 2018-08-03 DIAGNOSIS — Z0000 Encounter for general adult medical examination without abnormal findings: Secondary | ICD-10-CM

## 2018-08-03 DIAGNOSIS — Z95828 Presence of other vascular implants and grafts: Secondary | ICD-10-CM

## 2018-08-03 LAB — CBC WITH DIFFERENTIAL (CANCER CENTER ONLY)
Abs Immature Granulocytes: 0.01 10*3/uL (ref 0.00–0.07)
Basophils Absolute: 0 10*3/uL (ref 0.0–0.1)
Basophils Relative: 1 %
Eosinophils Absolute: 0.8 10*3/uL — ABNORMAL HIGH (ref 0.0–0.5)
Eosinophils Relative: 12 %
HCT: 31.1 % — ABNORMAL LOW (ref 36.0–46.0)
Hemoglobin: 10.2 g/dL — ABNORMAL LOW (ref 12.0–15.0)
Immature Granulocytes: 0 %
Lymphocytes Relative: 22 %
Lymphs Abs: 1.4 10*3/uL (ref 0.7–4.0)
MCH: 30.4 pg (ref 26.0–34.0)
MCHC: 32.8 g/dL (ref 30.0–36.0)
MCV: 92.8 fL (ref 80.0–100.0)
Monocytes Absolute: 0.4 10*3/uL (ref 0.1–1.0)
Monocytes Relative: 7 %
Neutro Abs: 3.6 10*3/uL (ref 1.7–7.7)
Neutrophils Relative %: 58 %
Platelet Count: 215 10*3/uL (ref 150–400)
RBC: 3.35 MIL/uL — ABNORMAL LOW (ref 3.87–5.11)
RDW: 12.9 % (ref 11.5–15.5)
WBC Count: 6.2 10*3/uL (ref 4.0–10.5)
nRBC: 0 % (ref 0.0–0.2)

## 2018-08-03 LAB — CMP (CANCER CENTER ONLY)
ALT: 14 U/L (ref 0–44)
AST: 17 U/L (ref 15–41)
Albumin: 4.2 g/dL (ref 3.5–5.0)
Alkaline Phosphatase: 96 U/L (ref 38–126)
Anion gap: 11 (ref 5–15)
BUN: 26 mg/dL — ABNORMAL HIGH (ref 8–23)
CO2: 26 mmol/L (ref 22–32)
Calcium: 10 mg/dL (ref 8.9–10.3)
Chloride: 102 mmol/L (ref 98–111)
Creatinine: 0.92 mg/dL (ref 0.44–1.00)
GFR, Est AFR Am: >60 mL/min (ref >60)
GFR, Estimated: >60 mL/min (ref >60)
Glucose, Bld: 91 mg/dL (ref 70–99)
Potassium: 4.2 mmol/L (ref 3.5–5.1)
Sodium: 139 mmol/L (ref 135–145)
Total Bilirubin: 0.3 mg/dL (ref 0.3–1.2)
Total Protein: 7.2 g/dL (ref 6.5–8.1)

## 2018-08-03 MED ORDER — SODIUM CHLORIDE 0.9% FLUSH
10.0000 mL | Freq: Once | INTRAVENOUS | Status: AC
  Administered 2018-08-03: 10 mL
  Filled 2018-08-03: qty 10

## 2018-08-03 MED ORDER — INFLUENZA VAC SPLIT HIGH-DOSE 0.5 ML IM SUSY
0.5000 mL | PREFILLED_SYRINGE | Freq: Once | INTRAMUSCULAR | Status: AC
  Administered 2018-08-03: 0.5 mL via INTRAMUSCULAR
  Filled 2018-08-03: qty 0.5

## 2018-08-03 NOTE — Telephone Encounter (Signed)
Gave avs and calendar ° °

## 2018-08-03 NOTE — Assessment & Plan Note (Signed)
Last CT imaging and GI work-up is negative.  I plan CT imaging once a year, next due March 2020

## 2018-08-03 NOTE — Assessment & Plan Note (Signed)
She is undergoing evaluation by endocrinologist.

## 2018-08-03 NOTE — Progress Notes (Signed)
[place] OFFICE PROGRESS NOTE  Patient Care Team: Kelton Pillar, MD as PCP - General [facility])  ASSESSMENT & PLAN:  Endometrial cancer [facility]) I have reviewed recent CT imaging of the chest with the patient She has no signs of cancer recurrence I will touch base with her GYN oncologist to determine the next imaging studies if appropriate.  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  I plan CT imaging once a year, next due March 2020  Thyroid nodule She is undergoing evaluation by endocrinologist.  Pancytopenia, acquired ([place]) Overall improving.  She is not symptomatic.  Observe only. We discussed the importance of preventive care and reviewed the vaccination programs. She does not have any prior allergic reactions to influenza vaccination. She agrees to proceed with influenza vaccination today and we will administer it today at the clinic.   No orders of the defined types were placed in this encounter.   INTERVAL HISTORY: Please see below for problem oriented charting. 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 - SEE COMMENT. Microscopic Comment Sections are of a high grade carcinoma with mixed glandular, papillary and solid patterns. The histomorphology and immunostain pattern of ER negative, PR negative, p53 positive and p16 positive is compatible with a serous carcinoma. Internal departmental review obtained (Dr. Lyndon Code) with agreement. The results were called to Dr. Nelda Marseille on on 11/14/17.  (MEG:kh 11/14/17)    11/23/2017 Imaging    1. Grossly thickened and heterogeneous endometrium consistent with known endometrial carcinoma. No CT findings to suggest serosal or extra uterine direct extension. No pelvic adenopathy.  2. There are worrisome small peritoneal nodules and a small mesenteric nodal mass in the root of the small bowel mesentery suspicious for metastatic disease. 3. No metastatic pulmonary nodules at the lung bases or evidence of solid abdominal organ metastatic disease.     12/01/2017 Pathology Results    1. Lymph node, sentinel, biopsy, right obturator - ONE BENIGN LYMPH NODE (0/1). 2. Peritoneum, biopsy, peritoneal nodule - NEUROENDOCRINE TUMOR. - SEE COMMENT. 3. Lymph node, sentinel, biopsy, left obturator - SEVEN BENIGN LYMPH NODES (0/7). 4. Uterus +/- tubes/ovaries, neoplastic ENDOMETRIUM. - HIGH GRADE SEROUS CARCINOMA, 3.7 CM. - CARCINOMA FOCALLY INVOLVES OUTER HALF OF MYOMETRIUM. - CERVIX, RIGHT OVARY AND BILATERAL FALLOPIAN TUBES FREE OF TUMOR. LEFT OVARY: - NEUROENDOCRINE TUMOR. - SEE COMMENT. Microscopic Comment 4. ONCOLOGY TABLE-UTERUS, CARCINOMA OR CARCINOSARCOMA Specimen: Uterus with bilateral ovaries and fallopian tubes with right and left obturator sentinel lymph nodes and peritoneal nodule biopsy. Procedure: Hysterectomy with bilateral salpingo-oophorectomy with right and left obturator lymph node biopsies. Lymph node sampling performed: Yes. Specimen integrity: Intact with cervix and body separated. Maximum tumor size: 3.7 cm Histologic type: High grade serous carcinoma. Grade: III. Myometrial invasion: 1.2 cm where myometrium is 2 cm in thickness. Cervical stromal involvement: No. Extent of involvement of other organs: High grade serous carcinoma involving endometrium and myometrium. Lymph - vascular invasion: No  Peritoneal washings: Pending. Lymph nodes: Examined: 7 Sentinel 0 Non-sentinel 7 Total Lymph nodes with metastasis:  0 Isolated tumor cells (< 0.2 mm):  0 Micrometastasis: (> 0.2 mm and < 2.0 mm): 0 Macrometastasis: (> 2.0 mm): 0 Extracapsular extension: N/A Pelvic lymph nodes: 0 involved of 8 lymph nodes. Para-aortic lymph nodes: 0 involved of 0 lymph nodes. Other (specify involvement and site): N/A TNM code: pT1b, pN0 FIGO Stage (based on pathologic findings, needs clinical correlation): IB. COMMENT: The endometrial tumor is 3.7 cm in greatest dimension and is a high grade serous carcinoma which focally invades the outer half of the myometrium.  The left ovary shows a 1 cm nodule of neoplasm characterized by sheets of cells with uniform, round to oval nuclei with rare mitotic figures. Iimmunohistochemistry is performed and the left ovarian tumor is positive with cytokeratin AE1/AE3, CD56, chromogranin, synaptophysin and CDX-2. The tumor is negative with inhibin, WT-1, calretinin, CD99, cytokerain 20, cytokeratin 7, estrogen receptor, progesterone receptor, vimentin, and p53. The morphology and immunophenotype are consistent with a low to intermediate grade neuroendocrine tumor and CDX-2 positivity suggests possible gastrointestinal origin. There is a similar 0.6 cm nodule in the paraovarian connective tissue and the peritoneal nodule (part 2) has identical features.     12/01/2017 Surgery    Operation: Robotic-assisted laparoscopic total hysterectomy >250gm with bilateral salpingoophorectomy, SLN biopsy, peritoneal nodule resection  Surgeon: Donaciano Eva  Operative Findings: 14cm uterus, bulky with fibroids, grossly normal tubes and ovaries. No ascites. Multiple peritoneal nodules covering surface of pelvic peritoneum including near round ligament inserion on the left, sigmoid colon mesentery. There were also peritoneal nodules on the right peritoneal gutter adjacent to cecum which were above the pelvic brim (abdominal).      12/21/2017 Imaging    Prior hysterectomy. No evidence of residual or  metastatic carcinoma within the abdomen or pelvis.  Tiny indeterminate bilateral upper lobe pulmonary nodules, likely postinflammatory in etiology although metastatic disease cannot definitely be excluded. Recommend continued follow-up by chest CT in 6 months.  Dominant 2.4 cm left thyroid lobe nodule. Consider thyroid ultrasound for further evaluation.     12/29/2017 Procedure    Successful placement of a right internal jugular approach power injectable Port-A-Cath. The catheter is ready for immediate use.    01/02/2018 - 05/08/2018 Chemotherapy    The patient had carboplatin and Taxol x 6 cycles    01/26/2018 Procedure    Well-positioned and normally functioning right IJ port catheter without evidence of complication.    02/09/2018 - 03/08/2018 Radiation Therapy    Radiation treatment dates:02/09/2018 - 03/08/2018 Site/dose:The patient received30Gy in 57factions directed at the vaginal cuff. Iridium 192 wasthe high dose rate source.     06/22/2018 Imaging    1. No findings suspicious for metastatic disease in the chest. Scattered tiny pulmonary nodules in both lungs are all stable since 12/21/2017 chest CT, most compatible with benign etiology. 2. Mild multinodular goiter with interval growth of dominant 2.8 cm inferior left thyroid lobe nodule. Thyroid ultrasound evaluation is indicated. This follows ACR consensus guidelines: Managing Incidental Thyroid Nodules Detected on Imaging: White Paper of the ACR Incidental Thyroid Findings Committee. J Am Coll Radiol 2015; 12:143-150.  Aortic Atherosclerosis (ICD10-I70.0).     REVIEW OF SYSTEMS:   Constitutional: Denies fevers, chills or abnormal weight loss Eyes: Denies blurriness of vision Ears, nose, mouth, throat, and face: Denies mucositis or sore throat Respiratory: Denies cough, dyspnea or wheezes Cardiovascular: Denies palpitation, chest discomfort or lower extremity swelling Gastrointestinal:  Denies nausea, heartburn or  change in bowel habits Skin: Denies abnormal skin rashes Lymphatics: Denies new lymphadenopathy or easy bruising Neurological:Denies numbness, tingling  or new weaknesses Behavioral/Psych: Mood is stable, no new changes  All other systems were reviewed with the patient and are negative.  I have reviewed the past medical history, past surgical history, social history and family history with the patient and they are unchanged from previous note.  ALLERGIES:  is allergic to fish oil; simvastatin; codeine; and sulfa antibiotics.  MEDICATIONS:  Current Outpatient Medications  Medication Sig Dispense Refill  . acetaminophen (TYLENOL) 500 MG tablet Take 1,000 mg by mouth 2 (two) times daily as needed for moderate pain or headache.    Marland Kitchen alum & mag hydroxide-simeth (MAALOX/MYLANTA) 200-200-20 MG/5ML suspension Take by mouth every 6 (six) hours as needed for indigestion or heartburn.    . benazepril-hydrochlorthiazide (LOTENSIN HCT) 20-12.5 MG tablet Take 1 tablet by mouth every morning.  1  . calcium carbonate (TUMS - DOSED IN MG ELEMENTAL CALCIUM) 500 MG chewable tablet Chew 2 tablets by mouth daily.    . fluticasone (FLONASE) 50 MCG/ACT nasal spray Place 1 spray into both nostrils daily as needed for allergies or rhinitis.    . hydrocortisone (ANUSOL-HC) 25 MG suppository Place 1 suppository (25 mg total) rectally 2 (two) times daily. 12 suppository 0  . ibuprofen (ADVIL,MOTRIN) 200 MG tablet Take 400-600 mg by mouth 2 (two) times daily as needed for moderate pain.     Marland Kitchen lidocaine-prilocaine (EMLA) cream Apply to affected area once 30 g 3  . loratadine (CLARITIN) 10 MG tablet Take 10 mg by mouth daily.    . Multiple Vitamin (MULTIVITAMIN WITH MINERALS) TABS tablet Take 1 tablet by mouth daily.    . ondansetron (ZOFRAN) 8 MG tablet Take 1 tablet (8 mg total) by mouth every 8 (eight) hours as needed for refractory nausea / vomiting. Start on day 3 after chemo. 30 tablet 1  . prochlorperazine  (COMPAZINE) 10 MG tablet Take 1 tablet (10 mg total) by mouth every 6 (six) hours as needed (Nausea or vomiting). 30 tablet 1  . traMADol (ULTRAM) 50 MG tablet Take 1 tablet (50 mg total) by mouth every 6 (six) hours as needed. 60 tablet 0   No current facility-administered medications for this visit.     PHYSICAL EXAMINATION: ECOG PERFORMANCE STATUS: 0 - Asymptomatic  Vitals:   08/03/18 0906  BP: (!) 115/57  Pulse: (!) 57  Resp: 19  Temp: 98.4 F (36.9 C)  SpO2: 100%   Filed Weights   08/03/18 0906  Weight: 214 lb 6.4 oz (97.3 kg)    GENERAL:alert, no distress and comfortable SKIN: skin color, texture, turgor are normal, no rashes or significant lesions EYES: normal, Conjunctiva are pink and non-injected, sclera clear OROPHARYNX:no exudate, no erythema and lips, buccal mucosa, and tongue normal  NECK: supple, thyroid normal size, non-tender, without nodularity LYMPH:  no palpable lymphadenopathy in the cervical, axillary or inguinal LUNGS: clear to auscultation and percussion with normal breathing effort HEART: regular rate & rhythm and no murmurs and no lower extremity edema ABDOMEN:abdomen soft, non-tender and normal bowel sounds Musculoskeletal:no cyanosis of digits and no clubbing  NEURO: alert & oriented x 3 with fluent speech, no focal motor/sensory deficits  LABORATORY DATA:  I have reviewed the data as listed    Component Value Date/Time   NA 139 08/03/2018 0819   K 4.2 08/03/2018 0819   CL 102 08/03/2018 0819   CO2 26 08/03/2018 0819   GLUCOSE 91 08/03/2018 0819   BUN 26 (H) 08/03/2018 0819   CREATININE 0.92 08/03/2018 0819   CALCIUM 10.0  08/03/2018 0819   PROT 7.2 08/03/2018 0819   ALBUMIN 4.2 08/03/2018 0819   AST 17 08/03/2018 0819   ALT 14 08/03/2018 0819   ALKPHOS 96 08/03/2018 0819   BILITOT 0.3 08/03/2018 0819   GFRNONAA >60 08/03/2018 0819   GFRAA >60 08/03/2018 0819    No results found for: SPEP, UPEP  Lab Results  Component Value Date    WBC 6.2 08/03/2018   NEUTROABS 3.6 08/03/2018   HGB 10.2 (L) 08/03/2018   HCT 31.1 (L) 08/03/2018   MCV 92.8 08/03/2018   PLT 215 08/03/2018      Chemistry      Component Value Date/Time   NA 139 08/03/2018 0819   K 4.2 08/03/2018 0819   CL 102 08/03/2018 0819   CO2 26 08/03/2018 0819   BUN 26 (H) 08/03/2018 0819   CREATININE 0.92 08/03/2018 0819      Component Value Date/Time   CALCIUM 10.0 08/03/2018 0819   ALKPHOS 96 08/03/2018 0819   AST 17 08/03/2018 0819   ALT 14 08/03/2018 0819   BILITOT 0.3 08/03/2018 0819       All questions were answered. The patient knows to call the clinic with any problems, questions or concerns. No barriers to learning was detected.  I spent 15 minutes counseling the patient face to face. The total time spent in the appointment was 20 minutes and more than 50% was on counseling and review of test results  Heath Lark, MD 08/03/2018 3:52 PM

## 2018-08-03 NOTE — Assessment & Plan Note (Signed)
Overall improving.  She is not symptomatic.  Observe only. We discussed the importance of preventive care and reviewed the vaccination programs. She does not have any prior allergic reactions to influenza vaccination. She agrees to proceed with influenza vaccination today and we will administer it today at the clinic.

## 2018-08-03 NOTE — Assessment & Plan Note (Signed)
I have reviewed recent CT imaging of the chest with the patient She has no signs of cancer recurrence I will touch base with her GYN oncologist to determine the next imaging studies if appropriate.

## 2018-08-07 ENCOUNTER — Other Ambulatory Visit: Payer: Self-pay | Admitting: Gynecologic Oncology

## 2018-08-07 DIAGNOSIS — C7A8 Other malignant neuroendocrine tumors: Secondary | ICD-10-CM

## 2018-08-07 DIAGNOSIS — C541 Malignant neoplasm of endometrium: Secondary | ICD-10-CM

## 2018-08-07 NOTE — Progress Notes (Signed)
Dr. Denman George to see patient in Jan 2020 with CT AP before.

## 2018-08-08 ENCOUNTER — Telehealth: Payer: Self-pay | Admitting: *Deleted

## 2018-08-08 NOTE — Telephone Encounter (Signed)
Per [name] APP I have scheduled CT scan for January 3rd at 8:30am. Patient will have labs drawn on 12/2 and pick up her contrast then with instructions

## 2018-08-14 ENCOUNTER — Ambulatory Visit
Admission: RE | Admit: 2018-08-14 | Discharge: 2018-08-14 | Disposition: A | Discharge status: Home / Self Care | Payer: PRIVATE HEALTH INSURANCE | Source: Ambulatory Visit | Attending: Internal Medicine | Admitting: Internal Medicine

## 2018-08-14 DIAGNOSIS — E042 Nontoxic multinodular goiter: Secondary | ICD-10-CM

## 2018-09-18 ENCOUNTER — Telehealth: Payer: Self-pay | Admitting: *Deleted

## 2018-09-18 ENCOUNTER — Inpatient Hospital Stay: Payer: PRIVATE HEALTH INSURANCE | Attending: Gynecologic Oncology

## 2018-09-18 ENCOUNTER — Inpatient Hospital Stay: Payer: PRIVATE HEALTH INSURANCE

## 2018-09-18 DIAGNOSIS — C541 Malignant neoplasm of endometrium: Secondary | ICD-10-CM | POA: Diagnosis present

## 2018-09-18 DIAGNOSIS — C7A8 Other malignant neuroendocrine tumors: Secondary | ICD-10-CM

## 2018-09-18 DIAGNOSIS — Z95828 Presence of other vascular implants and grafts: Secondary | ICD-10-CM

## 2018-09-18 LAB — CBC WITH DIFFERENTIAL (CANCER CENTER ONLY)
Abs Immature Granulocytes: 0.01 10*3/uL (ref 0.00–0.07)
Basophils Absolute: 0 10*3/uL (ref 0.0–0.1)
Basophils Relative: 0 %
Eosinophils Absolute: 0.4 10*3/uL (ref 0.0–0.5)
Eosinophils Relative: 5 %
HCT: 31.6 % — ABNORMAL LOW (ref 36.0–46.0)
Hemoglobin: 10.5 g/dL — ABNORMAL LOW (ref 12.0–15.0)
Immature Granulocytes: 0 %
Lymphocytes Relative: 23 %
Lymphs Abs: 1.5 10*3/uL (ref 0.7–4.0)
MCH: 28.8 pg (ref 26.0–34.0)
MCHC: 33.2 g/dL (ref 30.0–36.0)
MCV: 86.6 fL (ref 80.0–100.0)
Monocytes Absolute: 0.4 10*3/uL (ref 0.1–1.0)
Monocytes Relative: 6 %
Neutro Abs: 4.3 10*3/uL (ref 1.7–7.7)
Neutrophils Relative %: 66 %
Platelet Count: 230 10*3/uL (ref 150–400)
RBC: 3.65 MIL/uL — ABNORMAL LOW (ref 3.87–5.11)
RDW: 12.8 % (ref 11.5–15.5)
WBC Count: 6.6 10*3/uL (ref 4.0–10.5)
nRBC: 0 % (ref 0.0–0.2)

## 2018-09-18 LAB — CMP (CANCER CENTER ONLY)
ALT: 11 U/L (ref 0–44)
AST: 18 U/L (ref 15–41)
Albumin: 4 g/dL (ref 3.5–5.0)
Alkaline Phosphatase: 97 U/L (ref 38–126)
Anion gap: 13 (ref 5–15)
BUN: 24 mg/dL — ABNORMAL HIGH (ref 8–23)
CO2: 25 mmol/L (ref 22–32)
Calcium: 9.6 mg/dL (ref 8.9–10.3)
Chloride: 103 mmol/L (ref 98–111)
Creatinine: 1.13 mg/dL — ABNORMAL HIGH (ref 0.44–1.00)
GFR, Est AFR Am: 57 mL/min — ABNORMAL LOW (ref >60)
GFR, Estimated: 49 mL/min — ABNORMAL LOW (ref >60)
Glucose, Bld: 90 mg/dL (ref 70–99)
Potassium: 3.9 mmol/L (ref 3.5–5.1)
Sodium: 141 mmol/L (ref 135–145)
Total Bilirubin: 0.3 mg/dL (ref 0.3–1.2)
Total Protein: 7 g/dL (ref 6.5–8.1)

## 2018-09-18 MED ORDER — SODIUM CHLORIDE 0.9% FLUSH
10.0000 mL | Freq: Once | INTRAVENOUS | Status: AC
  Administered 2018-09-18: 10 mL
  Filled 2018-09-18: qty 10

## 2018-09-18 MED ORDER — HEPARIN SOD (PORK) LOCK FLUSH 100 UNIT/ML IV SOLN
500.0000 [IU] | Freq: Once | INTRAVENOUS | Status: AC
  Administered 2018-09-18: 500 [IU]
  Filled 2018-09-18: qty 5

## 2018-09-18 NOTE — Telephone Encounter (Signed)
Notified of message below

## 2018-09-18 NOTE — Telephone Encounter (Signed)
-----   Message from Heath Lark, MD sent at 09/18/2018 10:49 AM EST ----- Regarding: labs pls let her know labs are better and improving

## 2018-09-19 ENCOUNTER — Other Ambulatory Visit: Payer: Self-pay | Admitting: Internal Medicine

## 2018-09-19 DIAGNOSIS — E042 Nontoxic multinodular goiter: Secondary | ICD-10-CM

## 2018-09-25 ENCOUNTER — Telehealth: Payer: Self-pay

## 2018-09-25 NOTE — Telephone Encounter (Signed)
Told Connie Simmons that her kidney function from 09-18-18 shows that she maybe a little dehydrated.  Told her that she needs to take in at least 64 oz of water or non- caffineated fluids daily for good hydration. Pt states she did well with the fluids while she was receiving her treatments but has not been as diligent since she finished. Told her a copy of this labs work will be sent to her PCP Dr. Kelton Pillar.

## 2018-10-03 ENCOUNTER — Ambulatory Visit
Admission: RE | Admit: 2018-10-03 | Discharge: 2018-10-03 | Disposition: A | Discharge status: Home / Self Care | Payer: PRIVATE HEALTH INSURANCE | Source: Ambulatory Visit | Attending: Internal Medicine | Admitting: Internal Medicine

## 2018-10-03 ENCOUNTER — Other Ambulatory Visit (HOSPITAL_COMMUNITY)
Admission: RE | Admit: 2018-10-03 | Discharge: 2018-10-03 | Disposition: A | Discharge status: Home / Self Care | Payer: PRIVATE HEALTH INSURANCE | Source: Ambulatory Visit | Attending: Interventional Radiology | Admitting: Interventional Radiology

## 2018-10-03 DIAGNOSIS — E041 Nontoxic single thyroid nodule: Secondary | ICD-10-CM | POA: Diagnosis present

## 2018-10-03 DIAGNOSIS — E042 Nontoxic multinodular goiter: Secondary | ICD-10-CM

## 2018-10-20 ENCOUNTER — Ambulatory Visit (HOSPITAL_COMMUNITY)
Admission: RE | Admit: 2018-10-20 | Discharge: 2018-10-20 | Disposition: A | Discharge status: Home / Self Care | Payer: No Typology Code available for payment source | Source: Ambulatory Visit | Attending: Gynecologic Oncology | Admitting: Gynecologic Oncology

## 2018-10-20 ENCOUNTER — Encounter (HOSPITAL_COMMUNITY): Payer: Self-pay

## 2018-10-20 DIAGNOSIS — C541 Malignant neoplasm of endometrium: Secondary | ICD-10-CM | POA: Insufficient documentation

## 2018-10-20 DIAGNOSIS — C7A8 Other malignant neuroendocrine tumors: Secondary | ICD-10-CM

## 2018-10-20 MED ORDER — SODIUM CHLORIDE (PF) 0.9 % IJ SOLN
INTRAMUSCULAR | Status: AC
  Filled 2018-10-20: qty 50

## 2018-10-20 MED ORDER — IOHEXOL 300 MG/ML  SOLN
100.0000 mL | Freq: Once | INTRAMUSCULAR | Status: AC | PRN
  Administered 2018-10-20: 100 mL via INTRAVENOUS

## 2018-10-23 ENCOUNTER — Telehealth: Payer: Self-pay

## 2018-10-23 NOTE — Telephone Encounter (Signed)
Outgoing call to patient regarding CT pelvis and abd per Joylene John NP as " diverticular changes (explained) without evidence of diverticulitis, no evidence of metastatic disease in the abd or pelvis, small low density fluid collection along left pelvic sidewall - post op fluid collection - measuring smaller"  - pt voiced understanding. Reminded her of this Wed appt with Dr Denman George. Pt voiced understanding and no other needs per pt at this time.

## 2018-10-25 ENCOUNTER — Inpatient Hospital Stay: Payer: PRIVATE HEALTH INSURANCE | Attending: Gynecologic Oncology | Admitting: Gynecologic Oncology

## 2018-10-25 ENCOUNTER — Encounter: Payer: Self-pay | Admitting: Gynecologic Oncology

## 2018-10-25 VITALS — BP 115/68 | HR 60 | Temp 98.6°F | Resp 16 | Ht 64.0 in | Wt 219.0 lb

## 2018-10-25 DIAGNOSIS — C7B8 Other secondary neuroendocrine tumors: Secondary | ICD-10-CM | POA: Diagnosis not present

## 2018-10-25 DIAGNOSIS — C7A8 Other malignant neuroendocrine tumors: Secondary | ICD-10-CM | POA: Diagnosis not present

## 2018-10-25 DIAGNOSIS — C541 Malignant neoplasm of endometrium: Secondary | ICD-10-CM

## 2018-10-25 DIAGNOSIS — Z923 Personal history of irradiation: Secondary | ICD-10-CM | POA: Insufficient documentation

## 2018-10-25 DIAGNOSIS — Z90722 Acquired absence of ovaries, bilateral: Secondary | ICD-10-CM | POA: Insufficient documentation

## 2018-10-25 DIAGNOSIS — Z90710 Acquired absence of both cervix and uterus: Secondary | ICD-10-CM | POA: Diagnosis not present

## 2018-10-25 DIAGNOSIS — Z9221 Personal history of antineoplastic chemotherapy: Secondary | ICD-10-CM | POA: Diagnosis not present

## 2018-10-25 NOTE — Progress Notes (Signed)
Follow-up Note: Gyn-Onc  Consult was initially requested by Dr. Nelda Marseille for the evaluation of Windell Norfolk 73 y.o. female  CC:  Chief Complaint  Patient presents with  . Endometrial cancer [facility])    Assessment/Plan:  Ms. JILLYAN PLITT  is a 73 y.o.  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 S/p staging surgery on 12/01/17. MSI high endometrial tumor. Adjuvant carboplatin and paclitaxel x 6 and vaginal brachytherapy completed on 05/08/18.  Complete clinical response on imaging.  Annual CT imaging to follow-up neuroendocrine tumor -recommend CT in January, 2021 unless new symptom develop.  She will see Dr Sondra Come in 3 months and me back in 6 months.  Will consider removing port after July, 2020 if remains disease free.   HPI: Nataki Mccrumb is a 72 year old P2 who is seen in consultation at the request of Dr Nelda Marseille for serous endometrial cancer in the setting of obesity (BMI 39kg/m2).  The patient experienced vaginal bleeding for approximately 6 months though initially it was only light spotting. In January, 2019 it became heavier. She was seen at [facility] on 11/04/17 and prescribed Megace. She was then seen by Dr Nelda Marseille and an endometrial biopsy was performed on 11/10/17 which showed high grade serous endometrial cancer. TVUS showed a uterus measuring 6.3x11.2x7cm. The endometrium was 4cm thick.   The patient has a family history of a grandfather with colon cancer. She has had 2 prior SVD's and an open cholecystectomy.   On 11/23/17 preoperative CT abdo/pelvis showed signs of metastatic peritoneal disease with grossly thickened and heterogeneous endometrium consistent with known endometrial carcinoma. No CT findings to suggest serosal or extra uterine direct extension. No pelvic adenopathy. There are worrisome small peritoneal nodules and a small mesenteric nodal mass in the root of  the small bowel mesentery suspicious for metastatic disease. No metastatic pulmonary nodules at the lung bases or evidence of solid abdominal organ metastatic disease. On 12/01/17 she underwent robot assisted total hysterectomy, BSO, SLN biopsy and debulking of larger peritoneal implants. Final pathology revealed a 3.5cm high grade serous endometrial cancer with outer half myometrial invasion (1.2 of 2cm), negative LVSI and negative lymph nodes. The tumor was MSI high. There was an incidentally found neuroendocrine tumor on the left ovary with the following description:  The left ovary shows a 1 cm nodule of neoplasm characterized by sheets of cells with uniform, round to oval nuclei with rare mitotic figures. Iimmunohistochemistry is performed and the left ovarian tumor is positive with cytokeratin AE1/AE3, CD56, chromogranin, synaptophysin and CDX-2. The tumor is negative with inhibin, WT-1, calretinin, CD99, cytokerain 20, cytokeratin 7, estrogen receptor, progesterone receptor, vimentin, and p53. The morphology and immunophenotype are consistent with a low to intermediate grade neuroendocrine tumor and CDX-2 positivity suggests possible gastrointestinal origin. There is a similar 0.6 cm nodule in the paraovarian connective tissue and the peritoneal nodule (part 2) has identical features. The peritoneal nodules that were resected were also consistent with neuroendocrine tumor (not endometrial serous cancer).  Her case was reviewed at multidisciplinary tumor board and she was determined to have a primary ovarian neuroendocrine tumor and separate stage IB high grade serous endometrial cancer. She was recommended to undergo repeat staging imaging, GI evaluation, and then adjuvant therapy for the endometrial cancer.   Follow-up post-operative imaging of the chest abdomen and pelvis on 12/21/17 showed a 2.4cm left thyroid lobe nodule. A few tiny scattered  bilateral upper lobe pulmonary nodules are seen measuring  less than 19m, which are indeterminate. There was no residual or metastatic carcinoma seen within the abdomen or pelvis.  Given the high risk uterine pathology she was recommended to receive 6 cycles of adjuvant carb/taxol chemotherapy and vaginal brachytherapy.  Her last colonoscopy was 2018 (benign polyp).  Interval Hx: She completed 6 cycles of adjuvant carboplatin and paclitaxel chemotherapy on 05/08/18. Vaginal brachytherapy (30Gy in 5 fractions) was completed on 03/08/18.  Post treatment imaging with CT abd/pelvis on 10/23/17 showed no evidence of persistent/recurrent disease. She had diverticulosis seen.   Current Meds:   Outpatient Encounter Medications as of 10/25/2018  Medication Sig  . acetaminophen (TYLENOL) 500 MG tablet Take 1,000 mg by mouth 2 (two) times daily as needed for moderate pain or headache.  . albuterol (PROVENTIL HFA;VENTOLIN HFA) 108 (90 Base) MCG/ACT inhaler Inhale 2 puffs into the lungs every 6 (six) hours as needed for wheezing or shortness of breath.  .Marland Kitchenalum & mag hydroxide-simeth (MAALOX/MYLANTA) 200-200-20 MG/5ML suspension Take by mouth every 6 (six) hours as needed for indigestion or heartburn.  . benazepril-hydrochlorthiazide (LOTENSIN HCT) 20-12.5 MG tablet Take 1 tablet by mouth every morning.  . calcium carbonate (TUMS - DOSED IN MG ELEMENTAL CALCIUM) 500 MG chewable tablet Chew 2 tablets by mouth daily.  . fluticasone (FLONASE) 50 MCG/ACT nasal spray Place 1 spray into both nostrils daily as needed for allergies or rhinitis.  .Marland KitchenguaiFENesin (MUCINEX) 600 MG 12 hr tablet Take 600 mg by mouth 2 (two) times daily.  . hydrocortisone (ANUSOL-HC) 25 MG suppository Place 1 suppository (25 mg total) rectally 2 (two) times daily. (Patient taking differently: Place 25 mg rectally as needed. )  . ibuprofen (ADVIL,MOTRIN) 200 MG tablet Take 400-600 mg by mouth 2 (two) times daily as needed for moderate pain.   .Marland Kitchenlidocaine-prilocaine (EMLA) cream Apply to affected area  once  . Multiple Vitamin (MULTIVITAMIN WITH MINERALS) TABS tablet Take 1 tablet by mouth daily.  . ondansetron (ZOFRAN) 8 MG tablet Take 1 tablet (8 mg total) by mouth every 8 (eight) hours as needed for refractory nausea / vomiting. Start on day 3 after chemo. (Patient not taking: Reported on 10/25/2018)  . prochlorperazine (COMPAZINE) 10 MG tablet Take 1 tablet (10 mg total) by mouth every 6 (six) hours as needed (Nausea or vomiting). (Patient not taking: Reported on 10/25/2018)  . traMADol (ULTRAM) 50 MG tablet Take 1 tablet (50 mg total) by mouth every 6 (six) hours as needed. (Patient not taking: Reported on 10/25/2018)  . [DISCONTINUED] loratadine (CLARITIN) 10 MG tablet Take 10 mg by mouth daily.   No facility-administered encounter medications on file as of 10/25/2018.     Allergy:  Allergies  Allergen Reactions  . Fish Oil Nausea Only    Abdominal pain  . Simvastatin Other (See Comments)    Abdominal pain  . Codeine Itching  . Sulfa Antibiotics Itching and Rash    Social Hx:   Social History   Socioeconomic History  . Marital status: Divorced    Spouse name: Not on file  . Number of children: Not on file  . Years of education: Not on file  . Highest education level: Not on file  Occupational History  . Occupation: rHaematologist Social Needs  . Financial resource strain: Not on file  . Food insecurity:    Worry: Not on file    Inability: Not on file  . Transportation needs:    Medical:  Not on file    Non-medical: Not on file  Tobacco Use  . Smoking status: Never Smoker  . Smokeless tobacco: Never Used  Substance and Sexual Activity  . Alcohol use: No  . Drug use: No  . Sexual activity: Not on file  Lifestyle  . Physical activity:    Days per week: Not on file    Minutes per session: Not on file  . Stress: Not on file  Relationships  . Social connections:    Talks on phone: Not on file    Gets together: Not on file    Attends religious service: Not on file     Active member of club or organization: Not on file    Attends meetings of clubs or organizations: Not on file    Relationship status: Not on file  . Intimate partner violence:    Fear of current or ex partner: Not on file    Emotionally abused: Not on file    Physically abused: Not on file    Forced sexual activity: Not on file  Other Topics Concern  . Not on file  Social History Narrative  . Not on file    Past Surgical Hx:  Past Surgical History:  Procedure Laterality Date  . CHOLECYSTECTOMY OPEN  1974  . DILATION AND CURETTAGE OF UTERUS  1980s  . IR CV LINE INJECTION  01/26/2018  . IR FLUORO GUIDE PORT INSERTION RIGHT  12/29/2017  . IR US GUIDE VASC ACCESS RIGHT  12/29/2017  . ROBOTIC ASSISTED TOTAL HYSTERECTOMY WITH BILATERAL SALPINGO OOPHERECTOMY Bilateral 12/01/2017   Procedure: XI ROBOTIC ASSISTED TOTAL HYSTERECTOMY WITH BILATERAL SALPINGO OOPHORECTOMY WITH SENTINAL LYMPH NODES;  Surgeon: Everitt Amber, MD;  Location: [facility];  Service: Gynecology;  Laterality: Bilateral;  . STRABISMUS SURGERY  age 58   unilateral    Past Medical Hx:  Past Medical History:  Diagnosis Date  . Anemia   . Dental bridge present    front upper tooth  . endometrial ca dx'd 10/2017  . Fibroid   . Hypercholesteremia   . Hypertension    followed by pcp  . Nocturia   . PONV (postoperative nausea and vomiting)     Past Gynecological History:  SVD x 2, fibroids No LMP recorded. Patient has had a hysterectomy.  Family Hx:  Family History  Problem Relation Age of Onset  . Colon cancer Other        paternal grandfather  . Cancer Father        prostate ca    Review of Systems:  Constitutional  Feels well,    ENT Normal appearing ears and nares bilaterally Skin/Breast  No rash, sores, jaundice, itching, dryness Cardiovascular  No chest pain, shortness of breath, or edema  Pulmonary  No cough or wheeze.  Gastro Intestinal  No nausea, vomitting, or diarrhoea. No bright red blood per  rectum, no abdominal pain, change in bowel movement, or constipation.  Genito Urinary  No frequency, urgency, dysuria, no bleeding Musculo Skeletal  No myalgia, arthralgia, joint swelling or pain  Neurologic  No weakness, numbness, change in gait,  Psychology  No depression, anxiety, insomnia.   Vitals:  Blood pressure 115/68, pulse 60, temperature 98.6 F (37 C), temperature source Oral, resp. rate 16, height '5\' 4"'  (1.626 m), weight 219 lb (99.3 kg), SpO2 99 %.  Physical Exam: WD in NAD Neck  Supple NROM, without any enlargements.  Lymph Node Survey No cervical supraclavicular or inguinal adenopathy Cardiovascular  Pulse normal  rate, regularity and rhythm. S1 and S2 normal.  Lungs  Clear to auscultation bilateraly, without wheezes/crackles/rhonchi. Good air movement.  Skin  No rash/lesions/breakdown  Psychiatry  Alert and oriented to person, place, and time  Abdomen  Normoactive bowel sounds, abdomen soft, non-tender and obese without evidence of hernia. Incisions well healed. No masses. Back No CVA tenderness Genito Urinary  Vulva/vagina: Normal external female genitalia.  No lesions. No discharge or bleeding.  Bladder/urethra:  No lesions or masses, well supported bladder  Vagina: vaginal cuff smooth and regular with no masses Rectal  deferred Extremities  No bilateral cyanosis, clubbing or edema.   Thereasa Solo, MD  10/25/2018, 4:35 PM

## 2018-10-25 NOTE — Patient Instructions (Signed)
Please notify Dr Denman George at phone number [phone] if you notice vaginal bleeding, new pelvic or abdominal pains, bloating, feeling full easy, or a change in bladder or bowel function.   Please contact Dr Serita Grit office (at [phone]) in April, 2020 to request an appointment with her for July, 2020.

## 2018-10-26 ENCOUNTER — Telehealth: Payer: Self-pay | Admitting: Oncology

## 2018-10-26 NOTE — Telephone Encounter (Signed)
Connie Simmons and asked if she would like to follow up with Dr. Alvy Bimler or her GI doctor for the neuroendocrine tumor and future CT scan in January, 2021.  She said she would like to follow up with Dr. Alvy Bimler.  Also discussed her porta cath and she would like to keep it in until after the CT in 2021.  Advised her that we will be making more flush appointments and she requested 8 am appointments due to her work.

## 2018-10-30 ENCOUNTER — Telehealth: Payer: Self-pay | Admitting: *Deleted

## 2018-10-30 NOTE — Telephone Encounter (Signed)
Called and spoke with the patient. Gave her the appt date/time for Dr. Sondra Come in April

## 2018-11-15 ENCOUNTER — Inpatient Hospital Stay: Payer: PRIVATE HEALTH INSURANCE

## 2018-11-15 DIAGNOSIS — C541 Malignant neoplasm of endometrium: Secondary | ICD-10-CM | POA: Diagnosis not present

## 2018-11-15 DIAGNOSIS — C7A8 Other malignant neuroendocrine tumors: Secondary | ICD-10-CM

## 2018-11-15 DIAGNOSIS — Z95828 Presence of other vascular implants and grafts: Secondary | ICD-10-CM

## 2018-11-15 MED ORDER — SODIUM CHLORIDE 0.9% FLUSH
10.0000 mL | Freq: Once | INTRAVENOUS | Status: AC
  Administered 2018-11-15: 10 mL
  Filled 2018-11-15: qty 10

## 2018-11-15 MED ORDER — HEPARIN SOD (PORK) LOCK FLUSH 100 UNIT/ML IV SOLN
500.0000 [IU] | Freq: Once | INTRAVENOUS | Status: AC
  Administered 2018-11-15: 500 [IU]
  Filled 2018-11-15: qty 5

## 2018-11-15 NOTE — Patient Instructions (Signed)

## 2018-11-17 IMAGING — CT CT ABD-PELV W/ CM
2 of 5 series · 16 of 46 positions shown, 18 images · IV contrast (ISOVUE 300)
Comparison: None.

CLINICAL DATA: Newly diagnosed endometrial cancer. Abdominal
cramping and vaginal bleeding for 1 month.

EXAM:
CT ABDOMEN AND PELVIS WITH CONTRAST
TECHNIQUE: Multidetector CT imaging of the abdomen and pelvis was performed
using the standard protocol following bolus administration of
intravenous contrast.
CONTRAST:  100mL [id number]-BHH IOPAMIDOL (MZ7NHA-BHH) INJECTION 61%

[Series 2: axial st · axial · 0.77mm/px · z∈[+1102,+1462]mm · 13 of 86 slices shown, 15 images]
[im 7/86  soft-tissue]
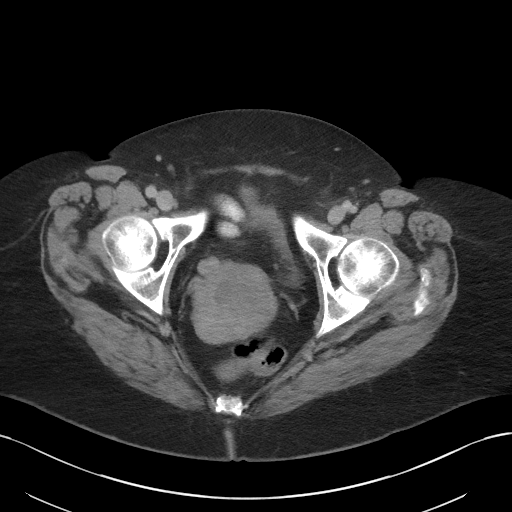
[im 7/86  bone]
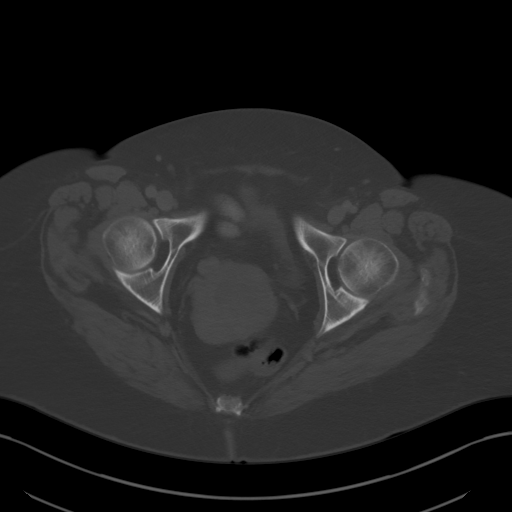
[im 13/86  soft-tissue]
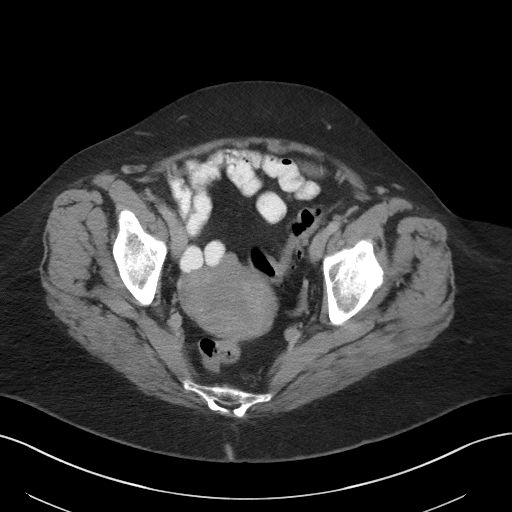
[im 19/86  soft-tissue]
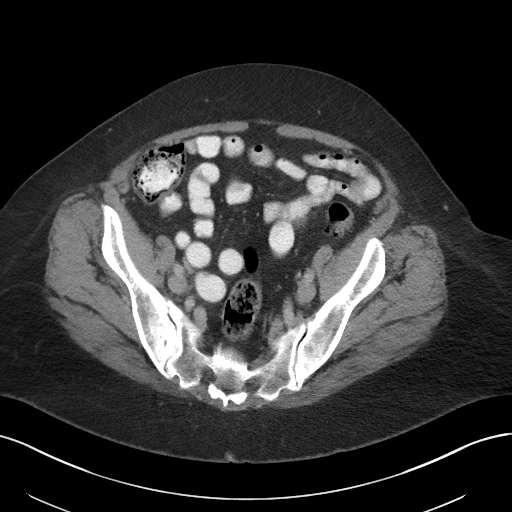
[im 25/86  soft-tissue]
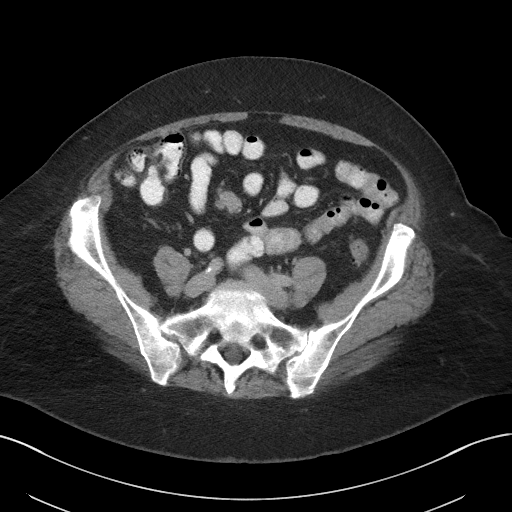
[im 31/86  soft-tissue]
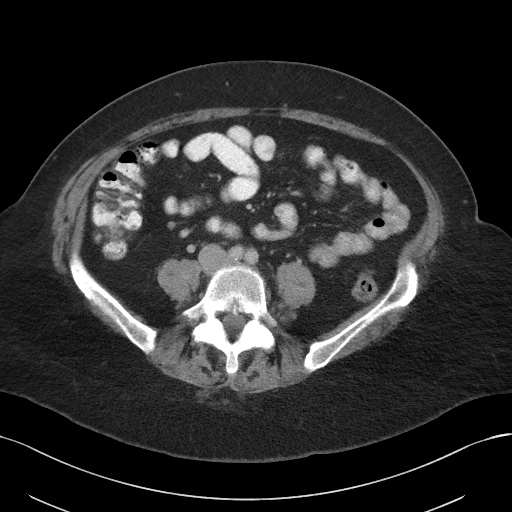
[im 37/86  soft-tissue]
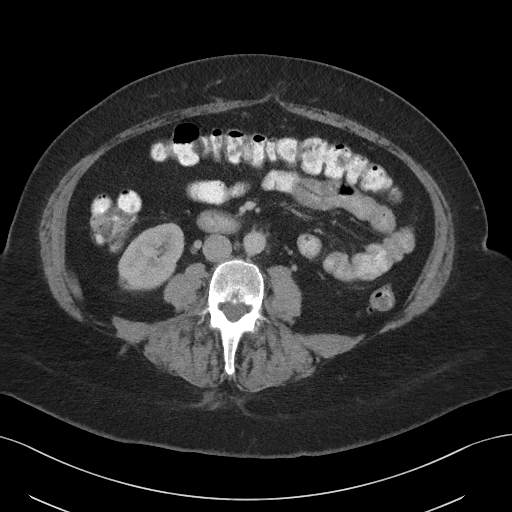
[im 43/86  soft-tissue]
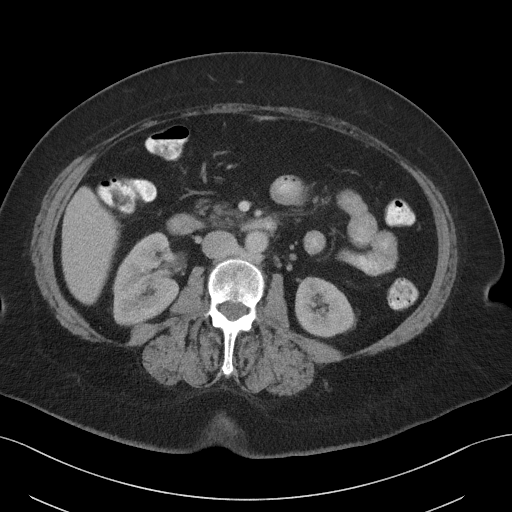
[im 49/86  soft-tissue]
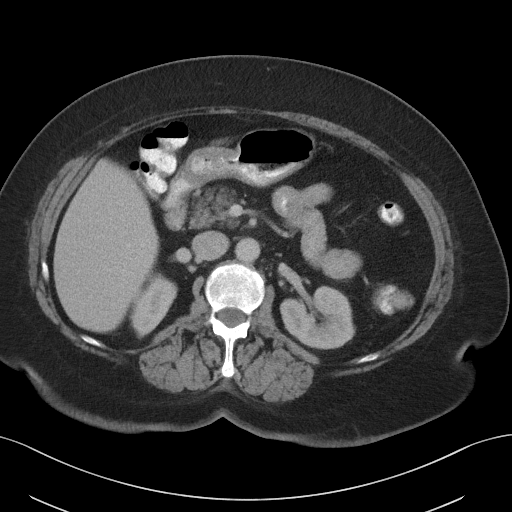
[im 55/86  soft-tissue]
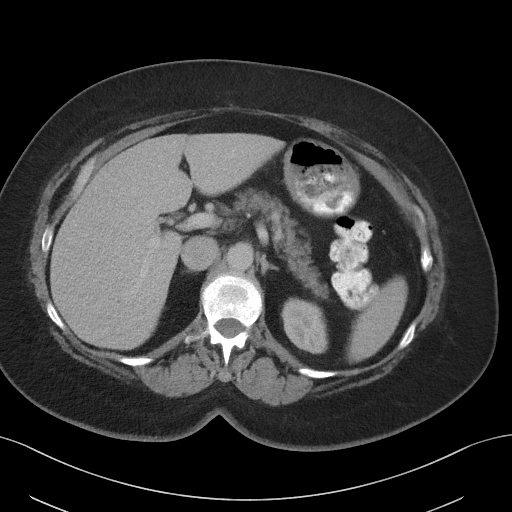
[im 55/86  bone]
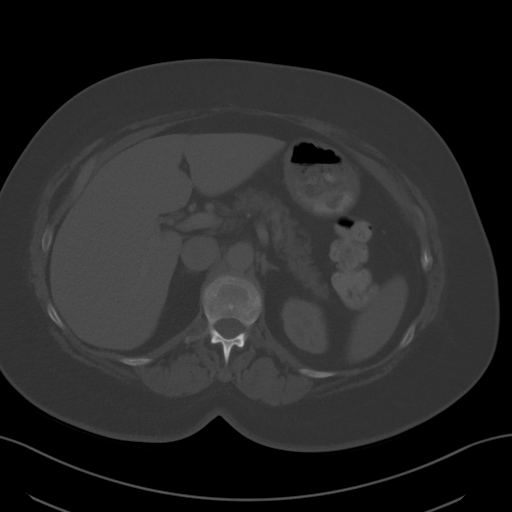
[im 61/86  soft-tissue]
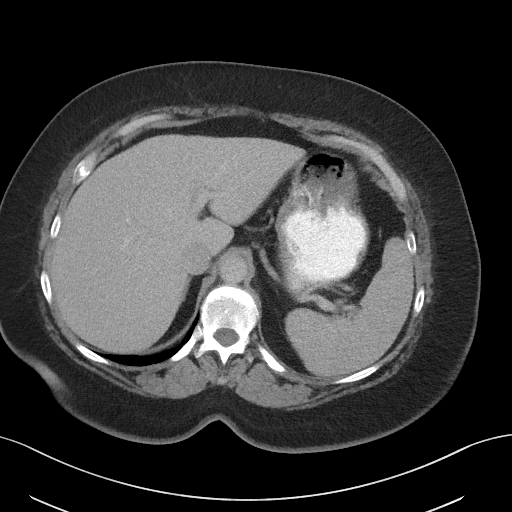
[im 67/86  soft-tissue]
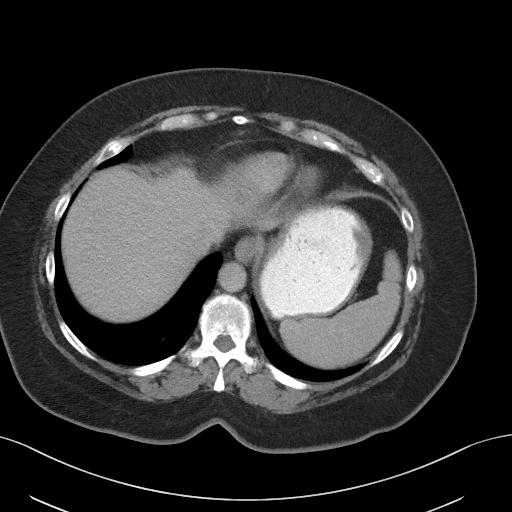
[im 73/86  soft-tissue]
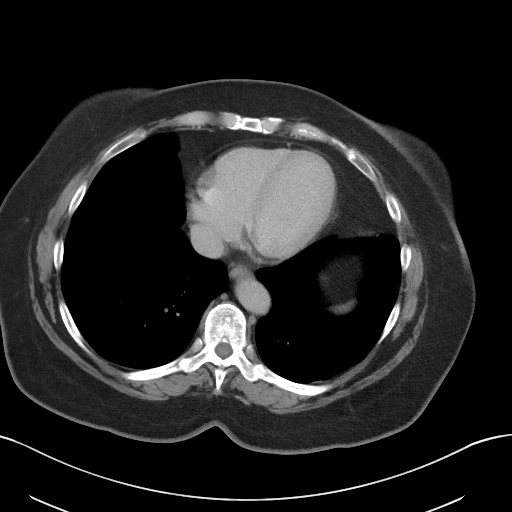
[im 79/86  soft-tissue]
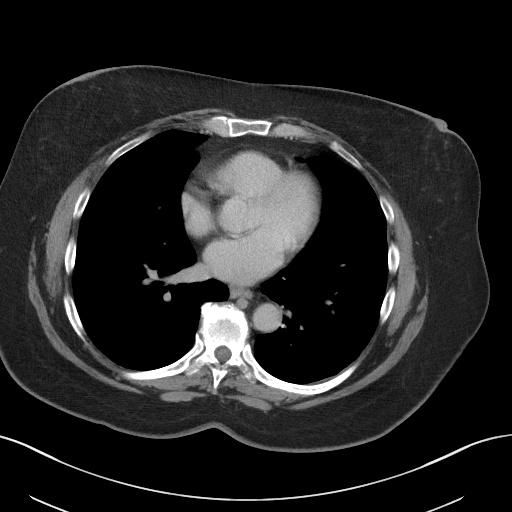

[Series 4: coronal st · coronal · 0.79mm/px · 3 of 89 slices shown]
[im 30/89  soft-tissue]
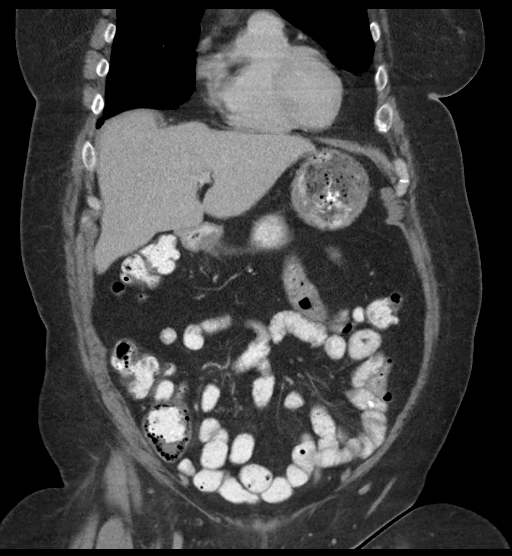
[im 40/89  soft-tissue]
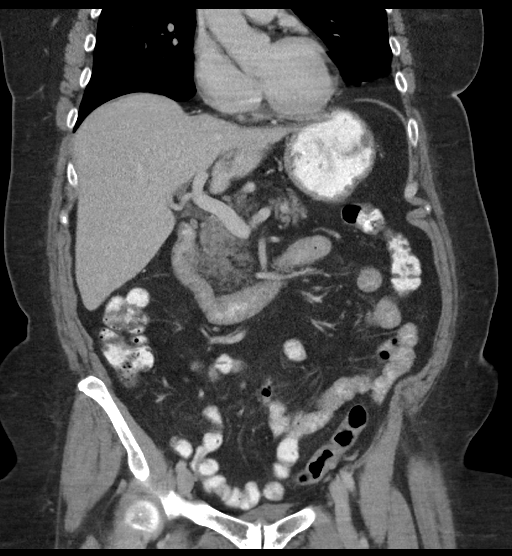
[im 49/89  soft-tissue]
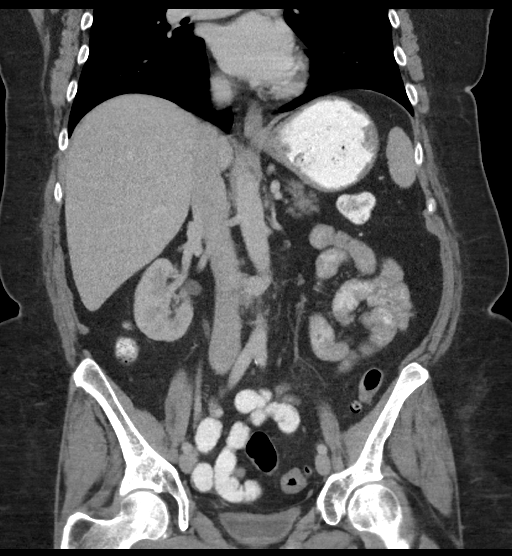

[16 of 46 positions shown; findings below may reference images not displayed]

FINDINGS: Lower chest: The lung bases are clear of acute process. No pleural
effusion or pulmonary lesions. The heart is normal in size. No
pericardial effusion. The distal esophagus and aorta are
unremarkable.

Hepatobiliary: No focal hepatic lesions or intrahepatic biliary
dilatation. The gallbladder is surgically absent. No common bile
duct dilatation.

Pancreas: No mass, inflammation or ductal dilatation. Prominent
fatty interstices.

Spleen: Normal size.  No focal lesions.

Adrenals/Urinary Tract: The adrenal glands and kidneys are
unremarkable. No renal, ureteral or bladder calculi or mass. The
delayed images do not demonstrate any collecting system
abnormalities.

Stomach/Bowel: The stomach, duodenum, small bowel and colon are
unremarkable. No acute inflammatory changes, mass lesions or
obstructive findings. The terminal ileum is normal. The appendix is
not identified. Colonic diverticulosis without findings for acute
diverticulitis.

Vascular/Lymphatic: Minimal scattered distal aortic and proximal
iliac artery calcifications. No aneurysm. The branch vessels are
patent. The major venous structures are patent.

There are small scattered mesenteric and retroperitoneal lymph nodes
along with small scattered probable peritoneal implants. Irregular
nodal lesion or implant in the central small bowel mesentery image
number 62 measures 18 x 18 mm. No pelvic or inguinal adenopathy.

Reproductive: The endometrium is markedly thickened and very
heterogeneous. The endometrium is thickened up to 5 cm. No obvious
serosal involvement or direct extrauterine spread. There are small
uterine fibroids noted. The cervix is grossly normal. The ovaries
are normal.

Other: No free pelvic fluid collections. No abdominal wall hernia or
subcutaneous lesions.

Musculoskeletal: No significant bony findings.
IMPRESSION: 1. Grossly thickened and heterogeneous endometrium consistent with
known endometrial carcinoma. No CT findings to suggest serosal or
extra uterine direct extension. No pelvic adenopathy.
2. There are worrisome small peritoneal nodules and a small
mesenteric nodal mass in the root of the small bowel mesentery
suspicious for metastatic disease.
3. No metastatic pulmonary nodules at the lung bases or evidence of
solid abdominal organ metastatic disease.

## 2018-12-23 IMAGING — XA IR US GUIDE VASC ACCESS RIGHT
1 series · 1 of 1 positions shown · non-contrast
Comparison: None.

INDICATION: History of endometrial cancer. In need of durable intravenous access
for chemotherapy administration.

EXAM:
IMPLANTED PORT A CATH PLACEMENT WITH ULTRASOUND AND FLUOROSCOPIC
GUIDANCE

[Series 300: line placements · 1 of 1 slices shown]
[im 1/1]
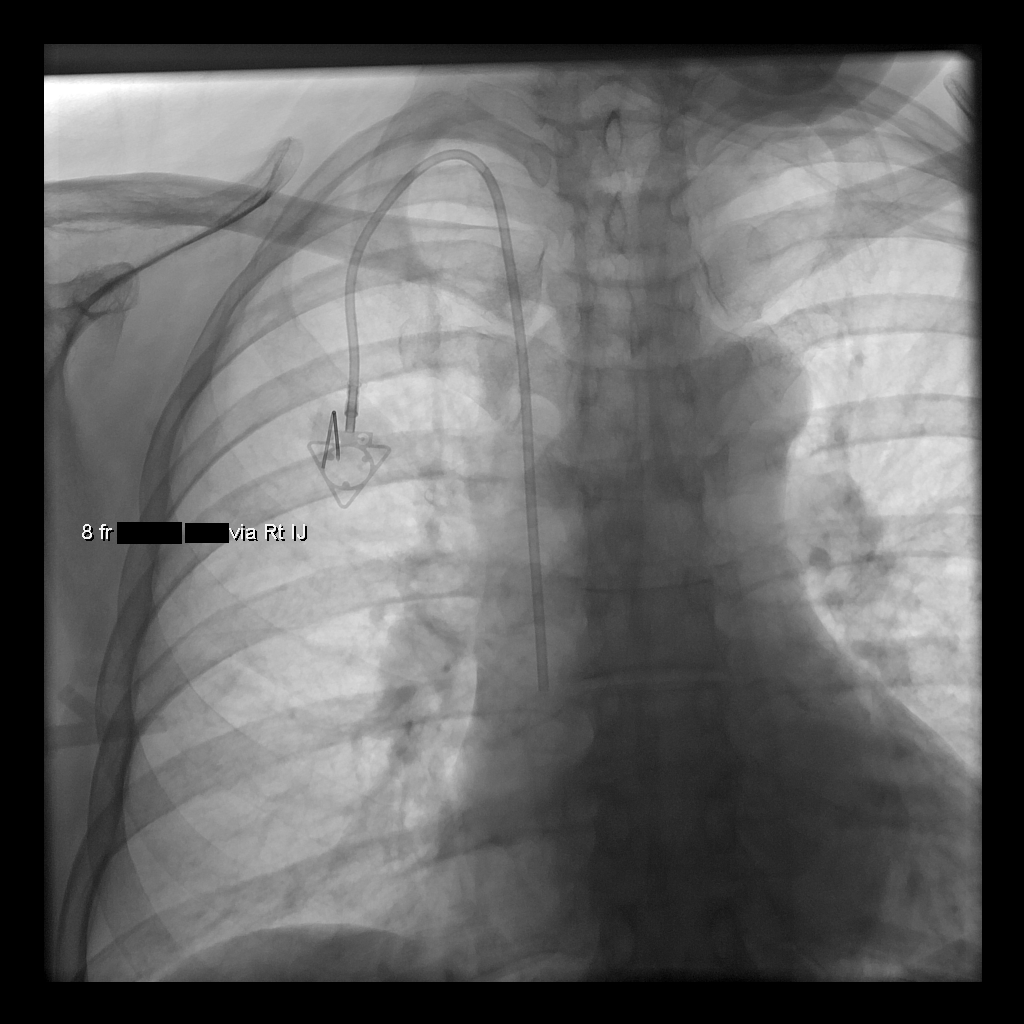

[1 of 1 positions shown; findings below may reference images not displayed]

MEDICATIONS:
Ancef 2 gm IV; The antibiotic was administered within an appropriate
time interval prior to skin puncture.

ANESTHESIA/SEDATION:
Moderate (conscious) sedation was employed during this procedure. A
total of Versed 2.5 mg and Fentanyl 150 mcg was administered
intravenously.

Moderate Sedation Time: 26 minutes. The patient's level of
consciousness and vital signs were monitored continuously by
radiology nursing throughout the procedure under my direct
supervision.

CONTRAST:  None

FLUOROSCOPY TIME:  54 seconds (11 mGy)

COMPLICATIONS:
None immediate.

PROCEDURE:
The procedure, risks, benefits, and alternatives were explained to
the patient. Questions regarding the procedure were encouraged and
answered. The patient understands and consents to the procedure.

The right neck and chest were prepped with chlorhexidine in a
sterile fashion, and a sterile drape was applied covering the
operative field. Maximum barrier sterile technique with sterile
gowns and gloves were used for the procedure. A timeout was
performed prior to the initiation of the procedure. Local anesthesia
was provided with 1% lidocaine with epinephrine.

After creating a small venotomy incision, a micropuncture kit was
utilized to access the internal jugular vein. Real-time ultrasound
guidance was utilized for vascular access including the acquisition
of a permanent ultrasound image documenting patency of the accessed
vessel. The microwire was utilized to measure appropriate catheter
length.

A subcutaneous port pocket was then created along the upper chest
wall utilizing a combination of sharp and blunt dissection. The
pocket was irrigated with sterile saline. A single lumen ISP power
injectable port was chosen for placement. The 8 Fr catheter was
tunneled from the port pocket site to the venotomy incision. The
port was placed in the pocket. The external catheter was trimmed to
appropriate length. At the venotomy, an 8 Fr peel-away sheath was
placed over a guidewire under fluoroscopic guidance. The catheter
was then placed through the sheath and the sheath was removed. Final
catheter positioning was confirmed and documented with a
fluoroscopic spot radiograph. The port was accessed with Rav Tabb
needle, aspirated and flushed with heparinized saline.

The venotomy site was closed with an interrupted 4-0 Vicryl suture.
The port pocket incision was closed with interrupted 2-0 Vicryl
suture and the skin was opposed with a running subcuticular 4-0
Vicryl suture. Dermabond and Mau were applied to both
incisions. Dressings were placed. The patient tolerated the
procedure well without immediate post procedural complication.
FINDINGS: After catheter placement, the tip lies within the superior
cavoatrial junction. The catheter aspirates and flushes normally and
is ready for immediate use.
IMPRESSION: Successful placement of a right internal jugular approach power
injectable Port-A-Cath. The catheter is ready for immediate use.

## 2019-01-10 ENCOUNTER — Other Ambulatory Visit: Payer: Self-pay

## 2019-01-10 ENCOUNTER — Inpatient Hospital Stay: Payer: PRIVATE HEALTH INSURANCE | Attending: Gynecologic Oncology

## 2019-01-29 ENCOUNTER — Ambulatory Visit: Payer: PRIVATE HEALTH INSURANCE | Admitting: Radiation Oncology

## 2019-03-15 ENCOUNTER — Ambulatory Visit: Payer: PRIVATE HEALTH INSURANCE | Admitting: Radiation Oncology

## 2019-03-15 ENCOUNTER — Inpatient Hospital Stay: Payer: PRIVATE HEALTH INSURANCE | Attending: Gynecologic Oncology

## 2019-03-15 ENCOUNTER — Other Ambulatory Visit: Payer: Self-pay

## 2019-03-15 DIAGNOSIS — Z452 Encounter for adjustment and management of vascular access device: Secondary | ICD-10-CM | POA: Diagnosis present

## 2019-03-15 DIAGNOSIS — Z95828 Presence of other vascular implants and grafts: Secondary | ICD-10-CM

## 2019-03-15 DIAGNOSIS — Z8542 Personal history of malignant neoplasm of other parts of uterus: Secondary | ICD-10-CM | POA: Insufficient documentation

## 2019-03-15 DIAGNOSIS — C7A8 Other malignant neuroendocrine tumors: Secondary | ICD-10-CM

## 2019-03-15 DIAGNOSIS — Z923 Personal history of irradiation: Secondary | ICD-10-CM | POA: Insufficient documentation

## 2019-03-15 DIAGNOSIS — Z9221 Personal history of antineoplastic chemotherapy: Secondary | ICD-10-CM | POA: Insufficient documentation

## 2019-03-15 DIAGNOSIS — Z8589 Personal history of malignant neoplasm of other organs and systems: Secondary | ICD-10-CM | POA: Diagnosis present

## 2019-03-15 DIAGNOSIS — E041 Nontoxic single thyroid nodule: Secondary | ICD-10-CM | POA: Diagnosis not present

## 2019-03-15 DIAGNOSIS — D61818 Other pancytopenia: Secondary | ICD-10-CM | POA: Insufficient documentation

## 2019-03-15 DIAGNOSIS — Z79899 Other long term (current) drug therapy: Secondary | ICD-10-CM | POA: Diagnosis not present

## 2019-03-15 MED ORDER — HEPARIN SOD (PORK) LOCK FLUSH 100 UNIT/ML IV SOLN
500.0000 [IU] | Freq: Once | INTRAVENOUS | Status: AC
  Administered 2019-03-15: 500 [IU]
  Filled 2019-03-15: qty 5

## 2019-03-15 MED ORDER — SODIUM CHLORIDE 0.9% FLUSH
10.0000 mL | Freq: Once | INTRAVENOUS | Status: AC
  Administered 2019-03-15: 10 mL
  Filled 2019-03-15: qty 10

## 2019-03-22 ENCOUNTER — Ambulatory Visit
Admission: RE | Admit: 2019-03-22 | Discharge: 2019-03-22 | Disposition: A | Discharge status: Home / Self Care | Payer: PRIVATE HEALTH INSURANCE | Source: Ambulatory Visit | Attending: Radiation Oncology | Admitting: Radiation Oncology

## 2019-03-22 ENCOUNTER — Other Ambulatory Visit: Payer: Self-pay

## 2019-03-22 ENCOUNTER — Telehealth: Payer: Self-pay | Admitting: Hematology and Oncology

## 2019-03-22 ENCOUNTER — Encounter: Payer: Self-pay | Admitting: Radiation Oncology

## 2019-03-22 ENCOUNTER — Telehealth: Payer: Self-pay | Admitting: Oncology

## 2019-03-22 VITALS — BP 130/59 | HR 65 | Temp 98.5°F | Resp 18 | Ht 64.0 in | Wt 226.0 lb

## 2019-03-22 DIAGNOSIS — Z79899 Other long term (current) drug therapy: Secondary | ICD-10-CM | POA: Diagnosis not present

## 2019-03-22 DIAGNOSIS — Z8542 Personal history of malignant neoplasm of other parts of uterus: Secondary | ICD-10-CM | POA: Insufficient documentation

## 2019-03-22 DIAGNOSIS — Z923 Personal history of irradiation: Secondary | ICD-10-CM | POA: Insufficient documentation

## 2019-03-22 DIAGNOSIS — C541 Malignant neoplasm of endometrium: Secondary | ICD-10-CM

## 2019-03-22 NOTE — Progress Notes (Signed)
Radiation Oncology         [phone] ________________________________  Name: Connie Simmons MRN: [id number]  Date: 03/22/2019  DOB: 08/23/1946  Follow-Up Visit Note  CC: Connie Pillar, MD  Connie Lark, MD    ICD-10-CM   1. Endometrial cancer (HCC) C54.1     Diagnosis:   73 y.o. 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 Iridium 192 wasthe high dose rate source.   Narrative:  The patient returns today for routine follow-up.  She was last seen by Dr. Denman George 5 months ago with no evidence of disease on clinical exam or CT imaging.  On review of systems, she denies any GI or GU issues. She is no longer using her vaginal dilator.  No reports of vaginal bleeding.               ALLERGIES:  is allergic to fish oil; simvastatin; codeine; and sulfa antibiotics.  Meds: Current Outpatient Medications  Medication Sig Dispense Refill  . acetaminophen (TYLENOL) 500 MG tablet Take 1,000 mg by mouth 2 (two) times daily as needed for moderate pain or headache.    Marland Kitchen alum & mag hydroxide-simeth (MAALOX/MYLANTA) 200-200-20 MG/5ML suspension Take by mouth every 6 (six) hours as needed for indigestion or heartburn.    . benazepril-hydrochlorthiazide (LOTENSIN HCT) 20-12.5 MG tablet Take 1 tablet by mouth every morning.  1  . calcium carbonate (TUMS - DOSED IN MG ELEMENTAL CALCIUM) 500 MG chewable tablet Chew 2 tablets by mouth daily.    . hydrocortisone (ANUSOL-HC) 25 MG suppository Place 1 suppository (25 mg total) rectally 2 (two) times daily. (Patient taking differently: Place 25 mg rectally as needed. ) 12 suppository 0  . ibuprofen (ADVIL,MOTRIN) 200 MG tablet Take 400-600 mg by mouth 2 (two) times daily  as needed for moderate pain.     Marland Kitchen lidocaine-prilocaine (EMLA) cream Apply to affected area once 30 g 3  . magnesium oxide (MAG-OX) 400 MG tablet Take 250 mg by mouth daily. Started taking for leg cramps    . Multiple Vitamin (MULTIVITAMIN WITH MINERALS) TABS tablet Take 1 tablet by mouth daily.    . fluticasone (FLONASE) 50 MCG/ACT nasal spray Place 1 spray into both nostrils daily as needed for allergies or rhinitis.    Marland Kitchen guaiFENesin (MUCINEX) 600 MG 12 hr tablet Take 600 mg by mouth 2 (two) times daily.    . ondansetron (ZOFRAN) 8 MG tablet Take 1 tablet (8 mg total) by mouth every 8 (eight) hours as needed for refractory nausea / vomiting. Start on day 3 after chemo. (Patient not taking: Reported on 10/25/2018) 30 tablet 1  . prochlorperazine (COMPAZINE) 10 MG tablet Take 1 tablet (10 mg total) by mouth every 6 (six) hours as needed (Nausea or vomiting). (Patient not taking: Reported on 10/25/2018) 30 tablet 1  . traMADol (ULTRAM) 50 MG tablet Take 1 tablet (50 mg total) by mouth every 6 (six) hours as needed. (Patient not taking: Reported on 10/25/2018) 60 tablet 0   No current facility-administered medications for this encounter.     Physical Findings: The patient is in no acute distress. Patient is alert and oriented.  height is 5\' 4"  (1.626 m) and weight is 226 lb (102.5 kg). Her temporal temperature is 98.5 F (  36.9 C). Her blood pressure is 130/59 (abnormal) and her pulse is 65. Her respiration is 18 and oxygen saturation is 100%.   Lungs are clear to auscultation bilaterally. Heart has regular rate and rhythm. No palpable cervical, supraclavicular, or axillary adenopathy. Abdomen soft, non-tender, normal bowel sounds.  On pelvic examination the external genitalia were unremarkable. A speculum exam was performed. There are no mucosal lesions noted in the vaginal vault. On bimanual examination there were no pelvic masses appreciated.  Lab Findings: Lab Results  Component Value Date   WBC  6.6 09/18/2018   HGB 10.5 (L) 09/18/2018   HCT 31.6 (L) 09/18/2018   MCV 86.6 09/18/2018   PLT 230 09/18/2018    Radiographic Findings: No results found.  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 No evidence of recurrence on exam. Clinically she seems to be doing very well at this time. The patient stopped using her vaginal dilator, and I recommended she resume this for at least 1 more year.   Plan:  Patient will follow up in radiation oncology in 6 months and will see Dr. Denman George in 3 months.  ____________________________________  Connie Promise, PhD, MD  This document serves as a record of services personally performed by Gery Pray, MD. It was created on his behalf by Rae Lips, a trained medical scribe. The creation of this record is based on the scribe's personal observations and the provider's statements to them. This document has been checked and approved by the attending provider.

## 2019-03-22 NOTE — Telephone Encounter (Signed)
Scheduled appts per sch msg. Called, no answer. Mailed printout.

## 2019-03-22 NOTE — Telephone Encounter (Signed)
Called [place] and asked if she wants to keep her port or have it removed.  She said she would like to talk about it with Dr. Denman George at her next appointment in September and then decide.  She also asked if she needs to make an appointment to follow up with Dr. Alvy Bimler in December.

## 2019-03-22 NOTE — Telephone Encounter (Signed)
Called Connie Simmons back with message from Dr. Alvy Bimler.  She said she would like to follow up with Dr. Watt Climes since she works at [employer]

## 2019-03-22 NOTE — Telephone Encounter (Signed)
Her neuroendocrine tumor can be followed by her GI doctor If not, I can certainly see her for that Let me know what she decides I will send more flush appt for her

## 2019-03-22 NOTE — Patient Instructions (Signed)
Coronavirus (COVID-19) Are you at risk?  Are you at risk for the Coronavirus (COVID-19)?  To be considered HIGH RISK for Coronavirus (COVID-19), you have to meet the following criteria:  . Traveled to [place], [place], [place], [place] or [place]; or in the [place] to [place], [place], [place], or [place]; and have fever, cough, and shortness of breath within the last 2 weeks of travel OR . Been in close contact with a person diagnosed with COVID-19 within the last 2 weeks and have fever, cough, and shortness of breath . IF YOU DO NOT MEET THESE CRITERIA, YOU ARE CONSIDERED LOW RISK FOR COVID-19.  What to do if you are HIGH RISK for COVID-19?  . If you are having a medical emergency, call 911. . Seek medical care right away. Before you go to a doctor's office, urgent care or emergency department, call ahead and tell them about your recent travel, contact with someone diagnosed with COVID-19, and your symptoms. You should receive instructions from your physician's office regarding next steps of care.  . When you arrive at healthcare provider, tell the healthcare staff immediately you have returned from visiting [place], [place], [place], [place] or [place]; or traveled in the [place] to [place], [place], [place], or [place]; in the last two weeks or you have been in close contact with a person diagnosed with COVID-19 in the last 2 weeks.   . Tell the health care staff about your symptoms: fever, cough and shortness of breath. . After you have been seen by a medical provider, you will be either: o Tested for (COVID-19) and discharged home on quarantine except to seek medical care if symptoms worsen, and asked to  - Stay home and avoid contact with others until you get your results (4-5 days)  - Avoid travel on public transportation if possible (such as bus, train, or airplane) or o Sent to the Emergency Department by EMS for evaluation, COVID-19 testing, and possible  admission depending on your condition and test results.  What to do if you are LOW RISK for COVID-19?  Reduce your risk of any infection by using the same precautions used for avoiding the common cold or flu:  . Wash your hands often with soap and warm water for at least 20 seconds.  If soap and water are not readily available, use an alcohol-based hand sanitizer with at least 60% alcohol.  . If coughing or sneezing, cover your mouth and nose by coughing or sneezing into the elbow areas of your shirt or coat, into a tissue or into your sleeve (not your hands). . Avoid shaking hands with others and consider head nods or verbal greetings only. . Avoid touching your eyes, nose, or mouth with unwashed hands.  . Avoid close contact with people who are sick. . Avoid places or events with large numbers of people in one location, like concerts or sporting events. . Carefully consider travel plans you have or are making. . If you are planning any travel outside or inside the [place], visit the CDC's Travelers' Health webpage for the latest health notices. . If you have some symptoms but not all symptoms, continue to monitor at home and seek medical attention if your symptoms worsen. . If you are having a medical emergency, call 911.   ADDITIONAL HEALTHCARE OPTIONS FOR PATIENTS   Telehealth / e-Visit: [email or web address]         [facility]: [phone]  [facility]: [phone]                   [facility]: [phone]   

## 2019-03-22 NOTE — Progress Notes (Signed)
Patient in for follow up she is post 13 months from radiation. Her last pelvic was in January. She does not have a follow up with Denman George at this time. Denies GI, GU issues is not using dilators any longer.  BP (!) 130/59 (BP Location: Left Arm, Patient Position: Sitting)   Pulse 65   Temp 98.5 F (36.9 C) (Temporal)   Resp 18   Ht 5\' 4"  (1.626 m)   Wt 226 lb (102.5 kg)   SpO2 100%   BMI 38.79 kg/m  Wt Readings from Last 3 Encounters:  03/22/19 226 lb (102.5 kg)  10/25/18 219 lb (99.3 kg)  08/03/18 214 lb 6.4 oz (97.3 kg)

## 2019-05-02 ENCOUNTER — Inpatient Hospital Stay: Payer: PRIVATE HEALTH INSURANCE | Attending: Gynecologic Oncology

## 2019-05-02 ENCOUNTER — Other Ambulatory Visit: Payer: Self-pay

## 2019-05-02 DIAGNOSIS — Z79899 Other long term (current) drug therapy: Secondary | ICD-10-CM | POA: Insufficient documentation

## 2019-05-02 DIAGNOSIS — E041 Nontoxic single thyroid nodule: Secondary | ICD-10-CM | POA: Diagnosis not present

## 2019-05-02 DIAGNOSIS — Z923 Personal history of irradiation: Secondary | ICD-10-CM | POA: Diagnosis not present

## 2019-05-02 DIAGNOSIS — Z95828 Presence of other vascular implants and grafts: Secondary | ICD-10-CM

## 2019-05-02 DIAGNOSIS — D61818 Other pancytopenia: Secondary | ICD-10-CM | POA: Diagnosis not present

## 2019-05-02 DIAGNOSIS — Z8542 Personal history of malignant neoplasm of other parts of uterus: Secondary | ICD-10-CM | POA: Insufficient documentation

## 2019-05-02 DIAGNOSIS — Z8589 Personal history of malignant neoplasm of other organs and systems: Secondary | ICD-10-CM | POA: Diagnosis not present

## 2019-05-02 DIAGNOSIS — C7A8 Other malignant neuroendocrine tumors: Secondary | ICD-10-CM

## 2019-05-02 DIAGNOSIS — Z9221 Personal history of antineoplastic chemotherapy: Secondary | ICD-10-CM | POA: Insufficient documentation

## 2019-05-02 DIAGNOSIS — Z452 Encounter for adjustment and management of vascular access device: Secondary | ICD-10-CM | POA: Insufficient documentation

## 2019-05-02 MED ORDER — HEPARIN SOD (PORK) LOCK FLUSH 100 UNIT/ML IV SOLN
500.0000 [IU] | Freq: Once | INTRAVENOUS | Status: AC
  Administered 2019-05-02: 500 [IU]
  Filled 2019-05-02: qty 5

## 2019-05-02 MED ORDER — SODIUM CHLORIDE 0.9% FLUSH
10.0000 mL | Freq: Once | INTRAVENOUS | Status: AC
  Administered 2019-05-02: 10 mL
  Filled 2019-05-02: qty 10

## 2019-06-13 ENCOUNTER — Other Ambulatory Visit: Payer: PRIVATE HEALTH INSURANCE

## 2019-06-20 ENCOUNTER — Other Ambulatory Visit: Payer: Self-pay | Admitting: Oncology

## 2019-06-20 ENCOUNTER — Encounter: Payer: Self-pay | Admitting: Gynecologic Oncology

## 2019-06-20 ENCOUNTER — Other Ambulatory Visit: Payer: Self-pay

## 2019-06-20 ENCOUNTER — Inpatient Hospital Stay: Payer: PRIVATE HEALTH INSURANCE | Attending: Gynecologic Oncology | Admitting: Gynecologic Oncology

## 2019-06-20 VITALS — BP 125/80 | HR 64 | Temp 98.5°F | Resp 18 | Ht 64.0 in | Wt 230.4 lb

## 2019-06-20 DIAGNOSIS — Z923 Personal history of irradiation: Secondary | ICD-10-CM | POA: Insufficient documentation

## 2019-06-20 DIAGNOSIS — Z90710 Acquired absence of both cervix and uterus: Secondary | ICD-10-CM | POA: Diagnosis not present

## 2019-06-20 DIAGNOSIS — C7A8 Other malignant neuroendocrine tumors: Secondary | ICD-10-CM | POA: Diagnosis not present

## 2019-06-20 DIAGNOSIS — Z90722 Acquired absence of ovaries, bilateral: Secondary | ICD-10-CM | POA: Diagnosis not present

## 2019-06-20 DIAGNOSIS — I10 Essential (primary) hypertension: Secondary | ICD-10-CM | POA: Insufficient documentation

## 2019-06-20 DIAGNOSIS — C7B8 Other secondary neuroendocrine tumors: Secondary | ICD-10-CM | POA: Diagnosis present

## 2019-06-20 DIAGNOSIS — E669 Obesity, unspecified: Secondary | ICD-10-CM | POA: Insufficient documentation

## 2019-06-20 DIAGNOSIS — Z79899 Other long term (current) drug therapy: Secondary | ICD-10-CM | POA: Insufficient documentation

## 2019-06-20 DIAGNOSIS — E7800 Pure hypercholesterolemia, unspecified: Secondary | ICD-10-CM | POA: Diagnosis not present

## 2019-06-20 DIAGNOSIS — Z9221 Personal history of antineoplastic chemotherapy: Secondary | ICD-10-CM | POA: Insufficient documentation

## 2019-06-20 DIAGNOSIS — Z6839 Body mass index (BMI) 39.0-39.9, adult: Secondary | ICD-10-CM | POA: Diagnosis not present

## 2019-06-20 DIAGNOSIS — C541 Malignant neoplasm of endometrium: Secondary | ICD-10-CM | POA: Insufficient documentation

## 2019-06-20 NOTE — Progress Notes (Signed)
Met with Connie Simmons after her appointment with Dr. Denman George.  Advised her that IR will be calling her with an appointment to have her port removed.

## 2019-06-20 NOTE — Patient Instructions (Signed)
Dr Denman George will schedule you to have your port removed. Connie Simmons will contact you with further information about this.  Dr Denman George has scheduled for a CT scan in January to reassess the neuroendocrine tumor.  Please notify Dr Denman George at phone number [phone] if you notice vaginal bleeding, new pelvic or abdominal pains, bloating, feeling full easy, or a change in bladder or bowel function.   Please return to see Dr Sondra Come in December. Please ask his office to contact Dr Serita Grit office (at [phone]) after that visit to request an appointment with her for March, 2021.

## 2019-06-20 NOTE — Progress Notes (Signed)
Follow-up Note: Gyn-Onc  Consult was initially requested by Dr. Nelda Marseille for the evaluation of Connie Simmons 73 y.o. female  CC:  Chief Complaint  Patient presents with  . endometrial cancer  . Ovarian Cancer    Assessment/Plan:  Connie Simmons  is a 73 y.o.  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 S/p staging surgery on 12/01/17. MSI high endometrial tumor. Adjuvant carboplatin and paclitaxel x 6 and vaginal brachytherapy completed on 05/08/18.  Complete clinical response on imaging.  Annual CT imaging to follow-up neuroendocrine tumor -recommend CT in January, 2021 unless new symptom develop.  She will see Dr Sondra Come in 3 months and me back in 6 months.  Will remove port given that she remains disease free.    HPI: Connie Simmons is a 73 year old P2 who is seen in consultation at the request of Dr Nelda Marseille for serous endometrial cancer in the setting of obesity (BMI 39kg/m2).  The patient experienced vaginal bleeding for approximately 6 months though initially it was only light spotting. In January, 2019 it became heavier. She was seen at [facility] on 11/04/17 and prescribed Megace. She was then seen by Dr Nelda Marseille and an endometrial biopsy was performed on 11/10/17 which showed high grade serous endometrial cancer. TVUS showed a uterus measuring 6.3x11.2x7cm. The endometrium was 4cm thick.   The patient has a family history of a grandfather with colon cancer. She has had 2 prior SVD's and an open cholecystectomy.   On 11/23/17 preoperative CT abdo/pelvis showed signs of metastatic peritoneal disease with grossly thickened and heterogeneous endometrium consistent with known endometrial carcinoma. No CT findings to suggest serosal or extra uterine direct extension. No pelvic adenopathy. There are worrisome small peritoneal nodules and a small mesenteric nodal mass in the root of the  small bowel mesentery suspicious for metastatic disease. No metastatic pulmonary nodules at the lung bases or evidence of solid abdominal organ metastatic disease. On 12/01/17 she underwent robot assisted total hysterectomy, BSO, SLN biopsy and debulking of larger peritoneal implants. Final pathology revealed a 3.5cm high grade serous endometrial cancer with outer half myometrial invasion (1.2 of 2cm), negative LVSI and negative lymph nodes. The tumor was MSI high. There was an incidentally found neuroendocrine tumor on the left ovary with the following description:  The left ovary shows a 1 cm nodule of neoplasm characterized by sheets of cells with uniform, round to oval nuclei with rare mitotic figures. Iimmunohistochemistry is performed and the left ovarian tumor is positive with cytokeratin AE1/AE3, CD56, chromogranin, synaptophysin and CDX-2. The tumor is negative with inhibin, WT-1, calretinin, CD99, cytokerain 20, cytokeratin 7, estrogen receptor, progesterone receptor, vimentin, and p53. The morphology and immunophenotype are consistent with a low to intermediate grade neuroendocrine tumor and CDX-2 positivity suggests possible gastrointestinal origin. There is a similar 0.6 cm nodule in the paraovarian connective tissue and the peritoneal nodule (part 2) has identical features. The peritoneal nodules that were resected were also consistent with neuroendocrine tumor (not endometrial serous cancer).  Her case was reviewed at multidisciplinary tumor board and she was determined to have a primary ovarian neuroendocrine tumor and separate stage IB high grade serous endometrial cancer. She was recommended to undergo repeat staging imaging, GI evaluation, and then adjuvant therapy for the endometrial cancer.   Follow-up post-operative imaging of the chest abdomen and pelvis on 12/21/17 showed a 2.4cm left thyroid lobe nodule. A few  tiny scattered bilateral upper lobe pulmonary nodules are seen measuring less  than 12m, which are indeterminate. There was no residual or metastatic carcinoma seen within the abdomen or pelvis.  Given the high risk uterine pathology she was recommended to receive 6 cycles of adjuvant carb/taxol chemotherapy and vaginal brachytherapy.  She completed 6 cycles of adjuvant carboplatin and paclitaxel chemotherapy on 05/08/18. Vaginal brachytherapy (30Gy in 5 fractions) was completed on 03/08/18.  Post treatment imaging with CT abd/pelvis on 10/23/17 showed no evidence of persistent/recurrent disease. She had diverticulosis seen.  Interval Hx: She has been doing well with no symptoms of recurrence.  Current Meds:   Outpatient Encounter Medications as of 06/20/2019  Medication Sig  . Melatonin 5 MG TABS Take by mouth at bedtime as needed.  .Marland Kitchenacetaminophen (TYLENOL) 500 MG tablet Take 1,000 mg by mouth 2 (two) times daily as needed for moderate pain or headache.  .Marland Kitchenalum & mag hydroxide-simeth (MAALOX/MYLANTA) 200-200-20 MG/5ML suspension Take by mouth every 6 (six) hours as needed for indigestion or heartburn.  . benazepril-hydrochlorthiazide (LOTENSIN HCT) 20-12.5 MG tablet Take 1 tablet by mouth every morning.  . calcium carbonate (TUMS - DOSED IN MG ELEMENTAL CALCIUM) 500 MG chewable tablet Chew 2 tablets by mouth daily.  . fluticasone (FLONASE) 50 MCG/ACT nasal spray Place 1 spray into both nostrils daily as needed for allergies or rhinitis.  .Marland KitchenguaiFENesin (MUCINEX) 600 MG 12 hr tablet Take 600 mg by mouth 2 (two) times daily.  . hydrocortisone (ANUSOL-HC) 25 MG suppository Place 1 suppository (25 mg total) rectally 2 (two) times daily. (Patient taking differently: Place 25 mg rectally as needed. )  . ibuprofen (ADVIL,MOTRIN) 200 MG tablet Take 400-600 mg by mouth 2 (two) times daily as needed for moderate pain.   .Marland Kitchenlidocaine-prilocaine (EMLA) cream Apply to affected area once  . magnesium oxide (MAG-OX) 400 MG tablet Take 250 mg by mouth daily. Started taking for leg cramps   . Multiple Vitamin (MULTIVITAMIN WITH MINERALS) TABS tablet Take 1 tablet by mouth daily.  . ondansetron (ZOFRAN) 8 MG tablet Take 1 tablet (8 mg total) by mouth every 8 (eight) hours as needed for refractory nausea / vomiting. Start on day 3 after chemo. (Patient not taking: Reported on 10/25/2018)  . prochlorperazine (COMPAZINE) 10 MG tablet Take 1 tablet (10 mg total) by mouth every 6 (six) hours as needed (Nausea or vomiting). (Patient not taking: Reported on 10/25/2018)  . traMADol (ULTRAM) 50 MG tablet Take 1 tablet (50 mg total) by mouth every 6 (six) hours as needed. (Patient not taking: Reported on 10/25/2018)   No facility-administered encounter medications on file as of 06/20/2019.     Allergy:  Allergies  Allergen Reactions  . Fish Oil Nausea Only    Abdominal pain  . Simvastatin Other (See Comments)    Abdominal pain  . Codeine Itching  . Sulfa Antibiotics Itching and Rash    Social Hx:   Social History   Socioeconomic History  . Marital status: Divorced    Spouse name: Not on file  . Number of children: Not on file  . Years of education: Not on file  . Highest education level: Not on file  Occupational History  . Occupation: rHaematologist Social Needs  . Financial resource strain: Not on file  . Food insecurity    Worry: Not on file    Inability: Not on file  . Transportation needs    Medical: Not on file    Non-medical:  Not on file  Tobacco Use  . Smoking status: Never Smoker  . Smokeless tobacco: Never Used  Substance and Sexual Activity  . Alcohol use: No  . Drug use: No  . Sexual activity: Not on file  Lifestyle  . Physical activity    Days per week: Not on file    Minutes per session: Not on file  . Stress: Not on file  Relationships  . Social Herbalist on phone: Not on file    Gets together: Not on file    Attends religious service: Not on file    Active member of club or organization: Not on file    Attends meetings of clubs or  organizations: Not on file    Relationship status: Not on file  . Intimate partner violence    Fear of current or ex partner: Not on file    Emotionally abused: Not on file    Physically abused: Not on file    Forced sexual activity: Not on file  Other Topics Concern  . Not on file  Social History Narrative  . Not on file    Past Surgical Hx:  Past Surgical History:  Procedure Laterality Date  . CHOLECYSTECTOMY OPEN  1974  . DILATION AND CURETTAGE OF UTERUS  1980s  . IR CV LINE INJECTION  01/26/2018  . IR FLUORO GUIDE PORT INSERTION RIGHT  12/29/2017  . IR US GUIDE VASC ACCESS RIGHT  12/29/2017  . ROBOTIC ASSISTED TOTAL HYSTERECTOMY WITH BILATERAL SALPINGO OOPHERECTOMY Bilateral 12/01/2017   Procedure: XI ROBOTIC ASSISTED TOTAL HYSTERECTOMY WITH BILATERAL SALPINGO OOPHORECTOMY WITH SENTINAL LYMPH NODES;  Surgeon: Everitt Amber, MD;  Location: [facility];  Service: Gynecology;  Laterality: Bilateral;  . STRABISMUS SURGERY  age 89   unilateral    Past Medical Hx:  Past Medical History:  Diagnosis Date  . Anemia   . Dental bridge present    front upper tooth  . endometrial ca dx'd 10/2017  . Fibroid   . Hypercholesteremia   . Hypertension    followed by pcp  . Nocturia   . PONV (postoperative nausea and vomiting)     Past Gynecological History:  SVD x 2, fibroids No LMP recorded. Patient has had a hysterectomy.  Family Hx:  Family History  Problem Relation Age of Onset  . Colon cancer Other        paternal grandfather  . Cancer Father        prostate ca    Review of Systems:  Constitutional  Feels well,    ENT Normal appearing ears and nares bilaterally Skin/Breast  No rash, sores, jaundice, itching, dryness Cardiovascular  No chest pain, shortness of breath, or edema  Pulmonary  No cough or wheeze.  Gastro Intestinal  No nausea, vomitting, or diarrhoea. No bright red blood per rectum, no abdominal pain, change in bowel movement, or constipation.  Genito Urinary   No frequency, urgency, dysuria, no bleeding Musculo Skeletal  No myalgia, arthralgia, joint swelling or pain  Neurologic  No weakness, numbness, change in gait,  Psychology  No depression, anxiety, insomnia.   Vitals:  Blood pressure 125/80, pulse 64, temperature 98.5 F (36.9 C), temperature source Oral, resp. rate 18, height '5\' 4"'  (1.626 m), weight 230 lb 6.4 oz (104.5 kg), SpO2 100 %.  Physical Exam: WD in NAD Neck  Supple NROM, without any enlargements.  Lymph Node Survey No cervical supraclavicular or inguinal adenopathy Cardiovascular  Pulse normal rate, regularity and rhythm. S1  and S2 normal.  Lungs  Clear to auscultation bilateraly, without wheezes/crackles/rhonchi. Good air movement.  Skin  No rash/lesions/breakdown  Psychiatry  Alert and oriented to person, place, and time  Abdomen  Normoactive bowel sounds, abdomen soft, non-tender and obese without evidence of hernia. Incisions well healed. No masses. Back No CVA tenderness Genito Urinary  Vulva/vagina: Normal external female genitalia.  No lesions. No discharge or bleeding.  Bladder/urethra:  No lesions or masses, well supported bladder  Vagina: vaginal cuff smooth and regular with no masses Rectal  deferred Extremities  No bilateral cyanosis, clubbing or edema.   Thereasa Solo, MD  06/20/2019, 5:14 PM

## 2019-06-21 ENCOUNTER — Other Ambulatory Visit: Payer: Self-pay | Admitting: Hematology and Oncology

## 2019-07-12 ENCOUNTER — Other Ambulatory Visit: Payer: Self-pay | Admitting: Radiology

## 2019-07-13 ENCOUNTER — Encounter (HOSPITAL_COMMUNITY): Payer: Self-pay

## 2019-07-13 ENCOUNTER — Other Ambulatory Visit: Payer: Self-pay

## 2019-07-13 ENCOUNTER — Ambulatory Visit (HOSPITAL_COMMUNITY)
Admission: RE | Admit: 2019-07-13 | Discharge: 2019-07-13 | Disposition: A | Discharge status: Home / Self Care | Payer: No Typology Code available for payment source | Source: Ambulatory Visit | Attending: Gynecologic Oncology | Admitting: Gynecologic Oncology

## 2019-07-13 DIAGNOSIS — Z791 Long term (current) use of non-steroidal anti-inflammatories (NSAID): Secondary | ICD-10-CM | POA: Diagnosis not present

## 2019-07-13 DIAGNOSIS — Z885 Allergy status to narcotic agent status: Secondary | ICD-10-CM | POA: Insufficient documentation

## 2019-07-13 DIAGNOSIS — Z452 Encounter for adjustment and management of vascular access device: Secondary | ICD-10-CM | POA: Insufficient documentation

## 2019-07-13 DIAGNOSIS — Z79899 Other long term (current) drug therapy: Secondary | ICD-10-CM | POA: Diagnosis not present

## 2019-07-13 DIAGNOSIS — Z882 Allergy status to sulfonamides status: Secondary | ICD-10-CM | POA: Insufficient documentation

## 2019-07-13 DIAGNOSIS — Z90722 Acquired absence of ovaries, bilateral: Secondary | ICD-10-CM | POA: Diagnosis not present

## 2019-07-13 DIAGNOSIS — C7A8 Other malignant neuroendocrine tumors: Secondary | ICD-10-CM

## 2019-07-13 DIAGNOSIS — Z9221 Personal history of antineoplastic chemotherapy: Secondary | ICD-10-CM | POA: Diagnosis not present

## 2019-07-13 DIAGNOSIS — I10 Essential (primary) hypertension: Secondary | ICD-10-CM | POA: Diagnosis not present

## 2019-07-13 DIAGNOSIS — Z8542 Personal history of malignant neoplasm of other parts of uterus: Secondary | ICD-10-CM | POA: Diagnosis not present

## 2019-07-13 DIAGNOSIS — E7800 Pure hypercholesterolemia, unspecified: Secondary | ICD-10-CM | POA: Insufficient documentation

## 2019-07-13 HISTORY — PX: IR REMOVAL TUN ACCESS W/ PORT W/O FL MOD SED: IMG2290

## 2019-07-13 LAB — PROTIME-INR
INR: 1 (ref 0.8–1.2)
Prothrombin Time: 12.9 seconds (ref 11.4–15.2)

## 2019-07-13 LAB — CBC
HCT: 36.9 % (ref 36.0–46.0)
Hemoglobin: 11.3 g/dL — ABNORMAL LOW (ref 12.0–15.0)
MCH: 28.9 pg (ref 26.0–34.0)
MCHC: 30.6 g/dL (ref 30.0–36.0)
MCV: 94.4 fL (ref 80.0–100.0)
Platelets: 237 10*3/uL (ref 150–400)
RBC: 3.91 MIL/uL (ref 3.87–5.11)
RDW: 13.7 % (ref 11.5–15.5)
WBC: 7.9 10*3/uL (ref 4.0–10.5)
nRBC: 0 % (ref 0.0–0.2)

## 2019-07-13 LAB — BASIC METABOLIC PANEL
Anion gap: 13 (ref 5–15)
BUN: 27 mg/dL — ABNORMAL HIGH (ref 8–23)
CO2: 21 mmol/L — ABNORMAL LOW (ref 22–32)
Calcium: 9.3 mg/dL (ref 8.9–10.3)
Chloride: 105 mmol/L (ref 98–111)
Creatinine, Ser: 1.05 mg/dL — ABNORMAL HIGH (ref 0.44–1.00)
GFR calc Af Amer: >60 mL/min (ref >60)
GFR calc non Af Amer: 53 mL/min — ABNORMAL LOW (ref >60)
Glucose, Bld: 84 mg/dL (ref 70–99)
Potassium: 4.2 mmol/L (ref 3.5–5.1)
Sodium: 139 mmol/L (ref 135–145)

## 2019-07-13 MED ORDER — LIDOCAINE-EPINEPHRINE 1 %-1:100000 IJ SOLN
INTRAMUSCULAR | Status: AC
  Filled 2019-07-13: qty 1

## 2019-07-13 MED ORDER — SODIUM CHLORIDE 0.9 % IV SOLN
INTRAVENOUS | Status: DC
  Administered 2019-07-13: 13:00:00 via INTRAVENOUS

## 2019-07-13 MED ORDER — FENTANYL CITRATE (PF) 100 MCG/2ML IJ SOLN
INTRAMUSCULAR | Status: AC | PRN
  Administered 2019-07-13: 50 ug via INTRAVENOUS

## 2019-07-13 MED ORDER — LIDOCAINE HCL 1 % IJ SOLN
INTRAMUSCULAR | Status: AC
  Filled 2019-07-13: qty 20

## 2019-07-13 MED ORDER — MIDAZOLAM HCL 2 MG/2ML IJ SOLN
INTRAMUSCULAR | Status: AC
  Filled 2019-07-13: qty 2

## 2019-07-13 MED ORDER — CEFAZOLIN (ANCEF) 1 G IV SOLR: INTRAVENOUS | Status: AC | PRN

## 2019-07-13 MED ORDER — CEFAZOLIN SODIUM-DEXTROSE 2-4 GM/100ML-% IV SOLN
2.0000 g | INTRAVENOUS | Status: AC
  Administered 2019-07-13: 14:00:00 2 g via INTRAVENOUS

## 2019-07-13 MED ORDER — CEFAZOLIN SODIUM-DEXTROSE 2-4 GM/100ML-% IV SOLN
INTRAVENOUS | Status: AC
  Administered 2019-07-13: 2 g via INTRAVENOUS
  Filled 2019-07-13: qty 100

## 2019-07-13 MED ORDER — FENTANYL CITRATE (PF) 100 MCG/2ML IJ SOLN
INTRAMUSCULAR | Status: AC
  Filled 2019-07-13: qty 2

## 2019-07-13 MED ORDER — MIDAZOLAM HCL 2 MG/2ML IJ SOLN
INTRAMUSCULAR | Status: AC | PRN
  Administered 2019-07-13: 1 mg via INTRAVENOUS

## 2019-07-13 NOTE — H&P (Signed)
Referring Physician(s): Rossi,E  Supervising Physician: Arne Cleveland  Patient Status:  [facility]  Chief Complaint: "I'm getting my port out"   Subjective: Pt familiar to IR service from Port-A-Cath placement on 12/29/2017.  She has a history of endometrial carcinoma along with neuroendocrine tumor of the left ovary with mets to the peritoneum and staging surgery on 12/01/2017.  She has completed chemotherapy and presents today for Port-A-Cath removal.  She currently denies fever, headache, chest pain, dyspnea, cough, back pain, nausea, vomiting or bleeding. She does have some minimal abdominal discomfort along with occ loose stools. Past Medical History:  Diagnosis Date   Anemia    Dental bridge present    front upper tooth   endometrial ca dx'd 10/2017   Fibroid    Hypercholesteremia    Hypertension    followed by pcp   Nocturia    PONV (postoperative nausea and vomiting)    Past Surgical History:  Procedure Laterality Date   CHOLECYSTECTOMY OPEN  1974   DILATION AND CURETTAGE OF UTERUS  1980s   IR CV LINE INJECTION  01/26/2018   IR FLUORO GUIDE PORT INSERTION RIGHT  12/29/2017   IR US GUIDE VASC ACCESS RIGHT  12/29/2017   ROBOTIC ASSISTED TOTAL HYSTERECTOMY WITH BILATERAL SALPINGO OOPHERECTOMY Bilateral 12/01/2017   Procedure: XI ROBOTIC ASSISTED TOTAL HYSTERECTOMY WITH BILATERAL SALPINGO OOPHORECTOMY WITH SENTINAL LYMPH NODES;  Surgeon: Everitt Amber, MD;  Location: [facility];  Service: Gynecology;  Laterality: Bilateral;   STRABISMUS SURGERY  age 73   unilateral      Allergies: Fish oil, Simvastatin, Codeine, and Sulfa antibiotics  Medications: Prior to Admission medications   Medication Sig Start Date End Date Taking? Authorizing Provider  acetaminophen (TYLENOL) 500 MG tablet Take 1,000 mg by mouth 2 (two) times daily as needed for moderate pain or headache.   Yes [provider]  Ascorbic Acid (VITAMIN C) 100 MG tablet Take 100 mg by mouth daily.    Yes [provider]  benazepril-hydrochlorthiazide (LOTENSIN HCT) 20-12.5 MG tablet Take 1 tablet by mouth every morning. 01/06/16  Yes [provider]  calcium carbonate (TUMS - DOSED IN MG ELEMENTAL CALCIUM) 500 MG chewable tablet Chew 2 tablets by mouth daily.   Yes [provider]  ibuprofen (ADVIL) 100 MG/5ML suspension Take by mouth every 4 (four) hours as needed. Unknown strength   Yes [provider]  ibuprofen (ADVIL,MOTRIN) 200 MG tablet Take 400-600 mg by mouth 2 (two) times daily as needed for moderate pain.    Yes [provider]  magnesium oxide (MAG-OX) 400 MG tablet Take 250 mg by mouth daily. Started taking for leg cramps   Yes [provider]  Melatonin 5 MG TABS Take by mouth at bedtime as needed.   Yes [provider]  Multiple Vitamin (MULTIVITAMIN WITH MINERALS) TABS tablet Take 1 tablet by mouth daily.   Yes [provider]  alum & mag hydroxide-simeth (MAALOX/MYLANTA) 200-200-20 MG/5ML suspension Take by mouth every 6 (six) hours as needed for indigestion or heartburn.    [provider]  fluticasone (FLONASE) 50 MCG/ACT nasal spray Place 1 spray into both nostrils daily as needed for allergies or rhinitis.    [provider]  guaiFENesin (MUCINEX) 600 MG 12 hr tablet Take 600 mg by mouth 2 (two) times daily.    [provider]  hydrocortisone (ANUSOL-HC) 25 MG suppository Place 1 suppository (25 mg total) rectally 2 (two) times daily. Patient taking differently: Place 25 mg  rectally as needed.  02/22/18   Gery Pray, MD  lidocaine-prilocaine (EMLA) cream Apply to affected area once 12/27/17   Heath Lark, MD  ondansetron (ZOFRAN) 8 MG tablet Take 1 tablet (8 mg total) by mouth every 8 (eight) hours as needed for refractory nausea / vomiting. Start on day 3 after chemo. Patient not taking: Reported on 10/25/2018 12/27/17   Heath Lark, MD  prochlorperazine (COMPAZINE) 10 MG tablet  Take 1 tablet (10 mg total) by mouth every 6 (six) hours as needed (Nausea or vomiting). Patient not taking: Reported on 10/25/2018 12/27/17   Heath Lark, MD  traMADol (ULTRAM) 50 MG tablet Take 1 tablet (50 mg total) by mouth every 6 (six) hours as needed. Patient not taking: Reported on 10/25/2018 02/13/18   Heath Lark, MD     Vital Signs: Ht 5\' 4"  (1.626 m)    Wt 230 lb (104.3 kg)    BMI 39.48 kg/m   Physical Exam awake, alert.  Chest clear to auscultation bilaterally.  Clean, intact right chest wall Port-A-Cath.  Heart with regular rate and  rhythm.  Abdomen soft, positive bowel sounds, nontender. Ext with FROM.  Imaging: No results found.  Labs:  CBC: Recent Labs    08/03/18 0819 09/18/18 0931  WBC 6.2 6.6  HGB 10.2* 10.5*  HCT 31.1* 31.6*  PLT 215 230    COAGS: No results for input(s): INR, APTT in the last 8760 hours.  BMP: Recent Labs    08/03/18 0819 09/18/18 0931  NA 139 141  K 4.2 3.9  CL 102 103  CO2 26 25  GLUCOSE 91 90  BUN 26* 24*  CALCIUM 10.0 9.6  CREATININE 0.92 1.13*  GFRNONAA >60 49*  GFRAA >60 57*    LIVER FUNCTION TESTS: Recent Labs    08/03/18 0819 09/18/18 0931  BILITOT 0.3 0.3  AST 17 18  ALT 14 11  ALKPHOS 96 97  PROT 7.2 7.0  ALBUMIN 4.2 4.0    Assessment and Plan: Pt with history of endometrial carcinoma along with neuroendocrine tumor of the left ovary with mets to the peritoneum and staging surgery on 12/01/2017.  She has completed chemotherapy and presents today for Port-A-Cath removal. Details/risks of procedure, incl but not limited to, internal bleeding, infection, injury to adjacent structures d/w pt with her understanding and consent.    Electronically Signed: D. Rowe Robert, PA-C 07/13/2019, 1:01 PM   I spent a total of 20 minutes at the the patient's bedside AND on the patient's hospital floor or unit, greater than 50% of which was counseling/coordinating care for port a cath placement

## 2019-07-13 NOTE — Procedures (Signed)
  Procedure: R Port removal   EBL:   minimal Complications:  none immediate  See full dictation in [facility].  Dillard Cannon MD Main # [phone] Pager  [phone]

## 2019-07-13 NOTE — Discharge Instructions (Signed)
Implanted Port Removal, Care After °This sheet gives you information about how to care for yourself after your procedure. Your health care provider may also give you more specific instructions. If you have problems or questions, contact your health care provider. °What can I expect after the procedure? °After the procedure, it is common to have: °· Soreness or pain near your incision. °· Some swelling or bruising near your incision. °Follow these instructions at home: °Medicines °· Take over-the-counter and prescription medicines only as told by your health care provider. °· If you were prescribed an antibiotic medicine, take it as told by your health care provider. Do not stop taking the antibiotic even if you start to feel better. °Bathing °· Do not take baths, swim, or use a hot tub until your health care provider approves. Ask your health care provider if you can take showers. You may only be allowed to take sponge baths. °Incision care ° °· Follow instructions from your health care provider about how to take care of your incision. Make sure you: °? Wash your hands with soap and water before you change your bandage (dressing). If soap and water are not available, use hand sanitizer. °? Change your dressing as told by your health care provider. °? Keep your dressing dry. °? Leave stitches (sutures), skin glue, or adhesive strips in place. These skin closures may need to stay in place for 2 weeks or longer. If adhesive strip edges start to loosen and curl up, you may trim the loose edges. Do not remove adhesive strips completely unless your health care provider tells you to do that. °· Check your incision area every day for signs of infection. Check for: °? More redness, swelling, or pain. °? More fluid or blood. °? Warmth. °? Pus or a bad smell. °Driving ° °· Do not drive for 24 hours if you were given a medicine to help you relax (sedative) during your procedure. °· If you did not receive a sedative, ask your  health care provider when it is safe to drive. °Activity °· Return to your normal activities as told by your health care provider. Ask your health care provider what activities are safe for you. °· Do not lift anything that is heavier than 10 lb (4.5 kg), or the limit that you are told, until your health care provider says that it is safe. °· Do not do activities that involve lifting your arms over your head. °General instructions °· Do not use any products that contain nicotine or tobacco, such as cigarettes and e-cigarettes. These can delay healing. If you need help quitting, ask your health care provider. °· Keep all follow-up visits as told by your health care provider. This is important. °Contact a health care provider if: °· You have more redness, swelling, or pain around your incision. °· You have more fluid or blood coming from your incision. °· Your incision feels warm to the touch. °· You have pus or a bad smell coming from your incision. °· You have pain that is not relieved by your pain medicine. °Get help right away if you have: °· A fever or chills. °· Chest pain. °· Difficulty breathing. °Summary °· After the procedure, it is common to have pain, soreness, swelling, or bruising near your incision. °· If you were prescribed an antibiotic medicine, take it as told by your health care provider. Do not stop taking the antibiotic even if you start to feel better. °· Do not drive for 24 hours   if you were given a sedative during your procedure. °· Return to your normal activities as told by your health care provider. Ask your health care provider what activities are safe for you. °This information is not intended to replace advice given to you by your health care provider. Make sure you discuss any questions you have with your health care provider. °Document Released: 09/15/2015 Document Revised: 11/17/2017 Document Reviewed: 11/17/2017 °Elsevier Patient Education © 2020 [employer] ° ° ° °Moderate  Conscious Sedation, Adult, Care After °These instructions provide you with information about caring for yourself after your procedure. Your health care provider may also give you more specific instructions. Your treatment has been planned according to current medical practices, but problems sometimes occur. Call your health care provider if you have any problems or questions after your procedure. °What can I expect after the procedure? °After your procedure, it is common: °· To feel sleepy for several hours. °· To feel clumsy and have poor balance for several hours. °· To have poor judgment for several hours. °· To vomit if you eat too soon. °Follow these instructions at home: °For at least 24 hours after the procedure: ° °· Do not: °? Participate in activities where you could fall or become injured. °? Drive. °? Use heavy machinery. °? Drink alcohol. °? Take sleeping pills or medicines that cause drowsiness. °? Make important decisions or sign legal documents. °? Take care of children on your own. °· Rest. °Eating and drinking °· Follow the diet recommended by your health care provider. °· If you vomit: °? Drink water, juice, or soup when you can drink without vomiting. °? Make sure you have little or no nausea before eating solid foods. °General instructions °· Have a responsible adult stay with you until you are awake and alert. °· Take over-the-counter and prescription medicines only as told by your health care provider. °· If you smoke, do not smoke without supervision. °· Keep all follow-up visits as told by your health care provider. This is important. °Contact a health care provider if: °· You keep feeling nauseous or you keep vomiting. °· You feel light-headed. °· You develop a rash. °· You have a fever. °Get help right away if: °· You have trouble breathing. °This information is not intended to replace advice given to you by your health care provider. Make sure you discuss any questions you have with your  health care provider. °Document Released: 07/25/2013 Document Revised: 09/16/2017 Document Reviewed: 01/24/2016 °Elsevier Patient Education © 2020 [employer] ° °

## 2019-07-25 ENCOUNTER — Other Ambulatory Visit: Payer: PRIVATE HEALTH INSURANCE

## 2019-07-31 ENCOUNTER — Other Ambulatory Visit: Payer: Self-pay | Admitting: Internal Medicine

## 2019-07-31 DIAGNOSIS — E042 Nontoxic multinodular goiter: Secondary | ICD-10-CM

## 2019-08-13 ENCOUNTER — Other Ambulatory Visit: Payer: Self-pay | Admitting: Family Medicine

## 2019-08-13 DIAGNOSIS — E2839 Other primary ovarian failure: Secondary | ICD-10-CM

## 2019-08-20 ENCOUNTER — Ambulatory Visit
Admission: RE | Admit: 2019-08-20 | Discharge: 2019-08-20 | Disposition: A | Discharge status: Home / Self Care | Payer: PRIVATE HEALTH INSURANCE | Source: Ambulatory Visit | Attending: Internal Medicine | Admitting: Internal Medicine

## 2019-08-20 DIAGNOSIS — E042 Nontoxic multinodular goiter: Secondary | ICD-10-CM

## 2019-09-05 ENCOUNTER — Other Ambulatory Visit: Payer: PRIVATE HEALTH INSURANCE

## 2019-09-24 ENCOUNTER — Ambulatory Visit: Payer: Self-pay | Admitting: Radiation Oncology

## 2019-09-27 IMAGING — US US FNA BIOPSY THYROID 1ST LESION
1 series · 13 of 14 positions shown · non-contrast
Comparison: 08/06/2018

MEDICATIONS:
1% lidocaine local

COMPLICATIONS:
None immediate.

INDICATION: Indeterminate thyroid nodule

EXAM:
ULTRASOUND GUIDED FINE NEEDLE ASPIRATION OF INDETERMINATE THYROID
NODULE
TECHNIQUE: Informed written consent was obtained from the patient after a
discussion of the risks, benefits and alternatives to treatment.
Questions regarding the procedure were encouraged and answered. A
timeout was performed prior to the initiation of the procedure.

[Series 1: us fna biopsy thyroid 1st lesion · 0.07mm/px · 14 acquisitions, 13 frames shown]
[im 1/14]
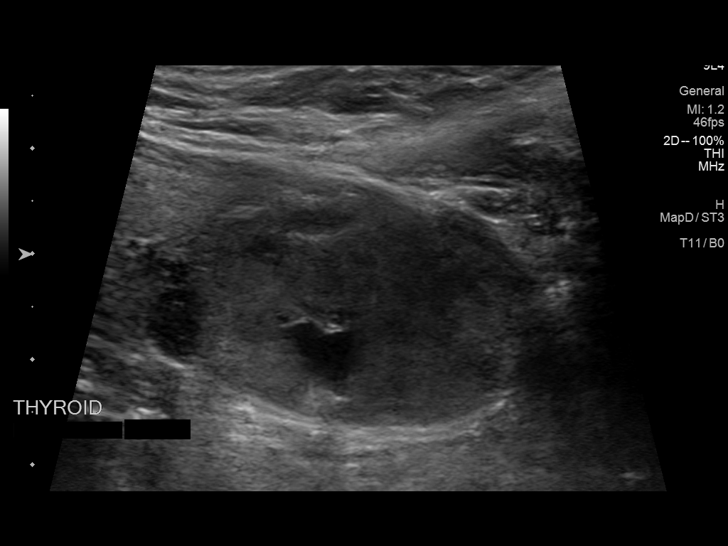
[im 2/14]
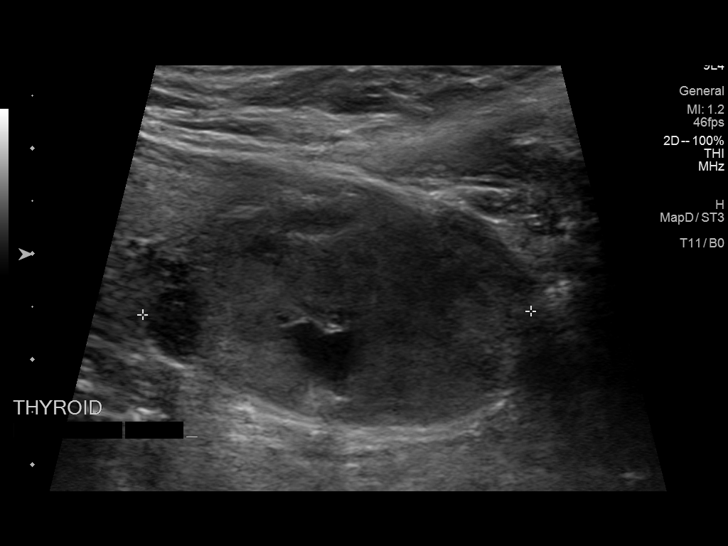
[im 3/14]
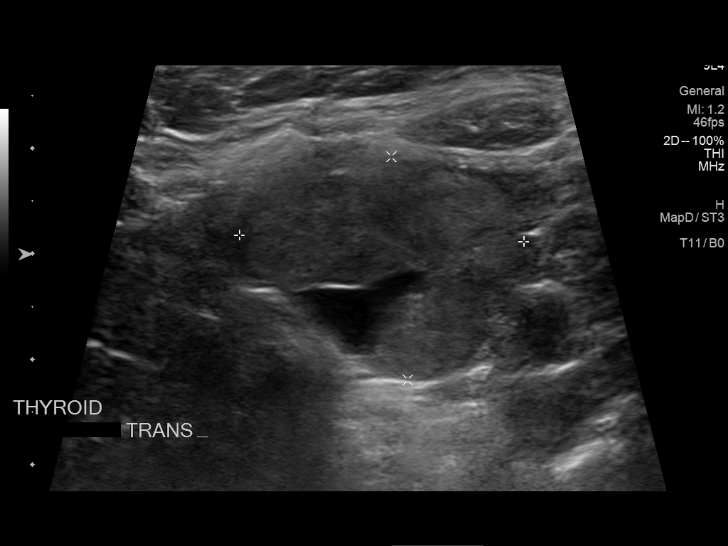
[im 4/14]
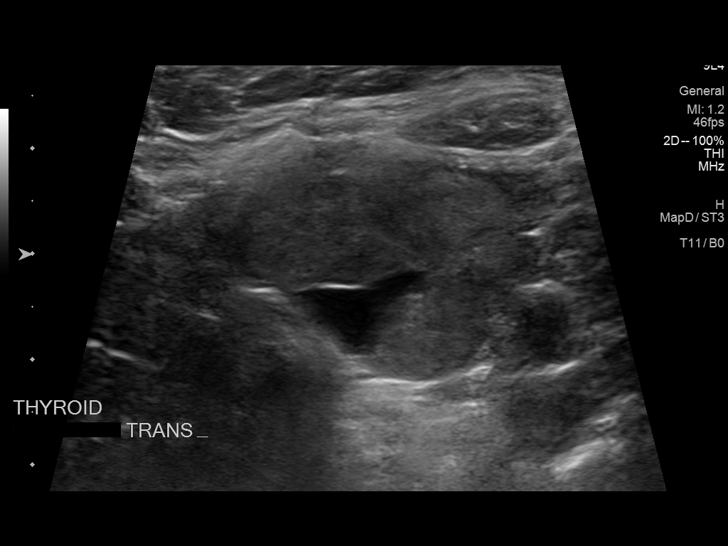
[im 5/14]
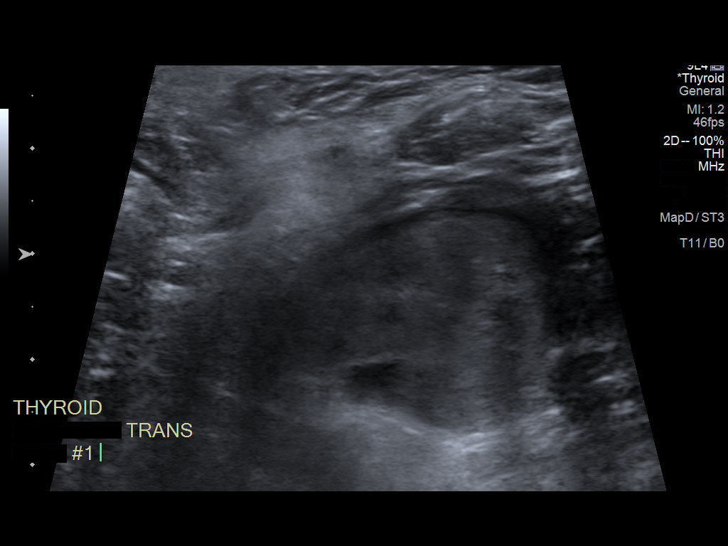
[im 6/14]
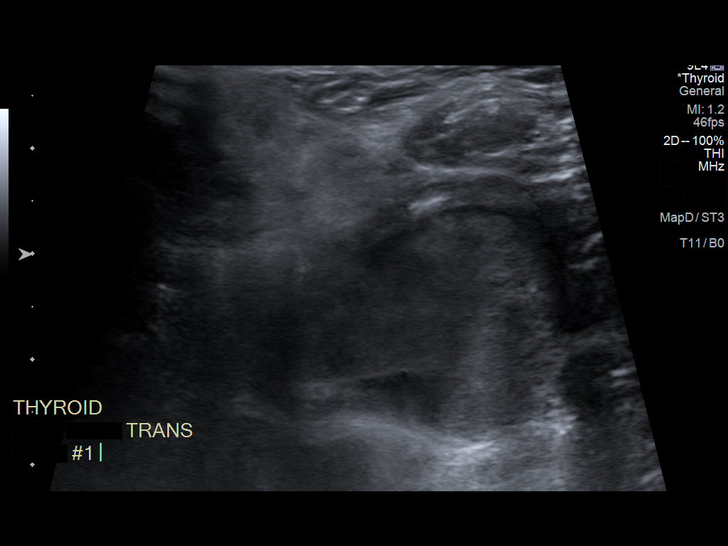
[im 8/14]
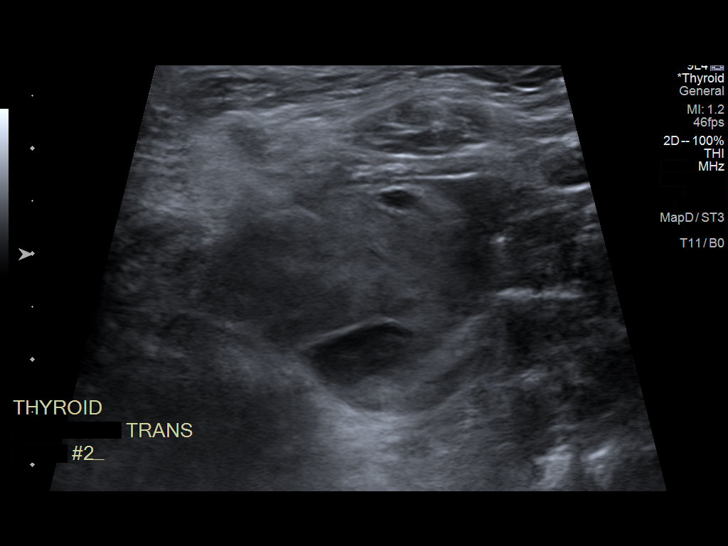
[im 9/14]
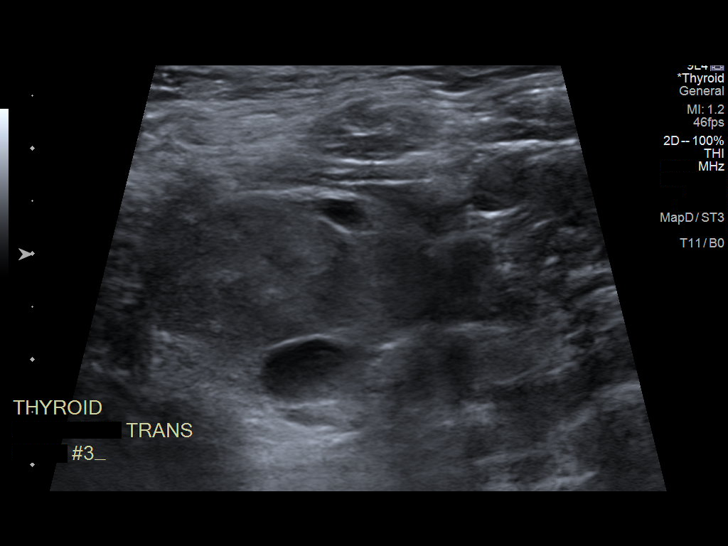
[im 10/14]
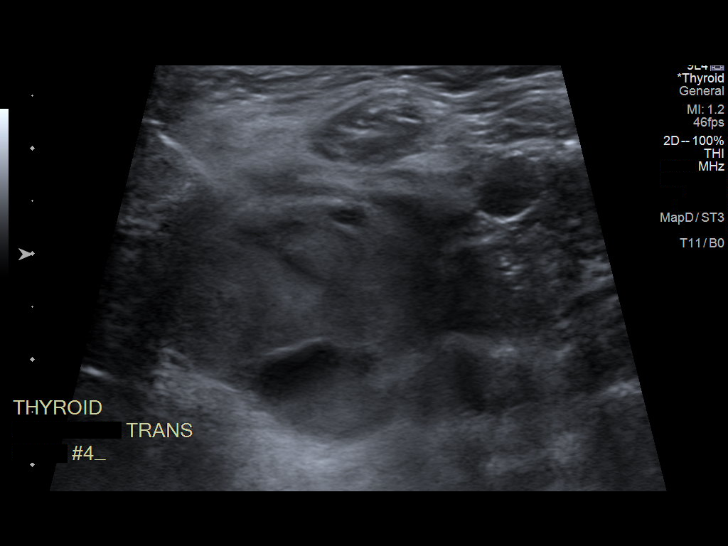
[im 11/14]
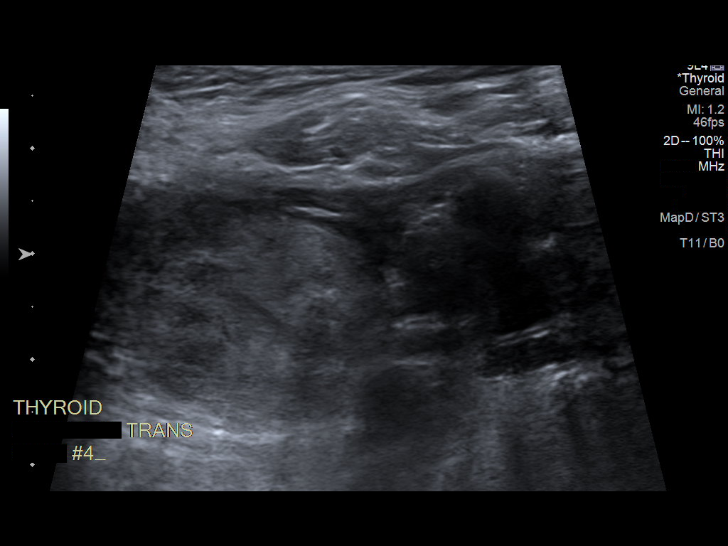
[im 12/14]
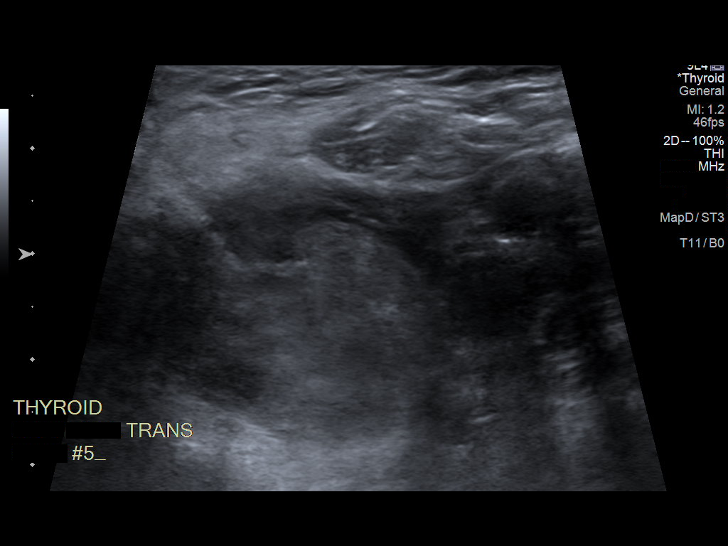
[im 13/14]
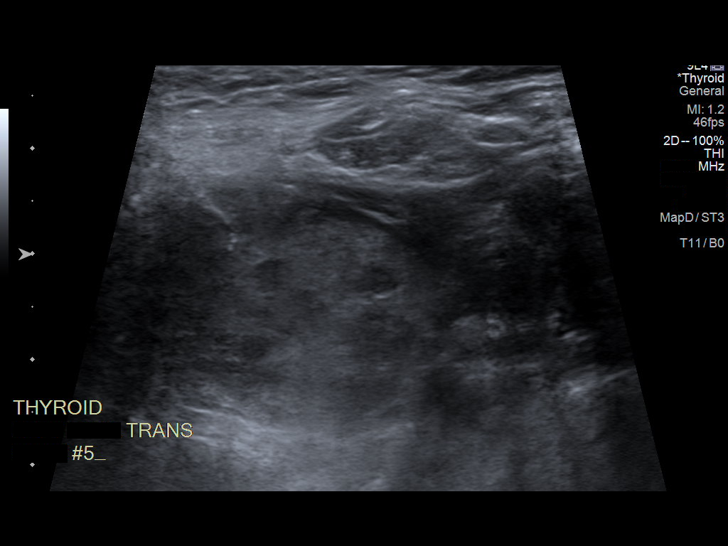
[im 14/14]
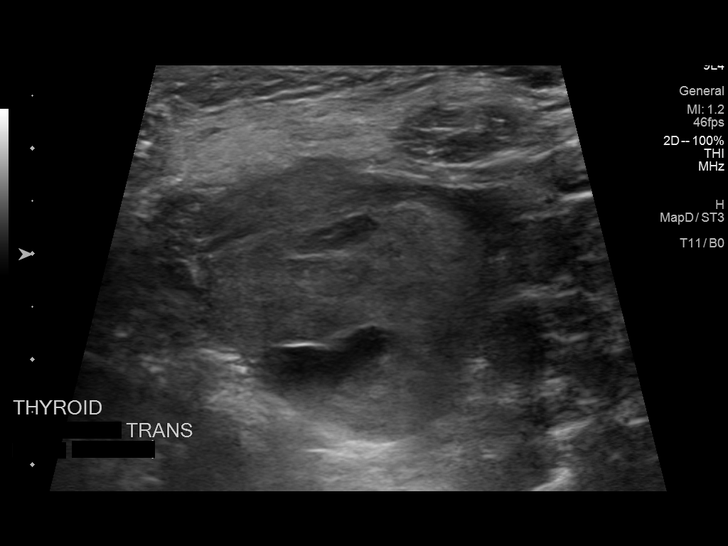

[13 of 14 positions shown; findings below may reference images not displayed]

Pre-procedural ultrasound scanning demonstrated unchanged size and
appearance of the indeterminate nodule within the left inferior
thyroid

The procedure was planned. The neck was prepped in the usual sterile
fashion, and a sterile drape was applied covering the operative
field. A timeout was performed prior to the initiation of the
procedure. Local anesthesia was provided with 1% lidocaine.

Under direct ultrasound guidance, 5 FNA biopsies were performed of
the 3.2 cm left inferior TR 3 nodule with a 25 gauge needle.
Multiple ultrasound images were saved for procedural documentation
purposes. The samples were prepared and submitted to pathology.

Limited post procedural scanning was negative for hematoma or
additional complication. Dressings were placed. The patient
tolerated the above procedures procedure well without immediate
postprocedural complication.
FINDINGS: Nodule reference number based on prior diagnostic ultrasound: 4

Maximum size: 3.2 cm

Location: Left; Inferior

ACR TI-RADS risk category: TR3 (3 points)

Reason for biopsy: meets ACR TI-RADS criteria

Ultrasound imaging confirms appropriate placement of the needles
within the thyroid nodule.
IMPRESSION: Technically successful ultrasound guided fine needle aspiration of
3.2 cm left inferior TR 3 nodule (nodule 4).

## 2019-10-17 ENCOUNTER — Other Ambulatory Visit: Payer: PRIVATE HEALTH INSURANCE

## 2019-10-18 ENCOUNTER — Ambulatory Visit
Admission: RE | Admit: 2019-10-18 | Discharge: 2019-10-18 | Disposition: A | Discharge status: Home / Self Care | Payer: PRIVATE HEALTH INSURANCE | Source: Ambulatory Visit | Attending: Radiation Oncology | Admitting: Radiation Oncology

## 2019-10-18 ENCOUNTER — Other Ambulatory Visit: Payer: Self-pay

## 2019-10-18 ENCOUNTER — Encounter: Payer: Self-pay | Admitting: Radiation Oncology

## 2019-10-18 VITALS — BP 140/69 | HR 55 | Temp 98.3°F | Resp 18 | Ht 64.0 in | Wt 229.0 lb

## 2019-10-18 DIAGNOSIS — Z79899 Other long term (current) drug therapy: Secondary | ICD-10-CM | POA: Diagnosis not present

## 2019-10-18 DIAGNOSIS — Z923 Personal history of irradiation: Secondary | ICD-10-CM | POA: Insufficient documentation

## 2019-10-18 DIAGNOSIS — C541 Malignant neoplasm of endometrium: Secondary | ICD-10-CM

## 2019-10-18 DIAGNOSIS — Z8542 Personal history of malignant neoplasm of other parts of uterus: Secondary | ICD-10-CM | POA: Insufficient documentation

## 2019-10-18 NOTE — Progress Notes (Signed)
Radiation Oncology         [phone] ________________________________  Name: Connie Simmons MRN: [id number]  Date: 10/18/2019  DOB: 11-23-1945  Follow-Up Visit Note  CC: Kelton Pillar, MD  Heath Lark, MD    ICD-10-CM   1. Endometrial cancer (HCC)  C54.1     Diagnosis:   73 y.o. 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  She was last seen by Dr. Denman George on 06/20/2019 with no evidence of disease on clinical exam. She is scheduled for follow up abdomen/pelvis CT on 10/23/2019.  On review of systems, she denies any pelvic or abdominal pain.  She denies any bloating or vaginal bleeding.  She has resumed using her vaginal dilator.  She denies any bleeding after use of the  dilator  ALLERGIES:  is allergic to fish oil; simvastatin; codeine; and sulfa antibiotics.  Meds: Current Outpatient Medications  Medication Sig Dispense Refill  . acetaminophen (TYLENOL) 500 MG tablet Take 1,000 mg by mouth 2 (two) times daily as needed for moderate pain or headache.    Marland Kitchen alum & mag hydroxide-simeth (MAALOX/MYLANTA) 200-200-20 MG/5ML suspension Take by mouth every 6 (six) hours as needed for indigestion or heartburn.    . benazepril-hydrochlorthiazide (LOTENSIN HCT) 20-12.5 MG tablet Take 1 tablet by mouth every morning.  1  . calcium carbonate (TUMS - DOSED IN MG ELEMENTAL CALCIUM) 500 MG chewable tablet Chew 2 tablets by mouth daily.    . fluticasone (FLONASE) 50 MCG/ACT nasal spray Place 1 spray into both nostrils daily as needed for allergies or rhinitis.    Marland Kitchen guaiFENesin (MUCINEX) 600 MG 12 hr tablet Take 600 mg by mouth 2 (two) times daily.    . hydrocortisone (ANUSOL-HC) 25 MG suppository Place 1  suppository (25 mg total) rectally 2 (two) times daily. (Patient taking differently: Place 25 mg rectally as needed. ) 12 suppository 0  . ibuprofen (ADVIL,MOTRIN) 200 MG tablet Take 400-600 mg by mouth 2 (two) times daily as needed for moderate pain.     . magnesium oxide (MAG-OX) 400 MG tablet Take 250 mg by mouth daily. Started taking for leg cramps    . Melatonin 5 MG TABS Take by mouth at bedtime as needed.    . Multiple Vitamin (MULTIVITAMIN WITH MINERALS) TABS tablet Take 1 tablet by mouth daily.    . Ascorbic Acid (VITAMIN C) 100 MG tablet Take 100 mg by mouth daily.    . ondansetron (ZOFRAN) 8 MG tablet Take 1 tablet (8 mg total) by mouth every 8 (eight) hours as needed for refractory nausea / vomiting. Start on day 3 after chemo. (Patient not taking: Reported on 10/25/2018) 30 tablet 1  . prochlorperazine (COMPAZINE) 10 MG tablet Take 1 tablet (10 mg total) by mouth every 6 (six) hours as needed (Nausea or vomiting). (Patient not taking: Reported on 10/25/2018) 30 tablet 1  . traMADol (ULTRAM) 50 MG tablet Take 1 tablet (50 mg total) by mouth every 6 (six) hours as needed. (Patient not taking: Reported on 10/25/2018) 60 tablet 0   No current facility-administered medications for this encounter.    Physical Findings: The patient is in no acute distress. Patient is alert and oriented.  height is 5\' 4"  (1.626 m)  and weight is 229 lb (103.9 kg). Her temporal temperature is 98.3 F (36.8 C). Her blood pressure is 140/69 and her pulse is 55 (abnormal). Her respiration is 18 and oxygen saturation is 100%.   Lungs are clear to auscultation bilaterally. Heart has regular rate and rhythm. No palpable cervical, supraclavicular, or axillary adenopathy. Abdomen soft, non-tender, normal bowel sounds.  On pelvic examination the external genitalia were unremarkable. A speculum exam was performed. There are no mucosal lesions noted in the vaginal vault. On bimanual examination there were no pelvic masses  appreciated.  No adhesions appreciated or agglutination.  Lab Findings: Lab Results  Component Value Date   WBC 7.9 07/13/2019   HGB 11.3 (L) 07/13/2019   HCT 36.9 07/13/2019   MCV 94.4 07/13/2019   PLT 237 07/13/2019    Radiographic Findings: No results found.  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   No evidence of recurrence on exam. Clinically she seems to be doing very well at this time.   Plan:  Patient will follow up in radiation oncology in 6 months and will see Dr. Denman George in 3 months.  CT scans early next month for follow-up of her ovarian cancer.  ____________________________________  Blair Promise, PhD, MD   This document serves as a record of services personally performed by Gery Pray, MD. It was created on his behalf by Wilburn Mylar, a trained medical scribe. The creation of this record is based on the scribe's personal observations and the provider's statements to them. This document has been checked and approved by the attending provider.

## 2019-10-18 NOTE — Progress Notes (Signed)
Pt presents today for f/u with Dr. Sondra Come. Pt denies c/o pain. Pt denies dysuria/hematuria. Pt denies vaginal bleeding/discharge. Pt denies rectal bleeding. Pt reports occasional diarrhea, attributed to diet. Pt denies abdominal bloating, N/V.  BP 140/69 (BP Location: Left Arm, Patient Position: Sitting)   Pulse (!) 55   Temp 98.3 F (36.8 C) (Temporal)   Resp 18   Ht 5\' 4"  (1.626 m)   Wt 229 lb (103.9 kg)   SpO2 100%   BMI 39.31 kg/m   Wt Readings from Last 3 Encounters:  10/18/19 229 lb (103.9 kg)  07/13/19 230 lb (104.3 kg)  06/20/19 230 lb 6.4 oz (104.5 kg)   Loma Sousa, RN BSN

## 2019-10-18 NOTE — Patient Instructions (Signed)
Coronavirus (COVID-19) Are you at risk?  Are you at risk for the Coronavirus (COVID-19)?  To be considered HIGH RISK for Coronavirus (COVID-19), you have to meet the following criteria:  . Traveled to [place], [place], [place], [place] or [place]; or in the [place] to [place], [place], [place], or [place]; and have fever, cough, and shortness of breath within the last 2 weeks of travel OR . Been in close contact with a person diagnosed with COVID-19 within the last 2 weeks and have fever, cough, and shortness of breath . IF YOU DO NOT MEET THESE CRITERIA, YOU ARE CONSIDERED LOW RISK FOR COVID-19.  What to do if you are HIGH RISK for COVID-19?  . If you are having a medical emergency, call 911. . Seek medical care right away. Before you go to a doctor's office, urgent care or emergency department, call ahead and tell them about your recent travel, contact with someone diagnosed with COVID-19, and your symptoms. You should receive instructions from your physician's office regarding next steps of care.  . When you arrive at healthcare provider, tell the healthcare staff immediately you have returned from visiting [place], [place], [place], [place] or [place]; or traveled in the [place] to [place], [place], [place], or [place]; in the last two weeks or you have been in close contact with a person diagnosed with COVID-19 in the last 2 weeks.   . Tell the health care staff about your symptoms: fever, cough and shortness of breath. . After you have been seen by a medical provider, you will be either: o Tested for (COVID-19) and discharged home on quarantine except to seek medical care if symptoms worsen, and asked to  - Stay home and avoid contact with others until you get your results (4-5 days)  - Avoid travel on public transportation if possible (such as bus, train, or airplane) or o Sent to the Emergency Department by EMS for evaluation, COVID-19 testing, and possible  admission depending on your condition and test results.  What to do if you are LOW RISK for COVID-19?  Reduce your risk of any infection by using the same precautions used for avoiding the common cold or flu:  . Wash your hands often with soap and warm water for at least 20 seconds.  If soap and water are not readily available, use an alcohol-based hand sanitizer with at least 60% alcohol.  . If coughing or sneezing, cover your mouth and nose by coughing or sneezing into the elbow areas of your shirt or coat, into a tissue or into your sleeve (not your hands). . Avoid shaking hands with others and consider head nods or verbal greetings only. . Avoid touching your eyes, nose, or mouth with unwashed hands.  . Avoid close contact with people who are sick. . Avoid places or events with large numbers of people in one location, like concerts or sporting events. . Carefully consider travel plans you have or are making. . If you are planning any travel outside or inside the [place], visit the CDC's Travelers' Health webpage for the latest health notices. . If you have some symptoms but not all symptoms, continue to monitor at home and seek medical attention if your symptoms worsen. . If you are having a medical emergency, call 911.   ADDITIONAL HEALTHCARE OPTIONS FOR PATIENTS  [facility] / e-Visit: [email or web address]         [facility]: [phone]  [facility]: [phone]                   [facility]: [phone]   

## 2019-10-22 ENCOUNTER — Other Ambulatory Visit: Payer: Self-pay

## 2019-10-22 ENCOUNTER — Telehealth: Payer: Self-pay

## 2019-10-22 ENCOUNTER — Inpatient Hospital Stay: Payer: PRIVATE HEALTH INSURANCE | Attending: Radiation Oncology

## 2019-10-22 DIAGNOSIS — C7B8 Other secondary neuroendocrine tumors: Secondary | ICD-10-CM | POA: Insufficient documentation

## 2019-10-22 DIAGNOSIS — C7A8 Other malignant neuroendocrine tumors: Secondary | ICD-10-CM | POA: Diagnosis not present

## 2019-10-22 DIAGNOSIS — Z90722 Acquired absence of ovaries, bilateral: Secondary | ICD-10-CM | POA: Insufficient documentation

## 2019-10-22 DIAGNOSIS — C541 Malignant neoplasm of endometrium: Secondary | ICD-10-CM | POA: Insufficient documentation

## 2019-10-22 DIAGNOSIS — Z9221 Personal history of antineoplastic chemotherapy: Secondary | ICD-10-CM | POA: Insufficient documentation

## 2019-10-22 DIAGNOSIS — Z90710 Acquired absence of both cervix and uterus: Secondary | ICD-10-CM | POA: Diagnosis not present

## 2019-10-22 DIAGNOSIS — Z923 Personal history of irradiation: Secondary | ICD-10-CM | POA: Insufficient documentation

## 2019-10-22 LAB — BASIC METABOLIC PANEL - CANCER CENTER ONLY
Anion gap: 13 (ref 5–15)
BUN: 20 mg/dL (ref 8–23)
CO2: 26 mmol/L (ref 22–32)
Calcium: 9.3 mg/dL (ref 8.9–10.3)
Chloride: 99 mmol/L (ref 98–111)
Creatinine: 1.1 mg/dL — ABNORMAL HIGH (ref 0.44–1.00)
GFR, Est AFR Am: 58 mL/min — ABNORMAL LOW (ref >60)
GFR, Estimated: 50 mL/min — ABNORMAL LOW (ref >60)
Glucose, Bld: 84 mg/dL (ref 70–99)
Potassium: 3.6 mmol/L (ref 3.5–5.1)
Sodium: 138 mmol/L (ref 135–145)

## 2019-10-22 NOTE — Telephone Encounter (Signed)
Reviewed directions regarding prep for CT tomorrow at 1000. Pt misplaced instructions.

## 2019-10-23 ENCOUNTER — Encounter (HOSPITAL_COMMUNITY): Payer: Self-pay

## 2019-10-23 ENCOUNTER — Ambulatory Visit (HOSPITAL_COMMUNITY)
Admission: RE | Admit: 2019-10-23 | Discharge: 2019-10-23 | Disposition: A | Discharge status: Home / Self Care | Payer: No Typology Code available for payment source | Source: Ambulatory Visit | Attending: Gynecologic Oncology | Admitting: Gynecologic Oncology

## 2019-10-23 DIAGNOSIS — C541 Malignant neoplasm of endometrium: Secondary | ICD-10-CM | POA: Insufficient documentation

## 2019-10-23 DIAGNOSIS — C7A8 Other malignant neuroendocrine tumors: Secondary | ICD-10-CM

## 2019-10-23 MED ORDER — SODIUM CHLORIDE (PF) 0.9 % IJ SOLN
INTRAMUSCULAR | Status: AC
  Filled 2019-10-23: qty 50

## 2019-10-23 MED ORDER — IOHEXOL 300 MG/ML  SOLN
100.0000 mL | Freq: Once | INTRAMUSCULAR | Status: AC | PRN
  Administered 2019-10-23: 100 mL via INTRAVENOUS

## 2019-10-25 ENCOUNTER — Telehealth: Payer: Self-pay

## 2019-10-25 NOTE — Telephone Encounter (Signed)
I spoke with Connie Simmons this am.  I let her know her CT scan showed no metastasis.  It did show diverticulosis.  She verbalized understanding

## 2019-10-30 ENCOUNTER — Other Ambulatory Visit: Payer: PRIVATE HEALTH INSURANCE

## 2019-11-28 ENCOUNTER — Other Ambulatory Visit: Payer: PRIVATE HEALTH INSURANCE

## 2019-11-29 ENCOUNTER — Ambulatory Visit: Payer: PRIVATE HEALTH INSURANCE

## 2020-01-09 ENCOUNTER — Inpatient Hospital Stay: Payer: PRIVATE HEALTH INSURANCE | Attending: Radiation Oncology | Admitting: Gynecologic Oncology

## 2020-01-09 ENCOUNTER — Other Ambulatory Visit: Payer: PRIVATE HEALTH INSURANCE

## 2020-01-09 ENCOUNTER — Other Ambulatory Visit: Payer: Self-pay

## 2020-01-09 VITALS — BP 108/61 | HR 55 | Temp 98.5°F | Resp 17 | Ht 64.0 in | Wt 228.4 lb

## 2020-01-09 DIAGNOSIS — I10 Essential (primary) hypertension: Secondary | ICD-10-CM | POA: Diagnosis not present

## 2020-01-09 DIAGNOSIS — Z90710 Acquired absence of both cervix and uterus: Secondary | ICD-10-CM | POA: Diagnosis not present

## 2020-01-09 DIAGNOSIS — Z791 Long term (current) use of non-steroidal anti-inflammatories (NSAID): Secondary | ICD-10-CM | POA: Diagnosis not present

## 2020-01-09 DIAGNOSIS — Z8542 Personal history of malignant neoplasm of other parts of uterus: Secondary | ICD-10-CM | POA: Diagnosis not present

## 2020-01-09 DIAGNOSIS — Z90722 Acquired absence of ovaries, bilateral: Secondary | ICD-10-CM

## 2020-01-09 DIAGNOSIS — Z8042 Family history of malignant neoplasm of prostate: Secondary | ICD-10-CM | POA: Diagnosis not present

## 2020-01-09 DIAGNOSIS — Z6839 Body mass index (BMI) 39.0-39.9, adult: Secondary | ICD-10-CM | POA: Diagnosis not present

## 2020-01-09 DIAGNOSIS — Z923 Personal history of irradiation: Secondary | ICD-10-CM

## 2020-01-09 DIAGNOSIS — C541 Malignant neoplasm of endometrium: Secondary | ICD-10-CM

## 2020-01-09 DIAGNOSIS — Z800 Family history of malignant neoplasm of digestive organs: Secondary | ICD-10-CM | POA: Insufficient documentation

## 2020-01-09 DIAGNOSIS — Z9079 Acquired absence of other genital organ(s): Secondary | ICD-10-CM

## 2020-01-09 DIAGNOSIS — E7800 Pure hypercholesterolemia, unspecified: Secondary | ICD-10-CM | POA: Insufficient documentation

## 2020-01-09 DIAGNOSIS — Z79899 Other long term (current) drug therapy: Secondary | ICD-10-CM | POA: Insufficient documentation

## 2020-01-09 DIAGNOSIS — E669 Obesity, unspecified: Secondary | ICD-10-CM | POA: Insufficient documentation

## 2020-01-09 DIAGNOSIS — Z9221 Personal history of antineoplastic chemotherapy: Secondary | ICD-10-CM | POA: Diagnosis not present

## 2020-01-09 NOTE — Progress Notes (Signed)
Follow-up Note: Gyn-Onc  Consult was initially requested by Dr. Nelda Marseille for the evaluation of Connie Simmons 74 y.o. female  CC:  Chief Complaint  Patient presents with  . Endometrial cancer ([place])    Follow up    Assessment/Plan:  Connie Simmons  is a 74 y.o.  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 S/p staging surgery on 12/01/17. MSI high endometrial tumor. Adjuvant carboplatin and paclitaxel x 6 and vaginal brachytherapy completed on 05/08/18.  Complete clinical response on imaging.  Annual CT imaging to follow-up neuroendocrine tumor -recommend CT in January, 2022 unless new symptom develop.  She will see Dr Sondra Come in 3 months and me back in 9 months (6 monthly checks thereafter until July, 2024).  HPI: Connie Simmons is a 75 year old P2 who is seen in consultation at the request of Dr Nelda Marseille for serous endometrial cancer in the setting of obesity (BMI 39kg/m2).  The patient experienced vaginal bleeding for approximately 6 months though initially it was only light spotting. In January, 2019 it became heavier. She was seen at [facility] on 11/04/17 and prescribed Megace. She was then seen by Dr Nelda Marseille and an endometrial biopsy was performed on 11/10/17 which showed high grade serous endometrial cancer. TVUS showed a uterus measuring 6.3x11.2x7cm. The endometrium was 4cm thick.   The patient has a family history of a grandfather with colon cancer. She has had 2 prior SVD's and an open cholecystectomy.   On 11/23/17 preoperative CT abdo/pelvis showed signs of metastatic peritoneal disease with grossly thickened and heterogeneous endometrium consistent with known endometrial carcinoma. No CT findings to suggest serosal or extra uterine direct extension. No pelvic adenopathy. There are worrisome small peritoneal nodules and a small mesenteric nodal mass in the root of the small bowel  mesentery suspicious for metastatic disease. No metastatic pulmonary nodules at the lung bases or evidence of solid abdominal organ metastatic disease. On 12/01/17 she underwent robot assisted total hysterectomy, BSO, SLN biopsy and debulking of larger peritoneal implants. Final pathology revealed a 3.5cm high grade serous endometrial cancer with outer half myometrial invasion (1.2 of 2cm), negative LVSI and negative lymph nodes. The tumor was MSI high. There was an incidentally found neuroendocrine tumor on the left ovary with the following description:  The left ovary shows a 1 cm nodule of neoplasm characterized by sheets of cells with uniform, round to oval nuclei with rare mitotic figures. Iimmunohistochemistry is performed and the left ovarian tumor is positive with cytokeratin AE1/AE3, CD56, chromogranin, synaptophysin and CDX-2. The tumor is negative with inhibin, WT-1, calretinin, CD99, cytokerain 20, cytokeratin 7, estrogen receptor, progesterone receptor, vimentin, and p53. The morphology and immunophenotype are consistent with a low to intermediate grade neuroendocrine tumor and CDX-2 positivity suggests possible gastrointestinal origin. There is a similar 0.6 cm nodule in the paraovarian connective tissue and the peritoneal nodule (part 2) has identical features. The peritoneal nodules that were resected were also consistent with neuroendocrine tumor (not endometrial serous cancer).  Her case was reviewed at multidisciplinary tumor board and she was determined to have a primary ovarian neuroendocrine tumor and separate stage IB high grade serous endometrial cancer. She was recommended to undergo repeat staging imaging, GI evaluation, and then adjuvant therapy for the endometrial cancer.   Follow-up post-operative imaging of the chest abdomen and pelvis on 12/21/17 showed a 2.4cm left thyroid lobe nodule. A few tiny scattered bilateral  upper lobe pulmonary nodules are seen measuring less than 5m,  which are indeterminate. There was no residual or metastatic carcinoma seen within the abdomen or pelvis.  Given the high risk uterine pathology she was recommended to receive 6 cycles of adjuvant carb/taxol chemotherapy and vaginal brachytherapy.  She completed 6 cycles of adjuvant carboplatin and paclitaxel chemotherapy on 05/08/18. Vaginal brachytherapy (30Gy in 5 fractions) was completed on 03/08/18.  Post treatment imaging with CT abd/pelvis on 10/23/17 showed no evidence of persistent/recurrent disease. She had diverticulosis seen.  CT abd/pelvis on 10/23/19 showed status post hysterectomy and bilateral salpingo-oophorectomy. No evidence for recurrent or metastatic disease in the abdomen or pelvis. A previously described small postoperative fluid collection about the left pelvic sidewall has resolved. Sigmoid diverticulosis without evidence for diverticulitis.  Interval Hx: She has been doing well with no symptoms of recurrence.  Current Meds:   Outpatient Encounter Medications as of 01/09/2020  Medication Sig  . acetaminophen (TYLENOL) 500 MG tablet Take 1,000 mg by mouth 2 (two) times daily as needed for moderate pain or headache.  .Marland Kitchenalum & mag hydroxide-simeth (MAALOX/MYLANTA) 200-200-20 MG/5ML suspension Take by mouth every 6 (six) hours as needed for indigestion or heartburn.  . Ascorbic Acid (VITAMIN C) 100 MG tablet Take 100 mg by mouth daily.  . benazepril-hydrochlorthiazide (LOTENSIN HCT) 20-12.5 MG tablet Take 1 tablet by mouth every morning.  . calcium carbonate (TUMS - DOSED IN MG ELEMENTAL CALCIUM) 500 MG chewable tablet Chew 2 tablets by mouth daily.  . fluticasone (FLONASE) 50 MCG/ACT nasal spray Place 1 spray into both nostrils daily as needed for allergies or rhinitis.  .Marland Kitchenibuprofen (ADVIL,MOTRIN) 200 MG tablet Take 400-600 mg by mouth 2 (two) times daily as needed for moderate pain.   . magnesium oxide (MAG-OX) 400 MG tablet Take 250 mg by mouth daily. Started taking for leg  cramps  . Multiple Vitamin (MULTIVITAMIN WITH MINERALS) TABS tablet Take 1 tablet by mouth daily.  . [DISCONTINUED] guaiFENesin (MUCINEX) 600 MG 12 hr tablet Take 600 mg by mouth 2 (two) times daily.  . [DISCONTINUED] hydrocortisone (ANUSOL-HC) 25 MG suppository Place 1 suppository (25 mg total) rectally 2 (two) times daily. (Patient not taking: Reported on 01/09/2020)  . [DISCONTINUED] Melatonin 5 MG TABS Take by mouth at bedtime as needed.  . [DISCONTINUED] ondansetron (ZOFRAN) 8 MG tablet Take 1 tablet (8 mg total) by mouth every 8 (eight) hours as needed for refractory nausea / vomiting. Start on day 3 after chemo. (Patient not taking: Reported on 10/25/2018)  . [DISCONTINUED] prochlorperazine (COMPAZINE) 10 MG tablet Take 1 tablet (10 mg total) by mouth every 6 (six) hours as needed (Nausea or vomiting). (Patient not taking: Reported on 10/25/2018)  . [DISCONTINUED] traMADol (ULTRAM) 50 MG tablet Take 1 tablet (50 mg total) by mouth every 6 (six) hours as needed. (Patient not taking: Reported on 10/25/2018)   No facility-administered encounter medications on file as of 01/09/2020.    Allergy:  Allergies  Allergen Reactions  . Fish Oil Nausea Only    Abdominal pain  . Simvastatin Other (See Comments)    Abdominal pain  . Codeine Itching  . Sulfa Antibiotics Itching and Rash    Social Hx:   Social History   Socioeconomic History  . Marital status: Divorced    Spouse name: Not on file  . Number of children: Not on file  . Years of education: Not on file  . Highest education level: Not on file  Occupational History  .  Occupation: Haematologist  Tobacco Use  . Smoking status: Never Smoker  . Smokeless tobacco: Never Used  Substance and Sexual Activity  . Alcohol use: No  . Drug use: No  . Sexual activity: Not on file  Other Topics Concern  . Not on file  Social History Narrative  . Not on file   Social Determinants of Health   Financial Resource Strain:   . Difficulty of  Paying Living Expenses:   Food Insecurity:   . Worried About Charity fundraiser in the Last Year:   . Arboriculturist in the Last Year:   Transportation Needs:   . Film/video editor (Medical):   Marland Kitchen Lack of Transportation (Non-Medical):   Physical Activity:   . Days of Exercise per Week:   . Minutes of Exercise per Session:   Stress:   . Feeling of Stress :   Social Connections:   . Frequency of Communication with Friends and Family:   . Frequency of Social Gatherings with Friends and Family:   . Attends Religious Services:   . Active Member of Clubs or Organizations:   . Attends Archivist Meetings:   Marland Kitchen Marital Status:   Intimate Partner Violence:   . Fear of Current or Ex-Partner:   . Emotionally Abused:   Marland Kitchen Physically Abused:   . Sexually Abused:     Past Surgical Hx:  Past Surgical History:  Procedure Laterality Date  . CHOLECYSTECTOMY OPEN  1974  . DILATION AND CURETTAGE OF UTERUS  1980s  . IR CV LINE INJECTION  01/26/2018  . IR FLUORO GUIDE PORT INSERTION RIGHT  12/29/2017  . IR REMOVAL TUN ACCESS W/ PORT W/O FL MOD SED  07/13/2019  . IR US GUIDE VASC ACCESS RIGHT  12/29/2017  . ROBOTIC ASSISTED TOTAL HYSTERECTOMY WITH BILATERAL SALPINGO OOPHERECTOMY Bilateral 12/01/2017   Procedure: XI ROBOTIC ASSISTED TOTAL HYSTERECTOMY WITH BILATERAL SALPINGO OOPHORECTOMY WITH SENTINAL LYMPH NODES;  Surgeon: Everitt Amber, MD;  Location: [facility];  Service: Gynecology;  Laterality: Bilateral;  . STRABISMUS SURGERY  age 46   unilateral    Past Medical Hx:  Past Medical History:  Diagnosis Date  . Anemia   . Dental bridge present    front upper tooth  . endometrial ca dx'd 10/2017  . Fibroid   . Hypercholesteremia   . Hypertension    followed by pcp  . Nocturia   . PONV (postoperative nausea and vomiting)     Past Gynecological History:  SVD x 2, fibroids No LMP recorded. Patient has had a hysterectomy.  Family Hx:  Family History  Problem Relation Age of Onset   . Colon cancer Other        paternal grandfather  . Cancer Father        prostate ca    Review of Systems:  Constitutional  Feels well,    ENT Normal appearing ears and nares bilaterally Skin/Breast  No rash, sores, jaundice, itching, dryness Cardiovascular  No chest pain, shortness of breath, or edema  Pulmonary  No cough or wheeze.  Gastro Intestinal  No nausea, vomitting, or diarrhoea. No bright red blood per rectum, no abdominal pain, change in bowel movement, or constipation.  Genito Urinary  No frequency, urgency, dysuria, no bleeding Musculo Skeletal  No myalgia, arthralgia, joint swelling or pain  Neurologic  No weakness, numbness, change in gait,  Psychology  No depression, anxiety, insomnia.   Vitals:  Blood pressure 108/61, pulse (!) 55, temperature 98.5  F (36.9 C), temperature source Temporal, resp. rate 17, height '5\' 4"'  (1.626 m), weight 228 lb 6.4 oz (103.6 kg), SpO2 100 %.  Physical Exam: WD in NAD Neck  Supple NROM, without any enlargements.  Lymph Node Survey No cervical supraclavicular or inguinal adenopathy Cardiovascular  Pulse normal rate, regularity and rhythm. S1 and S2 normal.  Lungs  Clear to auscultation bilateraly, without wheezes/crackles/rhonchi. Good air movement.  Skin  No rash/lesions/breakdown  Psychiatry  Alert and oriented to person, place, and time  Abdomen  Normoactive bowel sounds, abdomen soft, non-tender and obese without evidence of hernia. Incisions well healed. No masses. Back No CVA tenderness Genito Urinary  Vulva/vagina: Normal external female genitalia.  No lesions. No discharge or bleeding.  Bladder/urethra:  No lesions or masses, well supported bladder  Vagina: vaginal cuff smooth and regular with no masses Rectal  deferred Extremities  No bilateral cyanosis, clubbing or edema.   Thereasa Solo, MD  01/09/2020, 4:10 PM

## 2020-01-09 NOTE — Patient Instructions (Signed)
Please notify Dr Denman George at phone number [phone] if you notice vaginal bleeding, new pelvic or abdominal pains, bloating, feeling full easy, or a change in bladder or bowel function.   Please ask Dr Clabe Seal office contact Dr Serita Grit office (at [phone]) in June after you see him to request an appointment with her for December, 2021.

## 2020-02-20 ENCOUNTER — Other Ambulatory Visit: Payer: PRIVATE HEALTH INSURANCE

## 2020-02-21 ENCOUNTER — Other Ambulatory Visit: Payer: Self-pay | Admitting: Family Medicine

## 2020-02-21 DIAGNOSIS — Z1231 Encounter for screening mammogram for malignant neoplasm of breast: Secondary | ICD-10-CM

## 2020-02-29 ENCOUNTER — Other Ambulatory Visit: Payer: Self-pay

## 2020-02-29 ENCOUNTER — Ambulatory Visit
Admission: RE | Admit: 2020-02-29 | Discharge: 2020-02-29 | Disposition: A | Discharge status: Home / Self Care | Payer: PRIVATE HEALTH INSURANCE | Source: Ambulatory Visit | Attending: Family Medicine | Admitting: Family Medicine

## 2020-02-29 DIAGNOSIS — Z1231 Encounter for screening mammogram for malignant neoplasm of breast: Secondary | ICD-10-CM

## 2020-04-02 ENCOUNTER — Other Ambulatory Visit: Payer: PRIVATE HEALTH INSURANCE

## 2020-04-13 NOTE — Progress Notes (Signed)
Radiation Oncology         [phone] ________________________________  Name: Connie Simmons MRN: [id number]  Date: 04/14/2020  DOB: 1946-07-14  Follow-Up Visit Note  CC: Kelton Pillar, MD  Heath Lark, MD    ICD-10-CM   1. 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  02/09/2018 - 03/08/2018: Vaginal Cuff (HDR, Ir-192) / 30 Gy in 5 fractions  Narrative:  The patient returns today for routine follow-up. Since her last visit, she underwent a CT of abdomen and pelvis on 10/23/2019 that did not show any evidence of recurrent or metastatic disease. The previously described small post-operative fluid collection about the left pelvic sidewall had resolved.  She was last seen by Dr. Denman George on 01/09/2020, during which time she was noted to have a complete clinical response.  Of note, she underwent a bilateral screening mammogram on 02/29/2020 that did now show any mammographic evidence of malignancy.  On review of systems, she reports no complaints. She denies vaginal bleeding/discharge, pelvic pain, and urinary symptoms.  ALLERGIES:  is allergic to fish oil, simvastatin, codeine, and sulfa antibiotics.  Meds: Current Outpatient Medications  Medication Sig Dispense Refill  . benazepril-hydrochlorthiazide (LOTENSIN HCT) 20-12.5 MG tablet Take 1 tablet by mouth every morning.  1  . calcium carbonate (TUMS - DOSED IN MG ELEMENTAL CALCIUM) 500 MG chewable tablet Chew 2 tablets by mouth daily.    . fluticasone (FLONASE) 50 MCG/ACT nasal spray Place 1 spray into both nostrils daily as needed for allergies or rhinitis.    Marland Kitchen ibuprofen (ADVIL,MOTRIN) 200 MG tablet Take 400-600 mg by mouth 2 (two) times daily as needed for moderate pain.     . magnesium oxide (MAG-OX) 400 MG tablet Take 250  mg by mouth daily. Started taking for leg cramps    . Multiple Vitamin (MULTIVITAMIN WITH MINERALS) TABS tablet Take 1 tablet by mouth daily.    . Ascorbic Acid (VITAMIN C) 100 MG tablet Take 100 mg by mouth daily. (Patient not taking: Reported on 04/14/2020)     No current facility-administered medications for this encounter.    Physical Findings: The patient is in no acute distress. Patient is alert and oriented.  height is 5\' 4"  (1.626 m) and weight is 225 lb 6 oz (102.2 kg). Her temporal temperature is 97.8 F (36.6 C). Her blood pressure is 134/55 (abnormal) and her pulse is 55 (abnormal). Her respiration is 18 and oxygen saturation is 100%.   Lungs are clear to auscultation bilaterally. Heart has regular rate and rhythm. No palpable cervical, supraclavicular, or axillary adenopathy. Abdomen soft, non-tender, normal bowel sounds.  On pelvic examination the external genitalia were unremarkable. A speculum exam was performed. There are no mucosal lesions noted in the vaginal vault. On bimanual examination there were no pelvic masses appreciated. No adhesions appreciated or agglutination.  Vaginal cuff intact  Lab Findings: Lab Results  Component Value Date   WBC 7.9 07/13/2019   HGB 11.3 (L) 07/13/2019   HCT 36.9 07/13/2019   MCV 94.4 07/13/2019   PLT 237 07/13/2019    Radiographic Findings: No results found.  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   No evidence of recurrence on exam. Clinically, she seems to be doing  very well at this time.   Plan: The patient will follow up with Dr. Denman George in 6 months and with radiation oncology in 1 year.  She will undergo a CT scan in January 2022 for follow-up of her neuroendocrine tumor.  Total time spent in this encounter was 25 minutes which included reviewing the patient's most recent interval history, CT scan, mammogram, physical  examination, and documentation.  ____________________________________  Blair Promise, PhD, MD  This document serves as a record of services personally performed by Gery Pray, MD. It was created on his behalf by Clerance Lav, a trained medical scribe. The creation of this record is based on the scribe's personal observations and the provider's statements to them. This document has been checked and approved by the attending provider.

## 2020-04-14 ENCOUNTER — Other Ambulatory Visit: Payer: Self-pay

## 2020-04-14 ENCOUNTER — Encounter: Payer: Self-pay | Admitting: Radiation Oncology

## 2020-04-14 ENCOUNTER — Ambulatory Visit
Admission: RE | Admit: 2020-04-14 | Discharge: 2020-04-14 | Disposition: A | Discharge status: Home / Self Care | Payer: PRIVATE HEALTH INSURANCE | Source: Ambulatory Visit | Attending: Radiation Oncology | Admitting: Radiation Oncology

## 2020-04-14 DIAGNOSIS — Z923 Personal history of irradiation: Secondary | ICD-10-CM | POA: Diagnosis not present

## 2020-04-14 DIAGNOSIS — Z8542 Personal history of malignant neoplasm of other parts of uterus: Secondary | ICD-10-CM | POA: Diagnosis not present

## 2020-04-14 DIAGNOSIS — C541 Malignant neoplasm of endometrium: Secondary | ICD-10-CM

## 2020-04-14 NOTE — Progress Notes (Signed)
Patient here for a f/u with Dr. Sondra Come. Patient denies any vaginal bleeding, pain, discharge or problems with her bladder or bowels.

## 2020-05-01 ENCOUNTER — Other Ambulatory Visit: Payer: PRIVATE HEALTH INSURANCE

## 2020-05-13 IMAGING — US US THYROID
1 series · 13 of 25 positions shown · non-contrast
Comparison: None.

CLINICAL DATA: Incidental on CT.  Multinodular goiter.

EXAM:
THYROID ULTRASOUND
TECHNIQUE: Ultrasound examination of the thyroid gland and adjacent soft
tissues was performed.

[Series 1: us thyroid · 0.04mm/px · 13 of 72 slices shown]
[im 1/72]
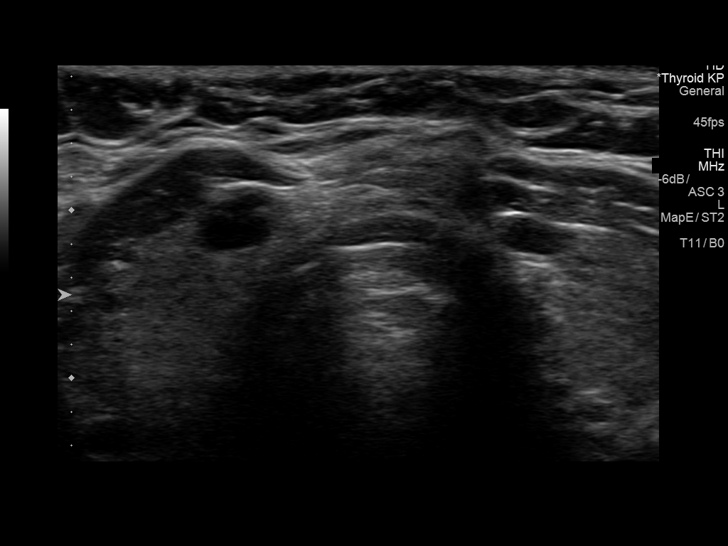
[im 6/72]
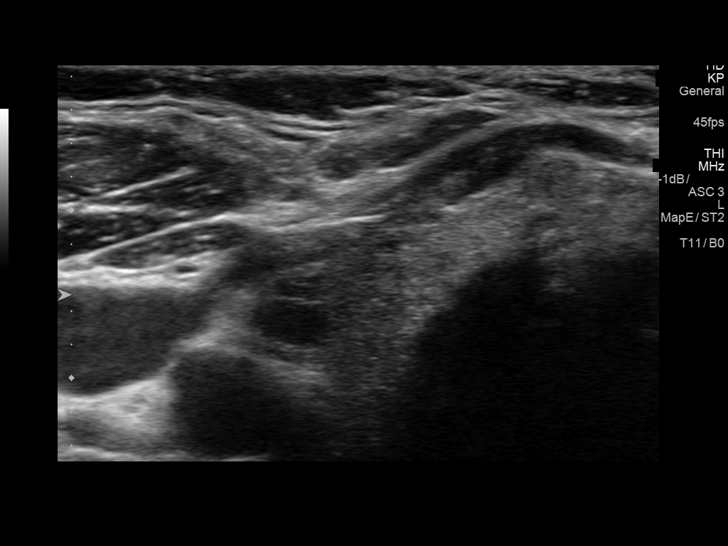
[im 12/72]
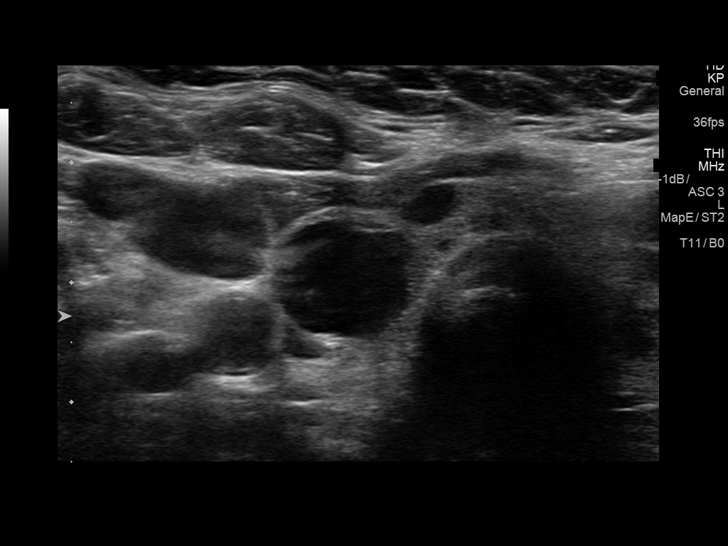
[im 18/72]
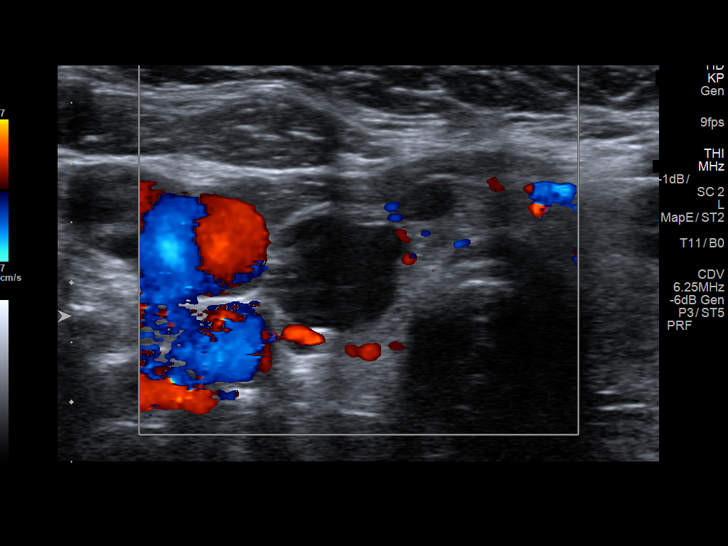
[im 24/72]
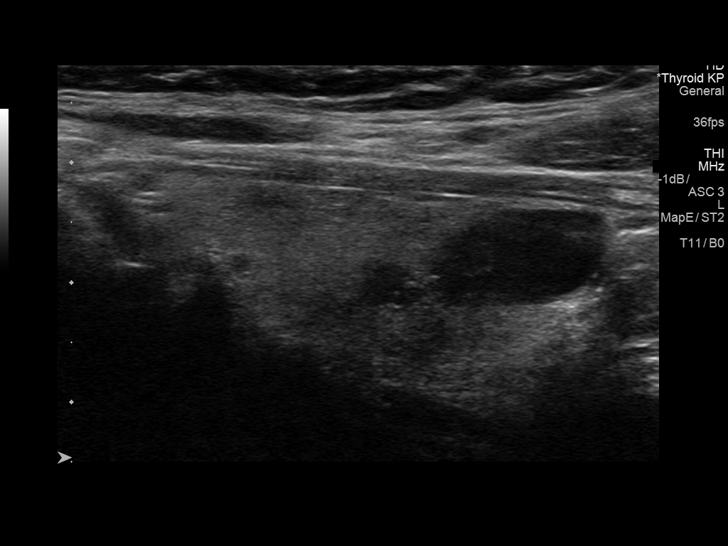
[im 30/72]
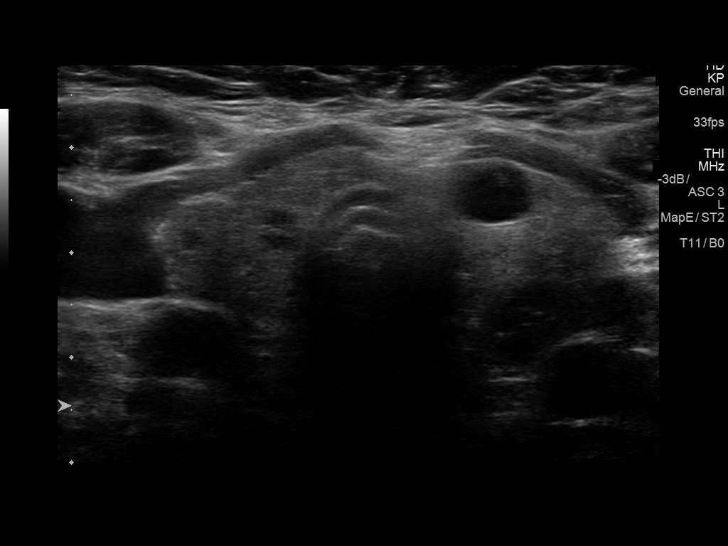
[im 36/72]
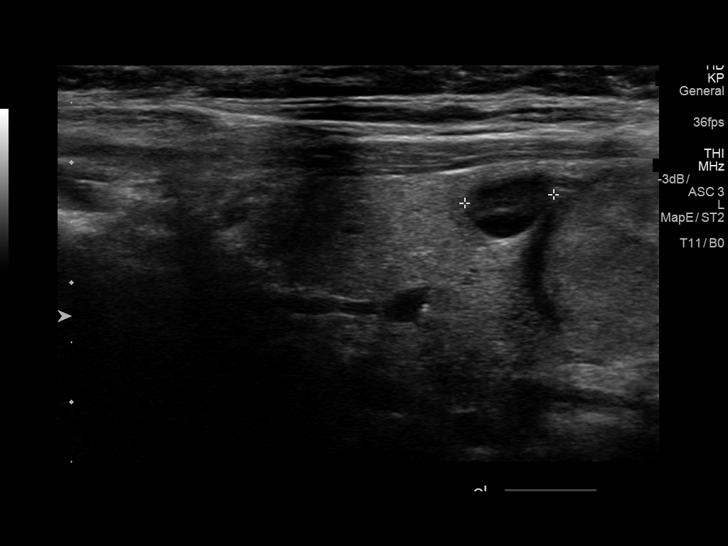
[im 42/72]
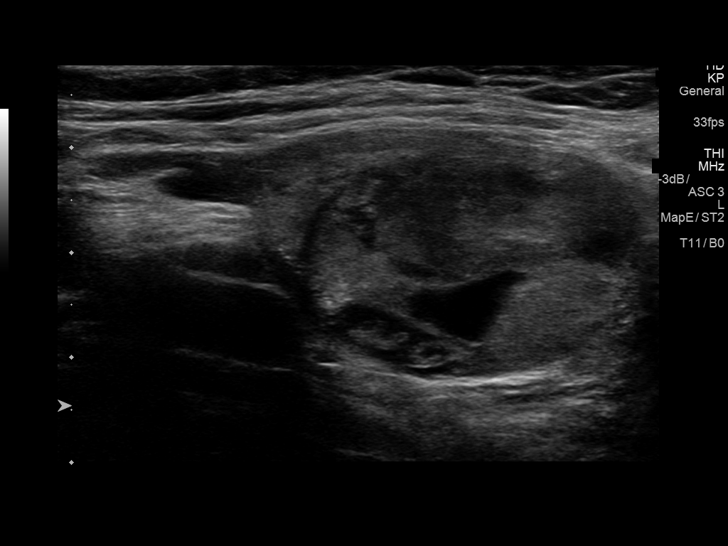
[im 48/72]
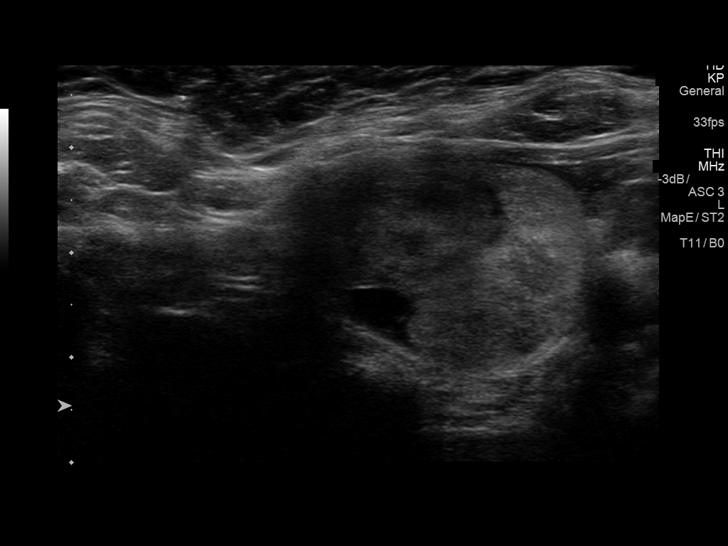
[im 54/72]
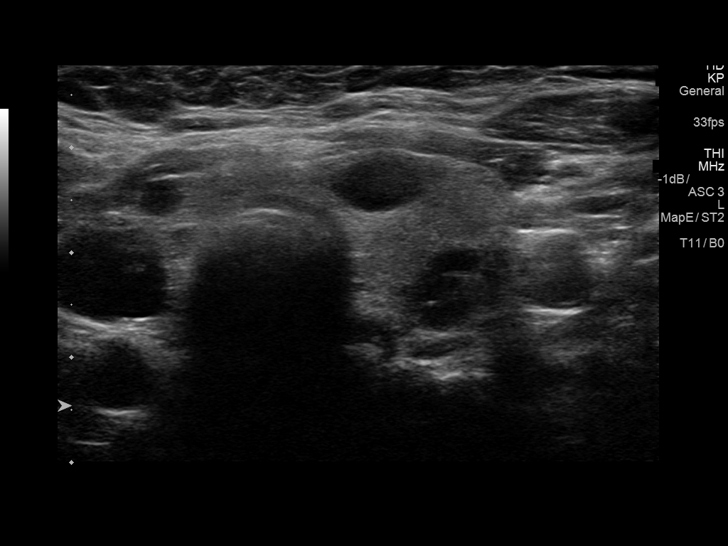
[im 60/72]
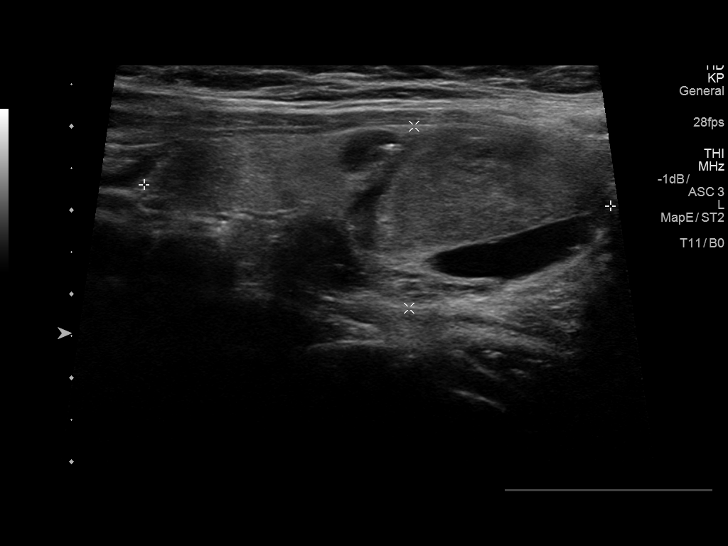
[im 66/72]
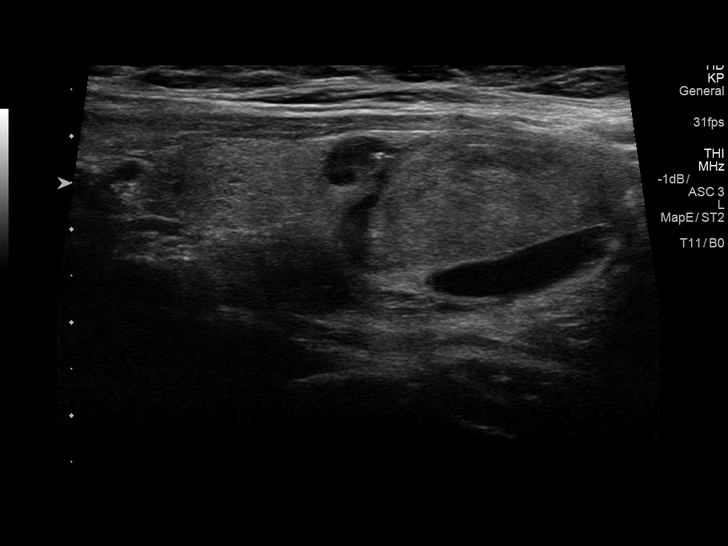
[im 72/72]
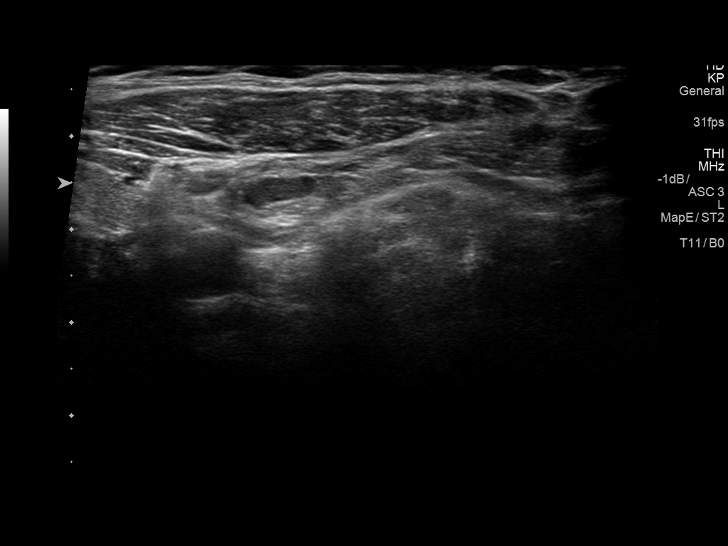

[13 of 25 positions shown; findings below may reference images not displayed]

FINDINGS: Parenchymal Echotexture: Mildly heterogenous

Isthmus: 0.5 cm

Right lobe: 3.9 x 1.6 x 1.5 cm

Left lobe: 5.6 x 2.2 x 2.2 cm

_________________________________________________________

Estimated total number of nodules >/= 1 cm: 3

Number of spongiform nodules >/=  2 cm not described below (TR1): 0

Number of mixed cystic and solid nodules >/= 1.5 cm not described
below (TR2): 0

_________________________________________________________

Nodule # 1:

Location: Right; Inferior

Maximum size: 1.5 cm; Other 2 dimensions: 1.1 x 1.0 cm

Composition: cystic/almost completely cystic (0)

Echogenicity: isoechoic (1)

Shape: not taller-than-wide (0)

Margins: smooth (0)

Echogenic foci: none (0)

ACR TI-RADS total points: 1.

ACR TI-RADS risk category: TR1 (0-1 points).

ACR TI-RADS recommendations:

This nodule does NOT meet TI-RADS criteria for biopsy or dedicated
follow-up.

_________________________________________________________

Nodule # 4:

Location: Left; Inferior

Maximum size: 3.2 cm; Other 2 dimensions: 2.9 x 2.3 cm

Composition: mixed cystic and solid (1)

Echogenicity: hypoechoic (2)

Shape: not taller-than-wide (0)

Margins: smooth (0)

Echogenic foci: none (0)

ACR TI-RADS total points: 3.

ACR TI-RADS risk category: TR3 (3 points).

ACR TI-RADS recommendations:

**Given size (>/= 2.5 cm) and appearance, fine needle aspiration of
this mildly suspicious nodule should be considered based on TI-RADS
criteria.

_________________________________________________________

Left mid nodule complex cyst measures 1.1 cm and does not meet
criteria for biopsy nor follow-up.

Left upper nodule measures 0.9 cm and does not meet criteria for
biopsy nor follow-up.
IMPRESSION: Left nodule 4 meets criteria for fine needle aspiration biopsy.

Other nodules do not meet criteria for biopsy nor follow-up.

The above is in keeping with the ACR TI-RADS recommendations - [HOSPITAL] 0292;[DATE].

## 2020-05-30 ENCOUNTER — Telehealth: Payer: Self-pay | Admitting: *Deleted

## 2020-05-30 NOTE — Telephone Encounter (Signed)
Called patient to inform of fu with Dr. Denman George on 10-09-20 - arrival time- 12:30 pm, spoke with patient and she is aware of this appt.

## 2020-05-30 NOTE — Telephone Encounter (Signed)
Shirley in rad onc called and scheduled the patient for a follow up appt in Dec. She will contact the patient with the date/time

## 2020-07-15 ENCOUNTER — Other Ambulatory Visit: Payer: PRIVATE HEALTH INSURANCE

## 2020-07-31 ENCOUNTER — Other Ambulatory Visit: Payer: Self-pay | Admitting: Internal Medicine

## 2020-07-31 DIAGNOSIS — E042 Nontoxic multinodular goiter: Secondary | ICD-10-CM

## 2020-08-11 ENCOUNTER — Other Ambulatory Visit: Payer: PRIVATE HEALTH INSURANCE

## 2020-08-27 ENCOUNTER — Other Ambulatory Visit: Payer: PRIVATE HEALTH INSURANCE

## 2020-09-19 ENCOUNTER — Ambulatory Visit
Admission: RE | Admit: 2020-09-19 | Discharge: 2020-09-19 | Disposition: A | Discharge status: Home / Self Care | Payer: PRIVATE HEALTH INSURANCE | Source: Ambulatory Visit | Attending: Internal Medicine | Admitting: Internal Medicine

## 2020-09-19 DIAGNOSIS — E042 Nontoxic multinodular goiter: Secondary | ICD-10-CM

## 2020-10-07 ENCOUNTER — Encounter: Payer: Self-pay | Admitting: Gynecologic Oncology

## 2020-10-09 ENCOUNTER — Encounter: Payer: Self-pay | Admitting: Gynecologic Oncology

## 2020-10-09 ENCOUNTER — Inpatient Hospital Stay: Payer: PRIVATE HEALTH INSURANCE | Attending: Gynecologic Oncology | Admitting: Gynecologic Oncology

## 2020-10-09 ENCOUNTER — Other Ambulatory Visit: Payer: Self-pay

## 2020-10-09 VITALS — BP 137/63 | HR 58 | Temp 97.0°F | Resp 16 | Ht 64.0 in | Wt 212.9 lb

## 2020-10-09 DIAGNOSIS — Z9221 Personal history of antineoplastic chemotherapy: Secondary | ICD-10-CM | POA: Insufficient documentation

## 2020-10-09 DIAGNOSIS — C7A8 Other malignant neuroendocrine tumors: Secondary | ICD-10-CM | POA: Diagnosis not present

## 2020-10-09 DIAGNOSIS — C541 Malignant neoplasm of endometrium: Secondary | ICD-10-CM | POA: Insufficient documentation

## 2020-10-09 DIAGNOSIS — C7B8 Other secondary neuroendocrine tumors: Secondary | ICD-10-CM | POA: Diagnosis not present

## 2020-10-09 DIAGNOSIS — Z923 Personal history of irradiation: Secondary | ICD-10-CM | POA: Diagnosis not present

## 2020-10-09 NOTE — Progress Notes (Signed)
Follow-up Note: Gyn-Onc  Consult was initially requested by Dr. Nelda Marseille for the evaluation of Connie Simmons 74 y.o. female  CC:  Chief Complaint  Patient presents with  . Endometrial cancer [facility])    Assessment/Plan:  Ms. Connie Simmons  is a 74 y.o.  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 S/p staging surgery on 12/01/17. MSI high endometrial tumor. Adjuvant carboplatin and paclitaxel x 6 and vaginal brachytherapy completed on 05/08/18.  Complete clinical response on imaging.  Annual CT imaging to follow-up neuroendocrine tumor -recommend CT in January, 2022 unless new symptom develop.  She will see Dr Sondra Come in 6 months and me back in 12 months until July, 2024 if no recurrence.   HPI: Connie Simmons is a 74 year old P2 who is seen in consultation at the request of Dr Nelda Marseille for serous endometrial cancer in the setting of obesity (BMI 39kg/m2).  The patient experienced vaginal bleeding for approximately 6 months though initially it was only light spotting. In January, 2019 it became heavier. She was seen at [facility] on 11/04/17 and prescribed Megace. She was then seen by Dr Nelda Marseille and an endometrial biopsy was performed on 11/10/17 which showed high grade serous endometrial cancer. TVUS showed a uterus measuring 6.3x11.2x7cm. The endometrium was 4cm thick.   The patient has a family history of a grandfather with colon cancer. She has had 2 prior SVD's and an open cholecystectomy.   On 11/23/17 preoperative CT abdo/pelvis showed signs of metastatic peritoneal disease with grossly thickened and heterogeneous endometrium consistent with known endometrial carcinoma. No CT findings to suggest serosal or extra uterine direct extension. No pelvic adenopathy. There are worrisome small peritoneal nodules and a small mesenteric nodal mass in the root of the small bowel mesentery suspicious for  metastatic disease. No metastatic pulmonary nodules at the lung bases or evidence of solid abdominal organ metastatic disease. On 12/01/17 she underwent robot assisted total hysterectomy, BSO, SLN biopsy and debulking of larger peritoneal implants. Final pathology revealed a 3.5cm high grade serous endometrial cancer with outer half myometrial invasion (1.2 of 2cm), negative LVSI and negative lymph nodes. The tumor was MSI high. There was an incidentally found neuroendocrine tumor on the left ovary with the following description:  The left ovary shows a 1 cm nodule of neoplasm characterized by sheets of cells with uniform, round to oval nuclei with rare mitotic figures. Iimmunohistochemistry is performed and the left ovarian tumor is positive with cytokeratin AE1/AE3, CD56, chromogranin, synaptophysin and CDX-2. The tumor is negative with inhibin, WT-1, calretinin, CD99, cytokerain 20, cytokeratin 7, estrogen receptor, progesterone receptor, vimentin, and p53. The morphology and immunophenotype are consistent with a low to intermediate grade neuroendocrine tumor and CDX-2 positivity suggests possible gastrointestinal origin. There is a similar 0.6 cm nodule in the paraovarian connective tissue and the peritoneal nodule (part 2) has identical features. The peritoneal nodules that were resected were also consistent with neuroendocrine tumor (not endometrial serous cancer).  Her case was reviewed at multidisciplinary tumor board and she was determined to have a primary ovarian neuroendocrine tumor and separate stage IB high grade serous endometrial cancer. She was recommended to undergo repeat staging imaging, GI evaluation, and then adjuvant therapy for the endometrial cancer.   Follow-up post-operative imaging of the chest abdomen and pelvis on 12/21/17 showed a 2.4cm left thyroid lobe nodule. A few tiny scattered bilateral upper lobe pulmonary nodules are  seen measuring less than 43m, which are indeterminate.  There was no residual or metastatic carcinoma seen within the abdomen or pelvis.  Given the high risk uterine pathology she was recommended to receive 6 cycles of adjuvant carb/taxol chemotherapy and vaginal brachytherapy.  She completed 6 cycles of adjuvant carboplatin and paclitaxel chemotherapy on 05/08/18. Vaginal brachytherapy (30Gy in 5 fractions) was completed on 03/08/18.  Post treatment imaging with CT abd/pelvis on 10/23/17 showed no evidence of persistent/recurrent disease. She had diverticulosis seen.  CT abd/pelvis on 10/23/19 showed status post hysterectomy and bilateral salpingo-oophorectomy. No evidence for recurrent or metastatic disease in the abdomen or pelvis. A previously described small postoperative fluid collection about the left pelvic sidewall has resolved. Sigmoid diverticulosis without evidence for diverticulitis.  Interval Hx: She has been doing well with no symptoms of recurrence. She has some mildly worse numbness in her feet but no pain.   Current Meds:   Outpatient Encounter Medications as of 10/09/2020  Medication Sig  . acetaminophen (TYLENOL) 650 MG CR tablet Take 650 mg by mouth every 8 (eight) hours as needed for pain.  . benazepril-hydrochlorthiazide (LOTENSIN HCT) 20-12.5 MG tablet Take 1 tablet by mouth every morning.  . calcium carbonate (TUMS - DOSED IN MG ELEMENTAL CALCIUM) 500 MG chewable tablet Chew 2 tablets by mouth daily.  . fluticasone (FLONASE) 50 MCG/ACT nasal spray Place 1 spray into both nostrils daily as needed for allergies or rhinitis.  .Marland Kitchenibuprofen (ADVIL,MOTRIN) 200 MG tablet Take 400-600 mg by mouth 2 (two) times daily as needed for moderate pain.   . Multiple Vitamin (MULTIVITAMIN WITH MINERALS) TABS tablet Take 1 tablet by mouth daily.  . magnesium oxide (MAG-OX) 400 MG tablet Take 250 mg by mouth daily. Started taking for leg cramps (Patient not taking: Reported on 10/07/2020)  . [DISCONTINUED] Ascorbic Acid (VITAMIN C) 100 MG tablet  Take 100 mg by mouth daily. (Patient not taking: Reported on 04/14/2020)   No facility-administered encounter medications on file as of 10/09/2020.    Allergy:  Allergies  Allergen Reactions  . Fish Oil Nausea Only    Abdominal pain  . Simvastatin Other (See Comments)    Abdominal pain  . Codeine Itching  . Sulfa Antibiotics Itching and Rash    Social Hx:   Social History   Socioeconomic History  . Marital status: Divorced    Spouse name: Not on file  . Number of children: Not on file  . Years of education: Not on file  . Highest education level: Not on file  Occupational History  . Occupation: rHaematologist Tobacco Use  . Smoking status: Never Smoker  . Smokeless tobacco: Never Used  Vaping Use  . Vaping Use: Never used  Substance and Sexual Activity  . Alcohol use: No  . Drug use: No  . Sexual activity: Not on file  Other Topics Concern  . Not on file  Social History Narrative  . Not on file   Social Determinants of Health   Financial Resource Strain: Not on file  Food Insecurity: Not on file  Transportation Needs: Not on file  Physical Activity: Not on file  Stress: Not on file  Social Connections: Not on file  Intimate Partner Violence: Not on file    Past Surgical Hx:  Past Surgical History:  Procedure Laterality Date  . CHOLECYSTECTOMY OPEN  1974  . DILATION AND CURETTAGE OF UTERUS  1980s  . IR CV LINE INJECTION  01/26/2018  . IR FLUORO GUIDE PORT  INSERTION RIGHT  12/29/2017  . IR REMOVAL TUN ACCESS W/ PORT W/O FL MOD SED  07/13/2019  . IR US GUIDE VASC ACCESS RIGHT  12/29/2017  . ROBOTIC ASSISTED TOTAL HYSTERECTOMY WITH BILATERAL SALPINGO OOPHERECTOMY Bilateral 12/01/2017   Procedure: XI ROBOTIC ASSISTED TOTAL HYSTERECTOMY WITH BILATERAL SALPINGO OOPHORECTOMY WITH SENTINAL LYMPH NODES;  Surgeon: Everitt Amber, MD;  Location: [facility];  Service: Gynecology;  Laterality: Bilateral;  . STRABISMUS SURGERY  age 8   unilateral    Past Medical Hx:  Past  Medical History:  Diagnosis Date  . Anemia   . Dental bridge present    front upper tooth  . endometrial ca dx'd 10/2017  . Fibroid   . Hypercholesteremia   . Hypertension    followed by pcp  . Nocturia   . PONV (postoperative nausea and vomiting)   . Thyroid disease    benign goiter    Past Gynecological History:  SVD x 2, fibroids No LMP recorded. Patient has had a hysterectomy.  Family Hx:  Family History  Problem Relation Age of Onset  . Colon cancer Other        paternal grandfather  . Cancer Father        prostate ca  . Stroke Mother   . Hypertension Mother   . Thyroid disease Sister   . Heart Problems Sister     Review of Systems:  Constitutional  Feels well,    ENT Normal appearing ears and nares bilaterally Skin/Breast  No rash, sores, jaundice, itching, dryness Cardiovascular  No chest pain, shortness of breath, or edema  Pulmonary  No cough or wheeze.  Gastro Intestinal  No nausea, vomitting, or diarrhoea. No bright red blood per rectum, no abdominal pain, change in bowel movement, or constipation.  Genito Urinary  No frequency, urgency, dysuria, no bleeding Musculo Skeletal  No myalgia, arthralgia, joint swelling or pain  Neurologic  No weakness, numbness, change in gait,  Psychology  No depression, anxiety, insomnia.   Vitals:  Blood pressure 137/63, pulse (!) 58, temperature (!) 97 F (36.1 C), temperature source Tympanic, resp. rate 16, height '5\' 4"'  (1.626 m), weight 212 lb 14.4 oz (96.6 kg), SpO2 100 %.  Physical Exam: WD in NAD Neck  Supple NROM, without any enlargements.  Lymph Node Survey No cervical supraclavicular or inguinal adenopathy Cardiovascular  Pulse normal rate, regularity and rhythm. S1 and S2 normal.  Lungs  Clear to auscultation bilateraly, without wheezes/crackles/rhonchi. Good air movement.  Skin  No rash/lesions/breakdown  Psychiatry  Alert and oriented to person, place, and time  Abdomen  Normoactive bowel  sounds, abdomen soft, non-tender and obese without evidence of hernia. Incisions well healed. No masses. Back No CVA tenderness Genito Urinary  Vulva/vagina: Normal external female genitalia.  No lesions. No discharge or bleeding.  Bladder/urethra:  No lesions or masses, well supported bladder  Vagina: vaginal cuff smooth and regular with no masses Rectal  deferred Extremities  No bilateral cyanosis, clubbing or edema.   Thereasa Solo, MD  10/09/2020, 1:35 PM

## 2020-10-09 NOTE — Patient Instructions (Signed)
Dr Denman George is scheduling a CT scan in January to monitor your cancer. Please return to see Dr Sondra Come in June.  Please have Dr Clabe Seal office contact Dr Serita Grit office (at [phone]) in June after your appointment with him to request an appointment with Dr Denman George for December, 2022.

## 2020-11-19 ENCOUNTER — Other Ambulatory Visit: Payer: PRIVATE HEALTH INSURANCE

## 2020-11-21 ENCOUNTER — Ambulatory Visit (HOSPITAL_COMMUNITY): Payer: PRIVATE HEALTH INSURANCE

## 2020-12-16 ENCOUNTER — Inpatient Hospital Stay: Payer: PRIVATE HEALTH INSURANCE | Attending: Gynecologic Oncology

## 2020-12-16 ENCOUNTER — Other Ambulatory Visit: Payer: Self-pay

## 2020-12-16 DIAGNOSIS — C541 Malignant neoplasm of endometrium: Secondary | ICD-10-CM | POA: Insufficient documentation

## 2020-12-16 LAB — BASIC METABOLIC PANEL - CANCER CENTER ONLY
Anion gap: 11 (ref 5–15)
BUN: 25 mg/dL — ABNORMAL HIGH (ref 8–23)
CO2: 25 mmol/L (ref 22–32)
Calcium: 9.6 mg/dL (ref 8.9–10.3)
Chloride: 103 mmol/L (ref 98–111)
Creatinine: 1.05 mg/dL — ABNORMAL HIGH (ref 0.44–1.00)
GFR, Estimated: 56 mL/min — ABNORMAL LOW (ref >60)
Glucose, Bld: 89 mg/dL (ref 70–99)
Potassium: 4.1 mmol/L (ref 3.5–5.1)
Sodium: 139 mmol/L (ref 135–145)

## 2020-12-19 ENCOUNTER — Ambulatory Visit (HOSPITAL_COMMUNITY)
Admission: RE | Admit: 2020-12-19 | Discharge: 2020-12-19 | Disposition: A | Discharge status: Home / Self Care | Payer: No Typology Code available for payment source | Source: Ambulatory Visit | Attending: Gynecologic Oncology | Admitting: Gynecologic Oncology

## 2020-12-19 ENCOUNTER — Encounter (HOSPITAL_COMMUNITY): Payer: Self-pay

## 2020-12-19 ENCOUNTER — Other Ambulatory Visit: Payer: Self-pay

## 2020-12-19 DIAGNOSIS — C541 Malignant neoplasm of endometrium: Secondary | ICD-10-CM | POA: Insufficient documentation

## 2020-12-19 MED ORDER — IOHEXOL 300 MG/ML  SOLN
100.0000 mL | Freq: Once | INTRAMUSCULAR | Status: AC | PRN
  Administered 2020-12-19: 100 mL via INTRAVENOUS

## 2020-12-24 ENCOUNTER — Telehealth: Payer: Self-pay

## 2020-12-24 NOTE — Telephone Encounter (Signed)
TC to patient.  Stable CT results reported to patient.  No intervention at this time and follow up as planned.  Appointment with Dr. Sondra Come in June with return to Dr. Denman George in December.  Copy of CT emailed to patient.

## 2021-03-31 ENCOUNTER — Other Ambulatory Visit: Payer: Self-pay | Admitting: Family Medicine

## 2021-03-31 DIAGNOSIS — Z1231 Encounter for screening mammogram for malignant neoplasm of breast: Secondary | ICD-10-CM

## 2021-04-03 ENCOUNTER — Encounter: Payer: Self-pay | Admitting: Radiology

## 2021-04-04 ENCOUNTER — Ambulatory Visit
Admission: RE | Admit: 2021-04-04 | Discharge: 2021-04-04 | Disposition: A | Discharge status: Home / Self Care | Payer: PRIVATE HEALTH INSURANCE | Source: Ambulatory Visit | Attending: Family Medicine | Admitting: Family Medicine

## 2021-04-04 DIAGNOSIS — Z1231 Encounter for screening mammogram for malignant neoplasm of breast: Secondary | ICD-10-CM

## 2021-04-09 NOTE — Progress Notes (Signed)
Radiation Oncology         [phone] ________________________________  Name: Connie Simmons MRN: [id number]  Date: 04/13/2021  DOB: [birth year]  Follow-Up Visit Note  CC: Kelton Pillar, MD  Heath Lark, MD    ICD-10-CM   1. 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      Since then, the patient received an ultrasound (thyroid) on 09/19/20 which showed a possible slight interval enlargement of the previously biopsied mass in the left mid to inferior gland. No new or suspicious nodules were noted to be identified.      Follow-up CT of abdomen and pelvis taken on 12/19/20 showed no significant change in the size or number of the small scattered peritoneal nodules which were present but less conspicuous from prior imaging. The nodules were noted to be stable as well, with some being slightly decreased in size in comparison to prior exams dating back to 2019. Given the patient's history of prior pathologically positive mesenteric nodular implants, these nodules were noted to be most consistent with indolent disease involvement. Overall, no new enlarging metastatic disease was seen in the abdomen or pelvis. Of note: also seen was postsurgical change to the site of hysterectomy/ bilateral salpingo oophorectomy.   Most recently, the patient received a bilateral mammography on 04/04/21 which showed no evidence of malignancy.   She denies any pelvic pain vaginal bleeding or discharge.  She denies any abdominal bloating.  She continues to use her vaginal dilator occasionally.  Since she is  now 3 years out from her treatment I have discussed that she can discontinue this use                         Allergies:  is allergic to fish oil, simvastatin, codeine, and sulfa antibiotics.  Meds: Current Outpatient Medications  Medication Sig Dispense Refill   acetaminophen (TYLENOL) 650 MG CR tablet Take 650 mg by mouth every 8 (eight) hours as needed for pain.     benazepril-hydrochlorthiazide (LOTENSIN HCT) 20-12.5 MG tablet Take 1 tablet by mouth every morning.  1   calcium carbonate (TUMS - DOSED IN MG ELEMENTAL CALCIUM) 500 MG chewable tablet Chew 2 tablets by mouth daily.     fluticasone (FLONASE) 50 MCG/ACT nasal spray Place 1 spray into both nostrils daily as needed for allergies or rhinitis.     ibuprofen (ADVIL,MOTRIN) 200 MG tablet Take 400-600 mg by mouth 2 (two) times daily as needed for moderate pain.      magnesium oxide (MAG-OX) 400 MG tablet Take 250 mg by mouth daily. Started taking for leg cramps     Multiple Vitamin (MULTIVITAMIN WITH MINERALS) TABS tablet Take 1 tablet by mouth daily.     TART CHERRY PO Take 1 tablet by mouth daily.     No current facility-administered medications for this encounter.    Physical Findings: The patient is in no acute distress. Patient is alert and oriented.  height is 5\' 4"  (1.626 m) and weight is 218 lb 6.4 oz (99.1  kg). Her temperature is 97.8 F (36.6 C). Her blood pressure is 127/62 and her pulse is 63. Her respiration is 20 and oxygen saturation is 100%. .  No significant changes. Lungs are clear to auscultation bilaterally. Heart has regular rate and rhythm. No palpable cervical, supraclavicular, or axillary adenopathy. Abdomen soft, non-tender, normal bowel sounds.  On pelvic examination the external genitalia are unremarkable.  A speculum exam is performed.  There are no mucosal lesions noted in the vaginal vault.  On bimanual examination the vaginal cuff is intact.  No pelvic masses appreciated.   Lab Findings: Lab Results   Component Value Date   WBC 7.9 07/13/2019   HGB 11.3 (L) 07/13/2019   HCT 36.9 07/13/2019   MCV 94.4 07/13/2019   PLT 237 07/13/2019    Radiographic Findings: MM 3D SCREEN BREAST BILATERAL  Result Date: 04/06/2021 CLINICAL DATA:  Screening. EXAM: DIGITAL SCREENING BILATERAL MAMMOGRAM WITH TOMOSYNTHESIS AND CAD TECHNIQUE: Bilateral screening digital craniocaudal and mediolateral oblique mammograms were obtained. Bilateral screening digital breast tomosynthesis was performed. The images were evaluated with computer-aided detection. COMPARISON:  Previous exam(s). ACR Breast Density Category b: There are scattered areas of fibroglandular density. FINDINGS: There are no findings suspicious for malignancy. IMPRESSION: No mammographic evidence of malignancy. A result letter of this screening mammogram will be mailed directly to the patient. RECOMMENDATION: Screening mammogram in one year. (Code:SM-B-01Y) BI-RADS CATEGORY  1: Negative. Electronically Signed   By: Lovey Newcomer M.D.   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  Plan:  Patient will follow up with Dr. Denman George in 6 months.  She will follow-up in radiation oncology in 1 year.   20 minutes of total time was spent for this patient encounter, including preparation, face-to-face counseling with the patient and coordination of care, physical exam, and documentation of the encounter. ____________________________________  Blair Promise, PhD, MD   This document serves as a record of services personally performed by Gery Pray, MD. It was created on his behalf by Roney Mans, a trained medical scribe. The creation of this record is based on the scribe's personal observations and the provider's statements to them. This document has  been checked and approved by the attending provider.

## 2021-04-13 ENCOUNTER — Other Ambulatory Visit: Payer: Self-pay

## 2021-04-13 ENCOUNTER — Ambulatory Visit
Admission: RE | Admit: 2021-04-13 | Discharge: 2021-04-13 | Disposition: A | Discharge status: Home / Self Care | Payer: No Typology Code available for payment source | Source: Ambulatory Visit | Attending: Radiation Oncology | Admitting: Radiation Oncology

## 2021-04-13 ENCOUNTER — Encounter: Payer: Self-pay | Admitting: Hematology and Oncology

## 2021-04-13 ENCOUNTER — Encounter: Payer: Self-pay | Admitting: Radiation Oncology

## 2021-04-13 VITALS — BP 127/62 | HR 63 | Temp 97.8°F | Resp 20 | Ht 64.0 in | Wt 218.4 lb

## 2021-04-13 DIAGNOSIS — Z8542 Personal history of malignant neoplasm of other parts of uterus: Secondary | ICD-10-CM | POA: Diagnosis present

## 2021-04-13 DIAGNOSIS — Z923 Personal history of irradiation: Secondary | ICD-10-CM | POA: Insufficient documentation

## 2021-04-13 DIAGNOSIS — C541 Malignant neoplasm of endometrium: Secondary | ICD-10-CM

## 2021-04-13 NOTE — Progress Notes (Signed)
Connie Simmons is here today for follow up post radiation to the pelvic.  They completed their radiation on: 03/08/2018   Does the patient complain of any of the following:  Pain: denies Abdominal bloating: denies Diarrhea/Constipation: denies Nausea/Vomiting: denies Vaginal Discharge: denies Blood in Urine or Stool: denies Urinary Issues (dysuria/incomplete emptying/ incontinence/ increased frequency/urgency): nocturia x 1-2 Does patient report using vaginal dilator 2-3 times a week and/or sexually active 2-3 weeks: uses but not on a regular basis   Additional comments if applicable: none  Vitals:   04/13/21 1000  BP: 127/62  Pulse: 63  Resp: 20  Temp: 97.8 F (36.6 C)  SpO2: 100%  Weight: 218 lb 6.4 oz (99.1 kg)  Height: 5\' 4"  (1.626 m)

## 2021-05-19 ENCOUNTER — Other Ambulatory Visit: Payer: Self-pay | Admitting: Family Medicine

## 2021-05-19 DIAGNOSIS — E2839 Other primary ovarian failure: Secondary | ICD-10-CM

## 2021-08-11 ENCOUNTER — Other Ambulatory Visit: Payer: No Typology Code available for payment source

## 2021-10-20 ENCOUNTER — Telehealth: Payer: Self-pay | Admitting: *Deleted

## 2021-10-20 NOTE — Telephone Encounter (Signed)
Patient called and scheduled a follow up appt with Dr Berline Lopes for 1/16 at 3:30 pm

## 2021-10-30 ENCOUNTER — Encounter: Payer: Self-pay | Admitting: Gynecologic Oncology

## 2021-11-02 ENCOUNTER — Other Ambulatory Visit: Payer: Self-pay

## 2021-11-02 ENCOUNTER — Encounter: Payer: Self-pay | Admitting: Gynecologic Oncology

## 2021-11-02 ENCOUNTER — Inpatient Hospital Stay: Payer: No Typology Code available for payment source | Attending: Gynecologic Oncology | Admitting: Gynecologic Oncology

## 2021-11-02 VITALS — BP 130/56 | HR 61 | Temp 97.7°F | Resp 18 | Ht 63.78 in | Wt 219.0 lb

## 2021-11-02 DIAGNOSIS — Z79899 Other long term (current) drug therapy: Secondary | ICD-10-CM | POA: Diagnosis not present

## 2021-11-02 DIAGNOSIS — C7A8 Other malignant neuroendocrine tumors: Secondary | ICD-10-CM

## 2021-11-02 DIAGNOSIS — E7800 Pure hypercholesterolemia, unspecified: Secondary | ICD-10-CM | POA: Insufficient documentation

## 2021-11-02 DIAGNOSIS — C541 Malignant neoplasm of endometrium: Secondary | ICD-10-CM

## 2021-11-02 DIAGNOSIS — Z8543 Personal history of malignant neoplasm of ovary: Secondary | ICD-10-CM | POA: Insufficient documentation

## 2021-11-02 DIAGNOSIS — Z90710 Acquired absence of both cervix and uterus: Secondary | ICD-10-CM | POA: Diagnosis not present

## 2021-11-02 DIAGNOSIS — Z8542 Personal history of malignant neoplasm of other parts of uterus: Secondary | ICD-10-CM | POA: Diagnosis not present

## 2021-11-02 DIAGNOSIS — Z90722 Acquired absence of ovaries, bilateral: Secondary | ICD-10-CM | POA: Insufficient documentation

## 2021-11-02 DIAGNOSIS — E079 Disorder of thyroid, unspecified: Secondary | ICD-10-CM | POA: Diagnosis not present

## 2021-11-02 DIAGNOSIS — I10 Essential (primary) hypertension: Secondary | ICD-10-CM | POA: Insufficient documentation

## 2021-11-02 DIAGNOSIS — Z923 Personal history of irradiation: Secondary | ICD-10-CM | POA: Insufficient documentation

## 2021-11-02 DIAGNOSIS — Z9221 Personal history of antineoplastic chemotherapy: Secondary | ICD-10-CM | POA: Diagnosis not present

## 2021-11-02 NOTE — Progress Notes (Signed)
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 - SEE COMMENT. Microscopic Comment Sections are of a high grade carcinoma with mixed glandular, papillary and solid patterns. The histomorphology and immunostain pattern of ER negative, PR negative, p53 positive and p16 positive is compatible with a serous carcinoma. Internal departmental review obtained (Dr. Lyndon Code) with agreement. The results were called to Dr. Nelda Marseille on on 11/14/17. (MEG:kh 11/14/17)   11/23/2017 Imaging   1. Grossly thickened and heterogeneous endometrium consistent with known endometrial carcinoma. No CT findings to suggest serosal or extra uterine direct extension. No pelvic adenopathy.  2. There are worrisome small peritoneal nodules and a small mesenteric nodal mass in the root of the small bowel mesentery suspicious for metastatic disease. 3. No metastatic pulmonary nodules at the lung bases or evidence of solid abdominal organ metastatic disease.     12/01/2017 Pathology Results   1. Lymph node, sentinel, biopsy, right obturator - ONE BENIGN LYMPH NODE (0/1). 2. Peritoneum, biopsy, peritoneal nodule - NEUROENDOCRINE TUMOR. - SEE COMMENT. 3. Lymph node, sentinel, biopsy, left obturator - SEVEN BENIGN LYMPH NODES (0/7). 4. Uterus +/- tubes/ovaries, neoplastic ENDOMETRIUM. - HIGH GRADE SEROUS CARCINOMA, 3.7 CM. - CARCINOMA FOCALLY INVOLVES OUTER HALF OF MYOMETRIUM. - CERVIX, RIGHT OVARY AND BILATERAL FALLOPIAN TUBES FREE OF TUMOR. LEFT OVARY: - NEUROENDOCRINE TUMOR. - SEE COMMENT. Microscopic Comment 4.  ONCOLOGY TABLE-UTERUS, CARCINOMA OR CARCINOSARCOMA Specimen: Uterus with bilateral ovaries and fallopian tubes with right and left obturator sentinel lymph nodes and peritoneal nodule biopsy. Procedure: Hysterectomy with bilateral salpingo-oophorectomy with right and left obturator lymph node biopsies. Lymph node sampling performed: Yes. Specimen integrity: Intact with cervix and body separated. Maximum tumor size: 3.7 cm Histologic type: High grade serous carcinoma. Grade: III. Myometrial invasion: 1.2 cm where myometrium is 2 cm in thickness. Cervical stromal involvement: No. Extent of involvement of other organs: High grade serous carcinoma involving endometrium and myometrium. Lymph - vascular invasion: No  Peritoneal washings: Pending. Lymph nodes: Examined: 7 Sentinel 0 Non-sentinel 7 Total Lymph nodes with metastasis: 0 Isolated tumor cells (< 0.2 mm): 0 Micrometastasis: (> 0.2 mm and < 2.0 mm): 0 Macrometastasis: (> 2.0 mm): 0 Extracapsular extension: N/A Pelvic lymph nodes: 0 involved of 8 lymph nodes. Para-aortic lymph nodes: 0 involved of 0 lymph nodes. Other (specify involvement and site): N/A TNM code: pT1b, pN0 FIGO Stage (based on pathologic findings, needs clinical correlation): IB. COMMENT: The endometrial tumor is 3.7 cm in greatest dimension and is a high grade serous carcinoma which focally invades the outer half of the myometrium.  The left ovary shows a 1 cm nodule of neoplasm characterized by sheets of cells with uniform, round to oval nuclei with rare mitotic figures. Iimmunohistochemistry is performed and the left ovarian tumor is positive with cytokeratin AE1/AE3, CD56, chromogranin, synaptophysin and CDX-2. The tumor is negative with inhibin, WT-1, calretinin, CD99, cytokerain 20, cytokeratin 7, estrogen receptor, progesterone receptor, vimentin, and p53. The morphology and immunophenotype are consistent with a low to intermediate grade neuroendocrine tumor  and CDX-2 positivity suggests possible gastrointestinal origin. There is a similar 0.6 cm nodule in the paraovarian connective tissue and the peritoneal  nodule (part 2) has identical features.    12/01/2017 Surgery   Operation: Robotic-assisted laparoscopic total hysterectomy >250gm with bilateral salpingoophorectomy, SLN biopsy, peritoneal nodule resection   Surgeon: Quinn Axe    Operative Findings: 14cm uterus, bulky with fibroids, grossly normal tubes and ovaries. No ascites. Multiple peritoneal nodules covering surface of pelvic peritoneum including near round ligament inserion on the left, sigmoid colon mesentery. There were also peritoneal nodules on the right peritoneal gutter adjacent to cecum which were above the pelvic brim (abdominal).     12/21/2017 Imaging   Prior hysterectomy. No evidence of residual or metastatic carcinoma within the abdomen or pelvis.  Tiny indeterminate bilateral upper lobe pulmonary nodules, likely postinflammatory in etiology although metastatic disease cannot definitely be excluded. Recommend continued follow-up by chest CT in 6 months.  Dominant 2.4 cm left thyroid lobe nodule. Consider thyroid ultrasound for further evaluation.    12/29/2017 Procedure   Successful placement of a right internal jugular approach power injectable Port-A-Cath. The catheter is ready for immediate use.   01/02/2018 - 05/08/2018 Chemotherapy   The patient had carboplatin and Taxol x 6 cycles   01/26/2018 Procedure   Well-positioned and normally functioning right IJ port catheter without evidence of complication.   02/09/2018 - 03/08/2018 Radiation Therapy   Radiation treatment dates:   02/09/2018 - 03/08/2018 Site/dose:   The patient received 30 Gy in 5 fractions directed at the vaginal cuff. Iridium 192 was the high dose rate source.    06/22/2018 Imaging   1. No findings suspicious for metastatic disease in the chest. Scattered tiny pulmonary nodules in both lungs  are all stable since 12/21/2017 chest CT, most compatible with benign etiology. 2. Mild multinodular goiter with interval growth of dominant 2.8 cm inferior left thyroid lobe nodule. Thyroid ultrasound evaluation is indicated. This follows ACR consensus guidelines: Managing Incidental Thyroid Nodules Detected on Imaging: White Paper of the ACR Incidental Thyroid Findings Committee. J Am Coll Radiol 2015; 12:143-150.  Aortic Atherosclerosis (ICD10-I70.0).    Last CT imaging of the abdomen and pelvis on 12/19/2020: Postsurgical changes noted.  No significant change in size or number of small scattered peritoneal nodules, which were present but less conspicuous on multiple prior exams and are stable to slightly decreased in size in comparison to prior exams dating back to 2019.  No new or enlarging metastatic disease in the abdomen or pelvis.  Interval History: Patient saw Dr. Roselind Messier last in June.  She was doing well at that time and was NED.  She had a mammogram in June, this was a BI-RADS 1.  Patient reports overall doing well.  She denies any vaginal bleeding or discharge.  She reports regular bowel and bladder function.  She endorses a good appetite without any nausea or emesis.  Past Medical/Surgical History: Past Medical History:  Diagnosis Date   Anemia    Dental bridge present    front upper tooth   endometrial ca dx'd 10/2017   Fibroid    History of radiation therapy 03/08/2018   vaginal brachytherapy 02/09/2018-03/08/2018      Dr Antony Blackbird   Hypercholesteremia    Hypertension    followed by pcp   Nocturia    PONV (postoperative nausea and vomiting)    Thyroid disease    benign goiter    Past Surgical History:  Procedure Laterality Date   CHOLECYSTECTOMY OPEN  1974   DILATION AND CURETTAGE OF UTERUS  1980s   IR CV LINE INJECTION  01/26/2018  IR FLUORO GUIDE PORT INSERTION RIGHT  12/29/2017   IR REMOVAL TUN ACCESS W/ PORT W/O FL MOD SED  07/13/2019   IR US GUIDE VASC ACCESS  RIGHT  12/29/2017   ROBOTIC ASSISTED TOTAL HYSTERECTOMY WITH BILATERAL SALPINGO OOPHERECTOMY Bilateral 12/01/2017   Procedure: XI ROBOTIC ASSISTED TOTAL HYSTERECTOMY WITH BILATERAL SALPINGO OOPHORECTOMY WITH SENTINAL LYMPH NODES;  Surgeon: Everitt Amber, MD;  Location: [facility];  Service: Gynecology;  Laterality: Bilateral;   STRABISMUS SURGERY  age 89   unilateral    Family History  Problem Relation Age of Onset   Colon cancer Other        paternal grandfather   Cancer Father        prostate ca   Stroke Mother    Hypertension Mother    Thyroid disease Sister    Heart Problems Sister     Social History   Socioeconomic History   Marital status: Divorced    Spouse name: Not on file   Number of children: Not on file   Years of education: Not on file   Highest education level: Not on file  Occupational History   Occupation: Haematologist  Tobacco Use   Smoking status: Never   Smokeless tobacco: Never  Vaping Use   Vaping Use: Never used  Substance and Sexual Activity   Alcohol use: No   Drug use: No   Sexual activity: Not on file  Other Topics Concern   Not on file  Social History Narrative   Not on file   Social Determinants of Health   Financial Resource Strain: Not on file  Food Insecurity: Not on file  Transportation Needs: Not on file  Physical Activity: Not on file  Stress: Not on file  Social Connections: Not on file    Current Medications:  Current Outpatient Medications:    acetaminophen (TYLENOL) 650 MG CR tablet, Take 650 mg by mouth every 8 (eight) hours as needed for pain., Disp: , Rfl:    benazepril-hydrochlorthiazide (LOTENSIN HCT) 20-12.5 MG tablet, Take 1 tablet by mouth every morning., Disp: , Rfl: 1   calcium carbonate (TUMS - DOSED IN MG ELEMENTAL CALCIUM) 500 MG chewable tablet, Chew 2 tablets by mouth daily., Disp: , Rfl:    fluticasone (FLONASE) 50 MCG/ACT nasal spray, Place 1 spray into both nostrils daily as needed for allergies or  rhinitis., Disp: , Rfl:    ibuprofen (ADVIL,MOTRIN) 200 MG tablet, Take 400-600 mg by mouth 2 (two) times daily as needed for moderate pain. , Disp: , Rfl:    magnesium oxide (MAG-OX) 400 MG tablet, Take 250 mg by mouth daily. Started taking for leg cramps, Disp: , Rfl:    Melatonin 10 MG CAPS, Take by mouth., Disp: , Rfl:    Multiple Vitamin (MULTIVITAMIN WITH MINERALS) TABS tablet, Take 1 tablet by mouth daily., Disp: , Rfl:    TART CHERRY PO, Take 1 tablet by mouth daily., Disp: , Rfl:   Review of Systems: Denies appetite changes, fevers, chills, fatigue, unexplained weight changes. Denies hearing loss, neck lumps or masses, mouth sores, ringing in ears or voice changes. Denies cough or wheezing.  Denies shortness of breath. Denies chest pain or palpitations. Denies leg swelling. Denies abdominal distention, pain, blood in stools, constipation, diarrhea, nausea, vomiting, or early satiety. Denies pain with intercourse, dysuria, frequency, hematuria or incontinence. Denies hot flashes, pelvic pain, vaginal bleeding or vaginal discharge.   Denies joint pain, back pain or muscle pain/cramps. Denies itching, rash, or wounds. Denies  dizziness, headaches, numbness or seizures. Denies swollen lymph nodes or glands, denies easy bruising or bleeding. Denies anxiety, depression, confusion, or decreased concentration.  Physical Exam: BP (!) 130/56 (BP Location: Left Arm, Patient Position: Sitting)    Pulse 61    Temp 97.7 F (36.5 C) (Oral)    Resp 18    Ht 5' 3.78" (1.62 m)    Wt 219 lb (99.3 kg)    SpO2 99%    BMI 37.85 kg/m  General: Alert, oriented, no acute distress. HEENT: Normocephalic, atraumatic, sclera anicteric. Chest: Clear to auscultation bilaterally.  No wheezes or rhonchi. Cardiovascular: Regular rate and rhythm, no murmurs. Abdomen: Obese, soft, nontender.  Normoactive bowel sounds.  No masses or hepatosplenomegaly appreciated.  Well-healed incisions. Extremities: Grossly normal  range of motion.  Warm, well perfused.  No edema bilaterally. Skin: No rashes or lesions noted. Lymphatics: No cervical, supraclavicular, or inguinal adenopathy. GU: Normal appearing external genitalia without erythema, excoriation, or lesions.  Speculum exam reveals mildly atrophic vaginal mucosa, somewhat foreshortened, no lesions or masses.  Bimanual exam reveals cuff intact, no nodularity or masses.  Rectovaginal exam confirms exam findings.  Laboratory & Radiologic Studies: None new  Assessment & Plan: Connie Simmons is a 76 y.o. 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 S/p staging surgery on 12/01/17. MSI high endometrial tumor. Adjuvant carboplatin and paclitaxel x 6 and vaginal brachytherapy completed on 05/08/18. Complete clinical response on imaging.   We will plan to continue annual CT imaging to follow-up neuroendocrine tumor.  CT ordered today to be done in March.  Otherwise would perform imaging if indicated by symptoms.    She will see Dr Sondra Come in 6 months and me back in 12 months.  We will continue 70-monthsurveillance until July 2024.  If no recurrence at that time, can transition to yearly visits that could be with her gynecologist.   32 minutes of total time was spent for this patient encounter, including preparation, face-to-face counseling with the patient and coordination of care, and documentation of the encounter.  KJeral Pinch MD  Division of Gynecologic Oncology  Department of Obstetrics and Gynecology  [facility]

## 2021-11-02 NOTE — Patient Instructions (Signed)
It was a pleasure meeting you today.  I do not see or feel any evidence of cancer recurrence on your exam.  I have a CT ordered for March.  I will release the results to you in MyChart unless we need to talk by phone.  If that is the case, I will call you to review the results.  We will continue with visits every 6 months.  He will see radiation oncology over the summer.  Please call back sometime after June to get a visit scheduled with me in late December.  As always, if you develop any new and concerning symptoms, such as vaginal bleeding, pelvic pain, change to bowel function, or unintentional weight loss, please call to see me sooner.

## 2021-12-07 ENCOUNTER — Other Ambulatory Visit: Payer: No Typology Code available for payment source

## 2021-12-08 ENCOUNTER — Other Ambulatory Visit: Payer: No Typology Code available for payment source

## 2021-12-16 ENCOUNTER — Inpatient Hospital Stay: Payer: No Typology Code available for payment source | Attending: Gynecologic Oncology

## 2021-12-16 ENCOUNTER — Other Ambulatory Visit: Payer: Self-pay

## 2021-12-16 DIAGNOSIS — Z8543 Personal history of malignant neoplasm of ovary: Secondary | ICD-10-CM | POA: Insufficient documentation

## 2021-12-16 DIAGNOSIS — C541 Malignant neoplasm of endometrium: Secondary | ICD-10-CM

## 2021-12-16 DIAGNOSIS — Z8542 Personal history of malignant neoplasm of other parts of uterus: Secondary | ICD-10-CM | POA: Insufficient documentation

## 2021-12-16 DIAGNOSIS — C7A8 Other malignant neuroendocrine tumors: Secondary | ICD-10-CM

## 2021-12-16 LAB — BASIC METABOLIC PANEL - CANCER CENTER ONLY
Anion gap: 7 (ref 5–15)
BUN: 23 mg/dL (ref 8–23)
CO2: 28 mmol/L (ref 22–32)
Calcium: 9.5 mg/dL (ref 8.9–10.3)
Chloride: 103 mmol/L (ref 98–111)
Creatinine: 1.02 mg/dL — ABNORMAL HIGH (ref 0.44–1.00)
GFR, Estimated: 58 mL/min — ABNORMAL LOW (ref >60)
Glucose, Bld: 89 mg/dL (ref 70–99)
Potassium: 4 mmol/L (ref 3.5–5.1)
Sodium: 138 mmol/L (ref 135–145)

## 2021-12-18 ENCOUNTER — Other Ambulatory Visit: Payer: No Typology Code available for payment source

## 2021-12-18 ENCOUNTER — Encounter (HOSPITAL_COMMUNITY): Payer: Self-pay

## 2021-12-18 ENCOUNTER — Ambulatory Visit (HOSPITAL_COMMUNITY)
Admission: RE | Admit: 2021-12-18 | Discharge: 2021-12-18 | Disposition: A | Discharge status: Home / Self Care | Payer: No Typology Code available for payment source | Source: Ambulatory Visit | Attending: Gynecologic Oncology | Admitting: Gynecologic Oncology

## 2021-12-18 ENCOUNTER — Other Ambulatory Visit: Payer: Self-pay

## 2021-12-18 DIAGNOSIS — C7A8 Other malignant neuroendocrine tumors: Secondary | ICD-10-CM | POA: Insufficient documentation

## 2021-12-18 MED ORDER — IOHEXOL 300 MG/ML  SOLN
100.0000 mL | Freq: Once | INTRAMUSCULAR | Status: AC | PRN
  Administered 2021-12-18: 100 mL via INTRAVENOUS

## 2021-12-18 MED ORDER — SODIUM CHLORIDE (PF) 0.9 % IJ SOLN
INTRAMUSCULAR | Status: AC
  Filled 2021-12-18: qty 50

## 2021-12-21 ENCOUNTER — Ambulatory Visit (HOSPITAL_COMMUNITY): Payer: No Typology Code available for payment source

## 2022-03-08 ENCOUNTER — Telehealth: Payer: Self-pay | Admitting: *Deleted

## 2022-03-08 NOTE — Telephone Encounter (Signed)
CALLED PATIENT TO INFORM THAT FU ON 04-15-22 NEEDS TO BE CHANGED DUE TO DR. KINARD BEING OFF, RESCHEDULED FOR 04-22-22 @ 10 AM, LVM FOR A RETURN CALL

## 2022-03-16 ENCOUNTER — Ambulatory Visit
Admission: RE | Admit: 2022-03-16 | Discharge: 2022-03-16 | Disposition: A | Discharge status: Home / Self Care | Payer: No Typology Code available for payment source | Source: Ambulatory Visit | Attending: Family Medicine | Admitting: Family Medicine

## 2022-03-16 DIAGNOSIS — E2839 Other primary ovarian failure: Secondary | ICD-10-CM

## 2022-04-15 ENCOUNTER — Other Ambulatory Visit: Payer: Self-pay | Admitting: Family Medicine

## 2022-04-15 ENCOUNTER — Ambulatory Visit: Payer: Self-pay | Admitting: Radiation Oncology

## 2022-04-15 DIAGNOSIS — Z1231 Encounter for screening mammogram for malignant neoplasm of breast: Secondary | ICD-10-CM

## 2022-04-21 NOTE — Progress Notes (Signed)
Radiation Oncology         ([phone]) [phone] ________________________________  Name: Connie Simmons MRN: [id number]  Date: 04/22/2022  DOB: 27-Dec-1945  Follow-Up Visit Note  CC: Kelton Pillar, MD  Heath Lark, MD  No diagnosis found.  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 Since her last visit, the patient followed up with Dr. Berline Lopes on 11/02/21. During which time, the patient denied any symptoms concerning for disease recurrence and was noted as NED on examination.                 Pertinent imaging performed in the interval includes the following:  -- CT of the abdomen and pelvis with contrast on 12/18/21 showed stable nodular densities in the mesentery measuring up to 1.5 cm.  -- DXA for bone density on 03/16/22 revealed a T-score of -1.3, classifying the patient as osteopenic/low bone mass according to [facility] criteria.   Otherwise, no significant interval history since the patient was last seen for follow-up.   ***   Allergies:  is allergic to fish oil, simvastatin, codeine, and sulfa antibiotics.  Meds: Current Outpatient Medications  Medication Sig Dispense Refill   acetaminophen (TYLENOL) 650 MG CR tablet Take 650 mg by mouth every 8 (eight) hours as needed for pain.     benazepril-hydrochlorthiazide (LOTENSIN HCT) 20-12.5 MG tablet Take 1 tablet by mouth every morning.  1   calcium carbonate (TUMS - DOSED IN MG ELEMENTAL CALCIUM) 500 MG chewable tablet Chew 2 tablets by mouth daily.     fluticasone (FLONASE) 50 MCG/ACT nasal spray Place 1 spray into both nostrils daily as needed for allergies  or rhinitis.     ibuprofen (ADVIL,MOTRIN) 200 MG tablet Take 400-600 mg by mouth 2 (two) times daily as needed for moderate pain.      magnesium oxide (MAG-OX) 400 MG tablet Take 250 mg by mouth daily. Started taking for leg cramps     Melatonin 10 MG CAPS Take by mouth.     Multiple Vitamin (MULTIVITAMIN WITH MINERALS) TABS tablet Take 1 tablet by mouth daily.     No current facility-administered medications for this encounter.    Physical Findings: The patient is in no acute distress. Patient is alert and oriented.  vitals were not taken for this visit. .  No significant changes. Lungs are clear to auscultation bilaterally. Heart has regular rate and rhythm. No palpable cervical, supraclavicular, or axillary adenopathy. Abdomen soft, non-tender, normal bowel sounds.  On pelvic examination the external genitalia were unremarkable. A speculum exam was performed. There are no mucosal lesions noted in the vaginal vault. A Pap smear was obtained of the proximal vagina. On bimanual and rectovaginal examination there were no pelvic masses appreciated. ***   Lab Findings: Lab Results  Component Value Date   WBC 7.9 07/13/2019   HGB 11.3 (L) 07/13/2019   HCT 36.9 07/13/2019   MCV 94.4 07/13/2019   PLT 237 07/13/2019    Radiographic Findings: No results found.  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  ***  Plan:  ***   *** minutes of total time was spent for this patient encounter, including preparation, face-to-face counseling with the patient and coordination of care, physical exam, and documentation of the encounter. ____________________________________  Blair Promise, PhD, MD  This document serves as a record of services personally performed by Gery Pray, MD. It was created on his behalf by Roney Mans, a trained  medical scribe. The creation of this record is based on the scribe's personal observations and the provider's statements to them. This document has been checked and approved by the attending provider.

## 2022-04-22 ENCOUNTER — Ambulatory Visit
Admission: RE | Admit: 2022-04-22 | Discharge: 2022-04-22 | Disposition: A | Discharge status: Home / Self Care | Payer: No Typology Code available for payment source | Source: Ambulatory Visit | Attending: Radiation Oncology | Admitting: Radiation Oncology

## 2022-04-22 ENCOUNTER — Other Ambulatory Visit: Payer: Self-pay

## 2022-04-22 DIAGNOSIS — Z923 Personal history of irradiation: Secondary | ICD-10-CM | POA: Insufficient documentation

## 2022-04-22 DIAGNOSIS — Z79899 Other long term (current) drug therapy: Secondary | ICD-10-CM | POA: Diagnosis not present

## 2022-04-22 DIAGNOSIS — Z8542 Personal history of malignant neoplasm of other parts of uterus: Secondary | ICD-10-CM | POA: Diagnosis present

## 2022-04-22 DIAGNOSIS — C541 Malignant neoplasm of endometrium: Secondary | ICD-10-CM

## 2022-04-22 NOTE — Progress Notes (Signed)
Connie Simmons is here today for follow up post radiation to the pelvic.  They completed their radiation on: 03/08/2018   Does the patient complain of any of the following:  Pain:no Abdominal bloating: no Diarrhea/Constipation: has diarrhea off and on depending on what she eats Nausea/Vomiting: no Vaginal Discharge: no Blood in Urine or Stool: no Urinary Issues (dysuria/incomplete emptying/ incontinence/ increased frequency/urgency): has frequency due to HCTZ.  Reports having some burning some times.  Does patient report using vaginal dilator 2-3 times a week and/or sexually active 2-3 weeks: no Post radiation skin changes: no    Additional comments if applicable:none noted.  BP (!) 129/51   Pulse 69   Temp 97.7 F (36.5 C)   Resp 20   Wt 221 lb 12.8 oz (100.6 kg)   SpO2 100%   BMI 38.33 kg/m

## 2022-05-04 ENCOUNTER — Ambulatory Visit
Admission: RE | Admit: 2022-05-04 | Discharge: 2022-05-04 | Disposition: A | Discharge status: Home / Self Care | Payer: No Typology Code available for payment source | Source: Ambulatory Visit | Attending: Family Medicine | Admitting: Family Medicine

## 2022-05-04 DIAGNOSIS — Z1231 Encounter for screening mammogram for malignant neoplasm of breast: Secondary | ICD-10-CM

## 2022-11-04 ENCOUNTER — Encounter: Payer: Self-pay | Admitting: Gynecologic Oncology

## 2022-11-04 NOTE — Progress Notes (Signed)
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 - SEE COMMENT. Microscopic Comment Sections are of a high grade carcinoma with mixed glandular, papillary and solid patterns. The histomorphology and immunostain pattern of ER negative, PR negative, p53 positive and p16 positive is compatible with a serous carcinoma. Internal departmental review obtained (Dr. Lyndon Code) with agreement. The results were called to Dr. Nelda Marseille on on 11/14/17. (MEG:kh 11/14/17)   11/23/2017 Imaging   1. Grossly thickened and heterogeneous endometrium consistent with known endometrial carcinoma. No CT findings to suggest serosal or extra uterine direct extension. No pelvic adenopathy.  2. There are worrisome small peritoneal nodules and a small mesenteric nodal mass in the root of the small bowel mesentery suspicious for metastatic disease. 3. No metastatic pulmonary nodules at the lung bases or evidence of solid abdominal organ metastatic disease.     12/01/2017 Pathology Results   1. Lymph node, sentinel, biopsy, right obturator - ONE BENIGN LYMPH NODE (0/1). 2. Peritoneum, biopsy, peritoneal nodule - NEUROENDOCRINE TUMOR. - SEE COMMENT. 3. Lymph node, sentinel, biopsy, left obturator - SEVEN BENIGN LYMPH NODES (0/7). 4. Uterus +/- tubes/ovaries, neoplastic ENDOMETRIUM. - HIGH GRADE SEROUS CARCINOMA, 3.7 CM. - CARCINOMA FOCALLY INVOLVES OUTER HALF OF MYOMETRIUM. - CERVIX, RIGHT OVARY AND BILATERAL FALLOPIAN TUBES FREE OF TUMOR. LEFT OVARY: - NEUROENDOCRINE TUMOR. - SEE COMMENT. Microscopic Comment 4.  ONCOLOGY TABLE-UTERUS, CARCINOMA OR CARCINOSARCOMA Specimen: Uterus with bilateral ovaries and fallopian tubes with right and left obturator sentinel lymph nodes and peritoneal nodule biopsy. Procedure: Hysterectomy with bilateral salpingo-oophorectomy with right and left obturator lymph node biopsies. Lymph node sampling performed: Yes. Specimen integrity: Intact with cervix and body separated. Maximum tumor size: 3.7 cm Histologic type: High grade serous carcinoma. Grade: III. Myometrial invasion: 1.2 cm where myometrium is 2 cm in thickness. Cervical stromal involvement: No. Extent of involvement of other organs: High grade serous carcinoma involving endometrium and myometrium. Lymph - vascular invasion: No  Peritoneal washings: Pending. Lymph nodes: Examined: 7 Sentinel 0 Non-sentinel 7 Total Lymph nodes with metastasis: 0 Isolated tumor cells (< 0.2 mm): 0 Micrometastasis: (> 0.2 mm and < 2.0 mm): 0 Macrometastasis: (> 2.0 mm): 0 Extracapsular extension: N/A Pelvic lymph nodes: 0 involved of 8 lymph nodes. Para-aortic lymph nodes: 0 involved of 0 lymph nodes. Other (specify involvement and site): N/A TNM code: pT1b, pN0 FIGO Stage (based on pathologic findings, needs clinical correlation): IB. COMMENT: The endometrial tumor is 3.7 cm in greatest dimension and is a high grade serous carcinoma which focally invades the outer half of the myometrium.  The left ovary shows a 1 cm nodule of neoplasm characterized by sheets of cells with uniform, round to oval nuclei with rare mitotic figures. Iimmunohistochemistry is performed and the left ovarian tumor is positive with cytokeratin AE1/AE3, CD56, chromogranin, synaptophysin and CDX-2. The tumor is negative with inhibin, WT-1, calretinin, CD99, cytokerain 20, cytokeratin 7, estrogen receptor, progesterone receptor, vimentin, and p53. The morphology and immunophenotype are consistent with a low to intermediate grade neuroendocrine tumor  and CDX-2 positivity suggests possible gastrointestinal origin. There is a similar 0.6 cm nodule in the paraovarian connective tissue and the  peritoneal nodule (part 2) has identical features.    12/01/2017 Surgery   Operation: Robotic-assisted laparoscopic total hysterectomy >250gm with bilateral salpingoophorectomy, SLN biopsy, peritoneal nodule resection   Surgeon: Donaciano Eva    Operative Findings: 14cm uterus, bulky with fibroids, grossly normal tubes and ovaries. No ascites. Multiple peritoneal nodules covering surface of pelvic peritoneum including near round ligament inserion on the left, sigmoid colon mesentery. There were also peritoneal nodules on the right peritoneal gutter adjacent to cecum which were above the pelvic brim (abdominal).     12/21/2017 Imaging   Prior hysterectomy. No evidence of residual or metastatic carcinoma within the abdomen or pelvis.  Tiny indeterminate bilateral upper lobe pulmonary nodules, likely postinflammatory in etiology although metastatic disease cannot definitely be excluded. Recommend continued follow-up by chest CT in 6 months.  Dominant 2.4 cm left thyroid lobe nodule. Consider thyroid ultrasound for further evaluation.    12/29/2017 Procedure   Successful placement of a right internal jugular approach power injectable Port-A-Cath. The catheter is ready for immediate use.   01/02/2018 - 05/08/2018 Chemotherapy   The patient had carboplatin and Taxol x 6 cycles   01/26/2018 Procedure   Well-positioned and normally functioning right IJ port catheter without evidence of complication.   02/09/2018 - 03/08/2018 Radiation Therapy   Radiation treatment dates:   02/09/2018 - 03/08/2018 Site/dose:   The patient received 30 Gy in 5 fractions directed at the vaginal cuff. Iridium 192 was the high dose rate source.    06/22/2018 Imaging   1. No findings suspicious for metastatic disease in the chest. Scattered tiny pulmonary nodules in both lungs  are all stable since 12/21/2017 chest CT, most compatible with benign etiology. 2. Mild multinodular goiter with interval growth of dominant 2.8 cm inferior left thyroid lobe nodule. Thyroid ultrasound evaluation is indicated. This follows ACR consensus guidelines: Managing Incidental Thyroid Nodules Detected on Imaging: White Paper of the ACR Incidental Thyroid Findings Committee. J Am Coll Radiol 2015; 12:143-150.  Aortic Atherosclerosis (ICD10-I70.0).    Last imaging 12/2021: CT A/P IMPRESSION: 1. Status post hysterectomy with stable nodular densities in the mesentery measuring up to 1.5 cm and not significantly changed from the prior exam. 2. Aortic atherosclerosis.  Interval History: Doing well.  Denies any abdominal pain or pelvic pain.  Reports baseline bowel bladder function.  Continues to have some urinary incontinence.  Was previously having some pelvic pain when she would walk for more than a mile, has not experienced this recently.  Denies any vaginal bleeding or discharge.  Past Medical/Surgical History: Past Medical History:  Diagnosis Date   Anemia    Dental bridge present    front upper tooth   endometrial ca dx'd 10/2017   Fibroid    History of radiation therapy 03/08/2018   vaginal brachytherapy 02/09/2018-03/08/2018      Dr Gery Pray   Hypercholesteremia    Hypertension    followed by pcp   Nocturia    PONV (postoperative nausea and vomiting)    Thyroid disease    benign goiter    Past Surgical History:  Procedure Laterality Date   CHOLECYSTECTOMY OPEN  1974   DILATION AND CURETTAGE OF UTERUS  1980s   IR CV LINE INJECTION  01/26/2018   IR FLUORO GUIDE PORT INSERTION RIGHT  12/29/2017   IR REMOVAL TUN ACCESS W/ PORT W/O FL MOD SED  07/13/2019   IR US GUIDE VASC ACCESS RIGHT  12/29/2017   ROBOTIC ASSISTED TOTAL HYSTERECTOMY WITH BILATERAL SALPINGO OOPHERECTOMY  Bilateral 12/01/2017   Procedure: XI ROBOTIC ASSISTED TOTAL HYSTERECTOMY WITH BILATERAL SALPINGO  OOPHORECTOMY WITH SENTINAL LYMPH NODES;  Surgeon: Everitt Amber, MD;  Location: [facility];  Service: Gynecology;  Laterality: Bilateral;   STRABISMUS SURGERY  age 64   unilateral    Family History  Problem Relation Age of Onset   Stroke Mother    Hypertension Mother    Prostate cancer Father    Thyroid disease Sister    Heart Problems Sister    Colon cancer Other        paternal grandfather    Social History   Socioeconomic History   Marital status: Divorced    Spouse name: Not on file   Number of children: Not on file   Years of education: Not on file   Highest education level: Not on file  Occupational History   Occupation: Haematologist  Tobacco Use   Smoking status: Never   Smokeless tobacco: Never  Vaping Use   Vaping Use: Never used  Substance and Sexual Activity   Alcohol use: No   Drug use: No   Sexual activity: Not on file  Other Topics Concern   Not on file  Social History Narrative   Not on file   Social Determinants of Health   Financial Resource Strain: Not on file  Food Insecurity: Not on file  Transportation Needs: Not on file  Physical Activity: Not on file  Stress: Not on file  Social Connections: Not on file    Current Medications:  Current Outpatient Medications:    benazepril-hydrochlorthiazide (LOTENSIN HCT) 20-12.5 MG tablet, Take 1 tablet by mouth every morning., Disp: , Rfl: 1   calcium carbonate (TUMS - DOSED IN MG ELEMENTAL CALCIUM) 500 MG chewable tablet, Chew 2 tablets by mouth daily., Disp: , Rfl:    fluticasone (FLONASE) 50 MCG/ACT nasal spray, Place 1 spray into both nostrils daily as needed for allergies or rhinitis., Disp: , Rfl:    ibuprofen (ADVIL,MOTRIN) 200 MG tablet, Take 400-600 mg by mouth 2 (two) times daily as needed for moderate pain. , Disp: , Rfl:    magnesium oxide (MAG-OX) 400 MG tablet, Take 250 mg by mouth daily. Started taking for leg cramps, Disp: , Rfl:    Melatonin 10 MG CAPS, Take by mouth., Disp: , Rfl:     Multiple Vitamin (MULTIVITAMIN WITH MINERALS) TABS tablet, Take 1 tablet by mouth daily., Disp: , Rfl:   Review of Systems: + Joint pain, back pain, muscle pain/cramp Denies appetite changes, fevers, chills, fatigue, unexplained weight changes. Denies hearing loss, neck lumps or masses, mouth sores, ringing in ears or voice changes. Denies cough or wheezing.  Denies shortness of breath. Denies chest pain or palpitations. Denies leg swelling. Denies abdominal distention, pain, blood in stools, constipation, diarrhea, nausea, vomiting, or early satiety. Denies pain with intercourse, dysuria, frequency, hematuria or incontinence. Denies hot flashes, pelvic pain, vaginal bleeding or vaginal discharge.   Denies itching, rash, or wounds. Denies dizziness, headaches, numbness or seizures. Denies swollen lymph nodes or glands, denies easy bruising or bleeding. Denies anxiety, depression, confusion, or decreased concentration.  Physical Exam: BP (!) 131/58 (BP Location: Right Arm, Patient Position: Sitting)   Pulse (!) 59   Temp (!) 97.5 F (36.4 C) (Oral)   Resp 14   Wt 222 lb 6.4 oz (100.9 kg)   SpO2 100%   BMI 38.44 kg/m  General: Alert, oriented, no acute distress. HEENT: Normocephalic, atraumatic, sclera anicteric. Chest: Clear to auscultation bilaterally.  No wheezes or rhonchi. Cardiovascular: Regular rate and rhythm, no murmurs. Abdomen: Obese, soft, nontender.  Normoactive bowel sounds.  No masses or hepatosplenomegaly appreciated.  Well-healed incisions. Extremities: Grossly normal range of motion.  Warm, well perfused.  No edema bilaterally. Skin: No rashes or lesions noted. Lymphatics: No cervical, supraclavicular, or inguinal adenopathy. GU: Normal appearing external genitalia without erythema, excoriation, or lesions.  Speculum exam reveals mildly atrophic vaginal mucosa, somewhat foreshortened, no lesions or masses.  Bimanual exam reveals cuff intact, no nodularity or masses.   Rectovaginal exam confirms exam findings.  Laboratory & Radiologic Studies: None new  Assessment & Plan: LEVONIA WOLFLEY is a 77 y.o. 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 S/p staging surgery on 12/01/17. MSI high endometrial tumor. Adjuvant carboplatin and paclitaxel x 6 and vaginal brachytherapy completed on 05/08/18. Complete clinical response on imaging.  Patient continues to do well, NED on exam today.   We will plan to continue annual CT imaging to follow-up neuroendocrine tumor.  CT ordered today to be done in April, as her grandson is graduating in March.  Otherwise would perform imaging if indicated by symptoms.    She will see Dr Sondra Come in 6 months.  At that time, she will be more 5 years out from completion of adjuvant therapy.  If no recurrence at that time, can transition to yearly visits that could be with her gynecologist.  Monna Fam this today.  Her prior OB/GYN has retired, but she will find another OB/GYN at [place] that she can see.  Discussed urinary incontinence, not very bothersome.  Also reviewed pelvic floor dysfunction noted on last CT scan.  Discussed that like floor physical therapy could help treat both of these.  I have asked the patient to call me if she like a referral either to pelvic floor physical therapy within the [facility] system or to see pelvic physical therapist at [facility] (patient thinks that there is one at the office).  20 minutes of total time was spent for this patient encounter, including preparation, face-to-face counseling with the patient and coordination of care, and documentation of the encounter.  Jeral Pinch, MD  Division of Gynecologic Oncology  Department of Obstetrics and Gynecology  [facility]

## 2022-11-05 ENCOUNTER — Encounter: Payer: Self-pay | Admitting: Gynecologic Oncology

## 2022-11-05 ENCOUNTER — Inpatient Hospital Stay: Payer: No Typology Code available for payment source | Attending: Gynecologic Oncology | Admitting: Gynecologic Oncology

## 2022-11-05 ENCOUNTER — Other Ambulatory Visit: Payer: Self-pay

## 2022-11-05 VITALS — BP 131/58 | HR 59 | Temp 97.5°F | Resp 14 | Wt 222.4 lb

## 2022-11-05 DIAGNOSIS — Z90710 Acquired absence of both cervix and uterus: Secondary | ICD-10-CM | POA: Diagnosis not present

## 2022-11-05 DIAGNOSIS — Z923 Personal history of irradiation: Secondary | ICD-10-CM | POA: Diagnosis not present

## 2022-11-05 DIAGNOSIS — Z90722 Acquired absence of ovaries, bilateral: Secondary | ICD-10-CM | POA: Diagnosis not present

## 2022-11-05 DIAGNOSIS — Z9221 Personal history of antineoplastic chemotherapy: Secondary | ICD-10-CM | POA: Diagnosis not present

## 2022-11-05 DIAGNOSIS — C541 Malignant neoplasm of endometrium: Secondary | ICD-10-CM | POA: Insufficient documentation

## 2022-11-05 DIAGNOSIS — N39498 Other specified urinary incontinence: Secondary | ICD-10-CM

## 2022-11-05 DIAGNOSIS — C7A8 Other malignant neuroendocrine tumors: Secondary | ICD-10-CM | POA: Diagnosis not present

## 2022-11-05 NOTE — Patient Instructions (Signed)
It was good to see you today.  I do not see or feel any evidence of cancer recurrence on your exam.  We will get you scheduled for CT scan in April.  I will let you know when I get the results.  As always, if you develop any new and concerning symptoms before your next visit, please call to see me sooner.  After your next visit with radiation oncology, you will be 5 years out from your treatment.  At this point, you can transition to yearly visits with your OB/GYN.

## 2023-01-26 ENCOUNTER — Other Ambulatory Visit (HOSPITAL_COMMUNITY): Payer: No Typology Code available for payment source

## 2023-02-03 ENCOUNTER — Encounter: Payer: Self-pay | Admitting: Hematology and Oncology

## 2023-02-04 ENCOUNTER — Ambulatory Visit (HOSPITAL_COMMUNITY)
Admission: RE | Admit: 2023-02-04 | Discharge: 2023-02-04 | Disposition: A | Discharge status: Home / Self Care | Payer: No Typology Code available for payment source | Source: Ambulatory Visit | Attending: Gynecologic Oncology | Admitting: Gynecologic Oncology

## 2023-02-04 DIAGNOSIS — C7A8 Other malignant neuroendocrine tumors: Secondary | ICD-10-CM | POA: Insufficient documentation

## 2023-02-04 MED ORDER — IOHEXOL 9 MG/ML PO SOLN
ORAL | Status: AC
  Filled 2023-02-04: qty 1000

## 2023-02-04 MED ORDER — IOHEXOL 300 MG/ML  SOLN
100.0000 mL | Freq: Once | INTRAMUSCULAR | Status: AC | PRN
  Administered 2023-02-04: 100 mL via INTRAVENOUS

## 2023-02-04 MED ORDER — SODIUM CHLORIDE (PF) 0.9 % IJ SOLN
INTRAMUSCULAR | Status: AC
  Filled 2023-02-04: qty 50

## 2023-02-04 MED ORDER — IOHEXOL 9 MG/ML PO SOLN
1000.0000 mL | ORAL | Status: AC
  Administered 2023-02-04: 1000 mL via ORAL

## 2023-04-21 ENCOUNTER — Encounter: Payer: Self-pay | Admitting: Hematology and Oncology

## 2023-04-27 NOTE — Progress Notes (Signed)
Radiation Oncology         [phone] ________________________________  Name: Connie Simmons MRN: [id number]  Date: 04/28/2023  DOB: [birth year]  Follow-Up Visit Note  CC: Ollen Bowl, MD  Artis Delay, MD  No diagnosis found.   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 Since her last visit, the patient followed up with Dr. Pricilla Holm on 11/05/22. During which time, the patient denied any symptoms concerning for disease recurrence and was noted as NED on examination.                 She underwent surveillance CT A/P on 02/04/23 showing: unchanged mesenteric nodule and SMA adenopathy; diverticulosis; no acute pathology.  Of note, she also had annual screening mammogram on 05/04/22, which was negative.  She denies any pelvic pain vaginal bleeding or discharge.  She has noticed some urinary frequency which she relates to her diuretic.  She occasionally will have some mild dysuria which has been intermittent.  She denies any problems with abdominal bloating.***   Allergies:  is allergic to fish oil, simvastatin, codeine, and sulfa antibiotics.  Meds: Current Outpatient Medications  Medication Sig Dispense Refill   benazepril-hydrochlorthiazide (LOTENSIN HCT) 20-12.5 MG tablet Take 1 tablet by mouth every morning.  1   calcium carbonate (TUMS - DOSED IN MG ELEMENTAL CALCIUM) 500 MG chewable tablet Chew 2 tablets by mouth daily.     fluticasone (FLONASE) 50 MCG/ACT nasal spray Place 1 spray into both nostrils daily as needed for allergies or rhinitis.     ibuprofen (ADVIL,MOTRIN) 200 MG tablet Take 400-600  mg by mouth 2 (two) times daily as needed for moderate pain.      magnesium oxide (MAG-OX) 400 MG tablet Take 250 mg by mouth daily. Started taking for leg cramps     Melatonin 10 MG CAPS Take by mouth.     Multiple Vitamin (MULTIVITAMIN WITH MINERALS) TABS tablet Take 1 tablet by mouth daily.     No current facility-administered medications for this encounter.    Physical Findings: The patient is in no acute distress. Patient is alert and oriented.  vitals were not taken for this visit. .   Lungs are clear to auscultation bilaterally. Heart has regular rate and rhythm. No palpable cervical, supraclavicular, or axillary adenopathy. Abdomen soft, non-tender, normal bowel sounds. *** On pelvic examination the external genitalia were unremarkable.  A speculum exam is performed.  Appears to be some pelvic floor laxity.  Good view of the vaginal cuff was obtained.  There are no mucosal lesions noted.  On bimanual examination the vaginal vault is somewhat foreshortened.  No palpable masses.  Rectovaginal examination confirms.  Rectal sphincter tone good.   Lab Findings: Lab Results  Component Value Date   WBC 7.9 07/13/2019   HGB 11.3 (L) 07/13/2019   HCT 36.9 07/13/2019   MCV 94.4 07/13/2019   PLT 237 07/13/2019    Radiographic Findings: No results found.  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  She does not appear to be exhibiting any long-term effects from her surgery and vaginal brachytherapy.  Recent abdominal and pelvic CT scan shows no areas of concern.  Plan: Routine follow-up in 1 year.  She will see Dr. Pricilla Holm in 6 months.***   *** minutes of total time was spent for this patient encounter, including preparation, face-to-face counseling with the patient and coordination of care, physical exam, and documentation of the  encounter. ____________________________________  Billie Lade, PhD, MD  This document serves as a record of services personally performed by Antony Blackbird, MD. It was created on his behalf by Mickie Bail, a trained medical scribe. The creation of this record is based on the scribe's personal observations and the provider's statements to them. This document has been checked and approved by the attending provider.

## 2023-04-28 ENCOUNTER — Other Ambulatory Visit: Payer: Self-pay

## 2023-04-28 ENCOUNTER — Ambulatory Visit
Admission: RE | Admit: 2023-04-28 | Discharge: 2023-04-28 | Disposition: A | Discharge status: Home / Self Care | Payer: No Typology Code available for payment source | Source: Ambulatory Visit | Attending: Radiation Oncology | Admitting: Radiation Oncology

## 2023-04-28 ENCOUNTER — Encounter: Payer: Self-pay | Admitting: Radiation Oncology

## 2023-04-28 VITALS — BP 140/65 | HR 67 | Temp 97.5°F | Resp 18 | Ht 64.0 in | Wt 227.0 lb

## 2023-04-28 DIAGNOSIS — C541 Malignant neoplasm of endometrium: Secondary | ICD-10-CM

## 2023-04-28 DIAGNOSIS — Z79899 Other long term (current) drug therapy: Secondary | ICD-10-CM | POA: Diagnosis not present

## 2023-04-28 DIAGNOSIS — Z923 Personal history of irradiation: Secondary | ICD-10-CM | POA: Diagnosis not present

## 2023-04-28 DIAGNOSIS — Z8542 Personal history of malignant neoplasm of other parts of uterus: Secondary | ICD-10-CM | POA: Insufficient documentation

## 2023-04-28 NOTE — Progress Notes (Signed)
Connie Simmons is here today for follow up post radiation to the pelvis.  They completed their radiation on:  03/08/18  Does the patient complain of any of the following:  Pain:No Abdominal bloating: No Diarrhea/Constipation: No Nausea/Vomiting: No Vaginal Discharge: No Blood in Urine or Stool: No Urinary Issues (dysuria/incomplete emptying/ incontinence/ increased frequency/urgency): No Does patient report using vaginal dilator 2-3 times a week and/or sexually active 2-3 weeks: Patient no longer using.  Post radiation skin changes: No   Additional comments if applicable:    BP (!) 140/65 (BP Location: Left Arm, Patient Position: Sitting)   Pulse 67   Temp (!) 97.5 F (36.4 C) (Temporal)   Resp 18   Ht 5\' 4"  (1.626 m)   Wt 227 lb (103 kg)   SpO2 100%   BMI 38.96 kg/m

## 2023-05-12 ENCOUNTER — Other Ambulatory Visit: Payer: Self-pay | Admitting: Internal Medicine

## 2023-05-12 DIAGNOSIS — Z1231 Encounter for screening mammogram for malignant neoplasm of breast: Secondary | ICD-10-CM

## 2023-06-27 ENCOUNTER — Ambulatory Visit: Payer: No Typology Code available for payment source

## 2023-06-28 ENCOUNTER — Encounter: Payer: Self-pay | Admitting: Obstetrics & Gynecology

## 2023-07-18 ENCOUNTER — Ambulatory Visit: Payer: No Typology Code available for payment source

## 2023-08-29 ENCOUNTER — Ambulatory Visit
Admission: RE | Admit: 2023-08-29 | Discharge: 2023-08-29 | Disposition: A | Discharge status: Home / Self Care | Payer: No Typology Code available for payment source | Source: Ambulatory Visit | Attending: Internal Medicine

## 2023-08-29 DIAGNOSIS — Z1231 Encounter for screening mammogram for malignant neoplasm of breast: Secondary | ICD-10-CM
